# Patient Record
Sex: Female | Born: 1945 | Race: White | Hispanic: No | Marital: Married | Smoking: Never smoker
Health system: Southern US, Community
[De-identification: ages and names within clinical notes are randomized; demographics above are authoritative.]

## PROBLEM LIST (undated history)

## (undated) DIAGNOSIS — G8929 Other chronic pain: Secondary | ICD-10-CM

## (undated) DIAGNOSIS — Z794 Long term (current) use of insulin: Secondary | ICD-10-CM

## (undated) DIAGNOSIS — L409 Psoriasis, unspecified: Secondary | ICD-10-CM

## (undated) DIAGNOSIS — F329 Major depressive disorder, single episode, unspecified: Secondary | ICD-10-CM

## (undated) DIAGNOSIS — F419 Anxiety disorder, unspecified: Secondary | ICD-10-CM

## (undated) DIAGNOSIS — M549 Dorsalgia, unspecified: Secondary | ICD-10-CM

## (undated) DIAGNOSIS — E785 Hyperlipidemia, unspecified: Secondary | ICD-10-CM

## (undated) DIAGNOSIS — M1990 Unspecified osteoarthritis, unspecified site: Secondary | ICD-10-CM

## (undated) DIAGNOSIS — N3000 Acute cystitis without hematuria: Secondary | ICD-10-CM

## (undated) DIAGNOSIS — E119 Type 2 diabetes mellitus without complications: Secondary | ICD-10-CM

## (undated) DIAGNOSIS — E669 Obesity, unspecified: Secondary | ICD-10-CM

## (undated) DIAGNOSIS — L509 Urticaria, unspecified: Secondary | ICD-10-CM

## (undated) DIAGNOSIS — K219 Gastro-esophageal reflux disease without esophagitis: Secondary | ICD-10-CM

## (undated) DIAGNOSIS — G4710 Hypersomnia, unspecified: Secondary | ICD-10-CM

## (undated) DIAGNOSIS — G4730 Sleep apnea, unspecified: Secondary | ICD-10-CM

## (undated) DIAGNOSIS — Z6841 Body Mass Index (BMI) 40.0 and over, adult: Secondary | ICD-10-CM

## (undated) DIAGNOSIS — G4733 Obstructive sleep apnea (adult) (pediatric): Secondary | ICD-10-CM

## (undated) DIAGNOSIS — I10 Essential (primary) hypertension: Secondary | ICD-10-CM

## (undated) DIAGNOSIS — F32A Depression, unspecified: Secondary | ICD-10-CM

## (undated) HISTORY — DX: Hyperlipidemia, unspecified: E78.5

## (undated) HISTORY — PX: CHOLECYSTECTOMY: SHX55

## (undated) HISTORY — DX: Psoriasis, unspecified: L40.9

## (undated) HISTORY — DX: Major depressive disorder, single episode, unspecified: F32.9

## (undated) HISTORY — DX: Dorsalgia, unspecified: M54.9

## (undated) HISTORY — DX: Acute cystitis without hematuria: N30.00

## (undated) HISTORY — PX: BACK SURGERY: SHX140

## (undated) HISTORY — DX: Obesity, unspecified: E66.9

## (undated) HISTORY — DX: Hypersomnia, unspecified: G47.10

## (undated) HISTORY — DX: Type 2 diabetes mellitus without complications: Z79.4

## (undated) HISTORY — DX: Urticaria, unspecified: L50.9

## (undated) HISTORY — DX: Essential (primary) hypertension: I10

## (undated) HISTORY — DX: Obstructive sleep apnea (adult) (pediatric): G47.33

## (undated) HISTORY — PX: SPINE SURGERY: SHX786

## (undated) HISTORY — PX: ABDOMINAL HYSTERECTOMY: SHX81

## (undated) HISTORY — DX: Type 2 diabetes mellitus without complications: E11.9

## (undated) HISTORY — PX: INCISIONAL BREAST BIOPSY: SHX1812

---

## 1999-07-29 ENCOUNTER — Other Ambulatory Visit: Admission: RE | Admit: 1999-07-29 | Discharge: 1999-07-29 | Payer: Self-pay | Admitting: *Deleted

## 1999-07-30 ENCOUNTER — Encounter (INDEPENDENT_AMBULATORY_CARE_PROVIDER_SITE_OTHER): Payer: Self-pay | Admitting: Specialist

## 1999-07-30 ENCOUNTER — Other Ambulatory Visit: Admission: RE | Admit: 1999-07-30 | Discharge: 1999-07-30 | Payer: Self-pay | Admitting: *Deleted

## 1999-09-15 ENCOUNTER — Inpatient Hospital Stay (HOSPITAL_COMMUNITY): Admission: RE | Admit: 1999-09-15 | Discharge: 1999-09-19 | Payer: Self-pay | Admitting: *Deleted

## 1999-09-15 ENCOUNTER — Encounter (INDEPENDENT_AMBULATORY_CARE_PROVIDER_SITE_OTHER): Payer: Self-pay

## 1999-09-15 ENCOUNTER — Encounter (INDEPENDENT_AMBULATORY_CARE_PROVIDER_SITE_OTHER): Payer: Self-pay | Admitting: Specialist

## 2000-01-15 ENCOUNTER — Other Ambulatory Visit: Admission: RE | Admit: 2000-01-15 | Discharge: 2000-01-15 | Payer: Self-pay | Admitting: *Deleted

## 2000-04-26 ENCOUNTER — Other Ambulatory Visit: Admission: RE | Admit: 2000-04-26 | Discharge: 2000-04-26 | Payer: Self-pay | Admitting: *Deleted

## 2001-01-04 ENCOUNTER — Other Ambulatory Visit: Admission: RE | Admit: 2001-01-04 | Discharge: 2001-01-04 | Payer: Self-pay | Admitting: *Deleted

## 2001-05-11 ENCOUNTER — Other Ambulatory Visit: Admission: RE | Admit: 2001-05-11 | Discharge: 2001-05-11 | Payer: Self-pay | Admitting: *Deleted

## 2001-09-26 ENCOUNTER — Other Ambulatory Visit: Admission: RE | Admit: 2001-09-26 | Discharge: 2001-09-26 | Payer: Self-pay | Admitting: *Deleted

## 2001-10-18 ENCOUNTER — Encounter: Payer: Self-pay | Admitting: Family Medicine

## 2001-10-18 ENCOUNTER — Encounter: Admission: RE | Admit: 2001-10-18 | Discharge: 2001-10-18 | Payer: Self-pay | Admitting: Family Medicine

## 2003-01-29 ENCOUNTER — Emergency Department (HOSPITAL_COMMUNITY): Admission: EM | Admit: 2003-01-29 | Discharge: 2003-01-30 | Payer: Self-pay | Admitting: Emergency Medicine

## 2004-09-12 ENCOUNTER — Emergency Department (HOSPITAL_COMMUNITY): Admission: EM | Admit: 2004-09-12 | Discharge: 2004-09-12 | Payer: Self-pay | Admitting: Emergency Medicine

## 2004-12-04 ENCOUNTER — Ambulatory Visit: Payer: Self-pay | Admitting: Gastroenterology

## 2004-12-04 ENCOUNTER — Ambulatory Visit (HOSPITAL_COMMUNITY): Admission: RE | Admit: 2004-12-04 | Discharge: 2004-12-05 | Payer: Self-pay | Admitting: General Surgery

## 2004-12-04 ENCOUNTER — Encounter (INDEPENDENT_AMBULATORY_CARE_PROVIDER_SITE_OTHER): Payer: Self-pay | Admitting: Specialist

## 2007-03-24 ENCOUNTER — Ambulatory Visit: Payer: Self-pay | Admitting: Pulmonary Disease

## 2007-03-24 DIAGNOSIS — M549 Dorsalgia, unspecified: Secondary | ICD-10-CM

## 2007-03-24 DIAGNOSIS — G4710 Hypersomnia, unspecified: Secondary | ICD-10-CM | POA: Insufficient documentation

## 2007-03-24 DIAGNOSIS — F329 Major depressive disorder, single episode, unspecified: Secondary | ICD-10-CM

## 2007-03-24 DIAGNOSIS — F3289 Other specified depressive episodes: Secondary | ICD-10-CM

## 2007-03-24 HISTORY — DX: Other specified depressive episodes: F32.89

## 2007-03-24 HISTORY — DX: Dorsalgia, unspecified: M54.9

## 2007-03-24 HISTORY — DX: Hypersomnia, unspecified: G47.10

## 2007-03-24 HISTORY — DX: Major depressive disorder, single episode, unspecified: F32.9

## 2007-03-25 ENCOUNTER — Ambulatory Visit (HOSPITAL_BASED_OUTPATIENT_CLINIC_OR_DEPARTMENT_OTHER): Admission: RE | Admit: 2007-03-25 | Discharge: 2007-03-25 | Payer: Self-pay | Admitting: Pulmonary Disease

## 2007-04-19 ENCOUNTER — Telehealth (INDEPENDENT_AMBULATORY_CARE_PROVIDER_SITE_OTHER): Payer: Self-pay | Admitting: *Deleted

## 2007-05-02 ENCOUNTER — Ambulatory Visit: Payer: Self-pay | Admitting: Pulmonary Disease

## 2007-05-10 ENCOUNTER — Telehealth (INDEPENDENT_AMBULATORY_CARE_PROVIDER_SITE_OTHER): Payer: Self-pay | Admitting: *Deleted

## 2007-05-17 ENCOUNTER — Ambulatory Visit: Payer: Self-pay | Admitting: Pulmonary Disease

## 2007-05-17 DIAGNOSIS — G4733 Obstructive sleep apnea (adult) (pediatric): Secondary | ICD-10-CM

## 2007-05-17 HISTORY — DX: Obstructive sleep apnea (adult) (pediatric): G47.33

## 2007-06-08 ENCOUNTER — Encounter: Payer: Self-pay | Admitting: Pulmonary Disease

## 2007-06-14 ENCOUNTER — Ambulatory Visit: Payer: Self-pay | Admitting: Pulmonary Disease

## 2007-07-25 ENCOUNTER — Telehealth (INDEPENDENT_AMBULATORY_CARE_PROVIDER_SITE_OTHER): Payer: Self-pay | Admitting: *Deleted

## 2007-07-26 ENCOUNTER — Encounter: Payer: Self-pay | Admitting: Pulmonary Disease

## 2009-06-29 ENCOUNTER — Emergency Department (HOSPITAL_COMMUNITY): Admission: EM | Admit: 2009-06-29 | Discharge: 2009-06-29 | Payer: Self-pay | Admitting: Emergency Medicine

## 2010-05-19 LAB — URINALYSIS, ROUTINE W REFLEX MICROSCOPIC
Glucose, UA: NEGATIVE mg/dL
Protein, ur: >300 mg/dL — AB

## 2010-05-19 LAB — DIFFERENTIAL
Basophils Absolute: 0 10*3/uL (ref 0.0–0.1)
Lymphocytes Relative: 16 % (ref 12–46)
Lymphs Abs: 2 10*3/uL (ref 0.7–4.0)
Monocytes Absolute: 1 10*3/uL (ref 0.1–1.0)
Monocytes Relative: 8 % (ref 3–12)
Neutro Abs: 9.5 10*3/uL — ABNORMAL HIGH (ref 1.7–7.7)

## 2010-05-19 LAB — URINE CULTURE: Colony Count: 35000

## 2010-05-19 LAB — URINE MICROSCOPIC-ADD ON

## 2010-05-19 LAB — CBC
Hemoglobin: 14.4 g/dL (ref 12.0–15.0)
RBC: 4.71 MIL/uL (ref 3.87–5.11)
WBC: 13 10*3/uL — ABNORMAL HIGH (ref 4.0–10.5)

## 2010-05-19 LAB — POCT I-STAT, CHEM 8
BUN: 22 mg/dL (ref 6–23)
Hemoglobin: 15.6 g/dL — ABNORMAL HIGH (ref 12.0–15.0)
Sodium: 140 mEq/L (ref 135–145)
TCO2: 21 mmol/L (ref 0–100)

## 2010-05-19 LAB — APTT: aPTT: 42 seconds — ABNORMAL HIGH (ref 24–37)

## 2010-07-14 NOTE — Procedures (Signed)
Kaitlin Berry, Kaitlin Berry              ACCOUNT NO.:  [id number]   MEDICAL RECORD NO.:  [id number]          PATIENT TYPE:  OUT   LOCATION:  SLEEP CENTER                 FACILITY:  [facility]   PHYSICIAN:  Kathee Delton, MD,FCCPDATE OF BIRTH:  06-03-45   DATE OF STUDY:  05/02/2007                            NOCTURNAL POLYSOMNOGRAM   REFERRING PHYSICIAN:   LOCATION:  Sleep lab.   REFERRING PHYSICIAN:  Danton Sewer, M.D.   INDICATIONS FOR THE STUDY:  Hypersomnia with sleep apnea.   EPWORTH SLEEPINESS SCORE:  13.   SLEEP ARCHITECTURE:  Patient had a total sleep time of 287 minutes with  very little slow wave sleep or REM noted.  Sleep onset latency was very  prolonged at 118 minutes, and REM onset was very high at 288 minutes.  Sleep efficiency was very poor at 55%.   RESPIRATORY DATA:  Patient was found to have 6 obstructive apneas and  162 hypopneas for an apnea-hypopnea index of 35 events per hour.  The  events were not positional, and there was moderate-to-loud snoring  throughout.   OXYGEN DATA:  The patient was found to have O2 desaturation as low as  81% with her obstructive events.   CARDIAC DATA:  Rare PACs were noted, but no clinically significant  arrhythmia.   MOVEMENT-PARASOMNIA:  None.   IMPRESSIONS-RECOMMENDATIONS:  1. Moderate-to-severe obstructive sleep apnea/hypopnea syndrome with      an apnea-hypopnea index of 35 events per hour and oxygen      desaturation as low as 81%.  Treatment for this degree of sleep      apnea should focus      primarily on weight loss as well as CPAP.  2. Rare premature atrial contractions but no clinically significant      arrhythmias were noted.      Kathee Delton, MD,FCCP  [place], [place] Board of Sleep  Medicine  Electronically Signed     [facility]/MEDQ  D:  05/05/2007 14:12:30  T:  05/05/2007 20:31:59  Job:  [id number]

## 2010-07-17 NOTE — Discharge Summary (Signed)
[facility]  Patient:    Kaitlin Berry, Kaitlin Berry                     MRN: [id number] Adm. Date:  [id number] Disc. Date: [id number] Attending:  Adela Lank CC:         Freddie Apley, M.D.             Domingo Pulse, M.D.                           Discharge Summary  ADMISSION DIAGNOSES: 1. Adenocarcinoma of the endometrium. 2. Stress urinary incontinence.  HISTORY:  For details of the patients history and physical, please see the note dated September 14, 1999, which was the date of admission.  Briefly, the patient is 65 years old, she has a history of simple and complex hyperplasia and an office biopsy returned with adenocarcinoma of the endometrium.  She had been complaining of stress urinary incontinence and was seen by Dr. Domingo Pulse prior to surgery.  The decision was made to remove her uterus for treatment of her endometrial cancer and if stable in the operating room, to proceed with a urethral suspension procedure.  On the day of the patients admission, she was taken to the operating room and under general anesthesia, total abdominal hysterectomy, bilateral salpingo-oophorectomy and Burch suspension were performed; Dr. Lawrence Santiago performed the latter procedure with my assistance.  Frozen section diagnosis of the uterus showed a superficially invasive tumor.  For that reason, pelvic and para-aortic node sampling was not performed.  She did have peritoneal washings and exploration performed.  There were no intraoperative complications.  HOSPITAL COURSE:  On the evening of her surgery, the patient was alert and conversant with good pain control.  She was using PCA morphine for pain control and had no evidence of nausea.  She was using her PCA adequately.  On the morning of postoperative day #1, she had no complaints.  She has had an episode of anxiety the night before which responded well to Ativan.  Her hemoglobin was stable at 10.5 on this  day.  She had an O2 saturation of 98% on 2 L.  Her oxygen was tapered to room air and she was asked to get out of bed, which she did with good results.  On postoperative day #2, she had a temperature to 100.8 and on rounds, it was 99.9.  Her lungs were clear.  There was no CVA tenderness.  Room O2 saturation was 94%.  Her wound looked good. Hemoglobin was 9.7.  That afternoon, she passed flatus and was able to begin on full liquids and p.o. pain medication.  That evening, she spiked a temperature to 101.1 but was afebrile on postoperative day #3 on morning rounds.  Her incision looked good.  Her lungs were clear.  Hemoglobin had decreased at this point to 8.6.  Pathology had returned showing a superficial invasion of her endometrial cancer.  The patient was clinically stable but with her hemoglobin drop and her temperature spike, she needed to be observed for another day.  On that day, she was seen by Dr. Amalia Hailey and decision was made to remove her Foley catheter.  She was able to void with minimal difficulty, although she did complain significant of bladder spasms.  At the time of her first in-and-out catheterization, a urine was obtained and sent for urinalysis.  This urinalysis was consistent with  a urinary tract infection. Because of her continued complaints of spasm with voiding, she was started on Macrobid one p.o. b.i.d. and Pyridium 100 mg b.i.d.  Hemoglobin on postoperative day #4 returned as stable with a hematocrit of 26%.  She was seen on the morning of postoperative day #4 by Dr. Lillette Boxer. D[place]t.  He felt that it was okay for her to be discharged and she was discharged on this morning.  She was discharged without a Foley catheter in place and with instructions regarding voiding.  An appointment has been made for her to come to my office for staple removal.  She will be seen by Dr. Amalia Hailey for evaluation of her bladder on his schedule.  DISCHARGE MEDICATIONS: 1. Tylox one  every four hours for pain. 2. Peri-Colace one daily while on Tylox to prevent constipation. 3. Vivelle 0.05 mg for estrogen replacement. 4. Chromagen one daily for anemia. 5. Macrobid one p.o. b.i.d. x 7 for urinary tract infection. 6. Pyridium 100 mg q.8h. for bladder spasm.  DISCHARGE INSTRUCTIONS:  Urine C&S is pending at the time of this discharge. Once we obtain this value, we will let her know the results and we may need to switch her antibiotics.  FINAL DIAGNOSES: 1. Stage IB adenocarcinoma of the endometrium. 2. Stress urinary incontinence.  PROCEDURES:  Total abdominal hysterectomy, bilateral salpingo-oophorectomy with surgical staging by exploration and Burch urethral suspension. DD:  09/19/98 TD:  09/22/99 Job: [id number]

## 2010-07-17 NOTE — Op Note (Signed)
Kaitlin Berry, Kaitlin Berry              ACCOUNT NO.:  [id number]   MEDICAL RECORD NO.:  [id number]          PATIENT TYPE:  OIB   LOCATION:  [id number]                         FACILITY:  [facility]   PHYSICIAN:  Gwenyth Ober, M.D.    DATE OF BIRTH:  [birth year]   DATE OF PROCEDURE:  12/04/2004  DATE OF DISCHARGE:                                 OPERATIVE REPORT   PREOPERATIVE DIAGNOSIS:  Symptomatic cholelithiasis with a dilated common  bile duct.   POSTOPERATIVE DIAGNOSIS:  Symptomatic cholelithiasis with a dilated common  bile duct.   PROCEDURE:  Laparoscopic cholecystectomy with cholangiogram.   SURGEON:  Gwenyth Ober, M.D.   ANESTHESIA:  General endotracheal.   ESTIMATED BLOOD LOSS:  Less than 20 cc.   COMPLICATIONS:  None.   CONDITION:  Stable.   FINDINGS:  The patient's cholangiogram showed a dilated common duct with  retrograde filling of the pancreatic duct, delayed flow into the duodenum,  however, it did trickle into it.  No definite intraductal filling defects.   The patient also had extensive omental adhesions to the anterior abdominal  wall and around the umbilicus making a Hassan entry technique a very  worthwhile.   INDICATIONS:  The patient is a   OPERATION:   INDICATIONS FOR OPERATION:  The patient is a 65 year old with symptomatic  cholelithiasis and comes in now for laparoscopic cholecystectomy.   DESCRIPTION OF PROCEDURE:  The patient was taken to the operating room,  placed on the table in supine position.  After an adequate endotracheal  anesthetic was administered, she was prepped and draped in usual sterile  manner exposing the midline and the abdomen.   A supraumbilical curvilinear incision was made using #11 blade and taken  down to the midline fascia.  This midline fascia was grabbed with a Kocher  clamps and an incision made in the fascia between the two clamps.  We then  bluntly dissected out into the peritoneal cavity through the fascial  incision  and cleared it for placement of a pursestring suture of O Vicryl.  Once the 0 Vicryl was in place, we used the Hasson cannula to pass into the  peritoneal cavity, confirming its position with the camera, laparoscope and  attached camera light source.  This did confirm placement.  However, there  were obvious omental attachments near the placement of the cannula.  Further  delineation required placement of the subxiphoid cannula and the 5 mm  cannulas.   We next placed a subxiphoid 12 mm cannula the direct vision.  This allowed  [place] to place the laparoscope and attached camera light source through the  subxiphoid cannula and visualize the periumbilical adhesions.  There were  multiple adhesions of omentum to the anterior abdominal wall around the  cannulae which we able to clear with electrocautery endoscopic scissors and  blunt and sharp dissection.  We were able to adequately cleared these  adhesions from the cannula site.  We did not go down into the pelvis as this  was not required for the laparoscopic gallbladder procedure.   Once we had these adhesions cleared, we  placed a cannula and the camera back  through the supraumbilical cannula and then placed two 5 mm ports in the  left upper quadrant. Once these were done, we placed the patient in reverse  Trendelenburg. The left side was tilted down and the dissection begun.   The dome of the gallbladder was wrapped using a ratcheted grasper through  the lateral-most 5-mm cannula. We retracted towards the anterior abdominal  wall and right upper quadrant.  This exposed the infundibulum which was  grasped with a second grasper and ratcheted with grasper and then exposing  the peritoneum overlying the triangle of Fallot and hepatic duodenal  triangle.  We were able to easily dissect out the cystic duct and cystic  artery.  We ligated the cystic artery proximally and distally and transected  the cystic duct.  Once it was adequately exposed  with a circumferential  window, we had an  Endoclip placed on the gallbladder side and then a  cholecysto-ducotomy made using  laparoscopic scissors.  It was through this  cholecysto-ducotomy that the [facility] catheter which had been passed through the  anterior abdominal wall was passed in order to perform the cholangiogram.  This was done with minimal difficulty and it did show an dilated common duct  and slow flow into the duodenum and retrograde filling of the pancreatic  duct and but no distal ductal filling defects.  We subsequently removed the  cannula and placed the patient back in reverse Trendelenburg left-side down,  removed the cholangiocatheter, clipped the distal cystic duct x4 and  transected it.  There was several posterior branches of the cystic artery  coming from the liver which we did Endoclip as dissected out the gallbladder  from its bed.  This was done with minimal difficulty and without entering  into the gallbladder itself.  We then used an EndoCatch bag in order to pass  the gallbladder and bring it out from the supraumbilical site.  It came out  easily and we were able to close off the site with a pursestring suture  which was then placed to hold the [facility] cannula.  We irrigated with about a  liter of saline, providing adequate hemostasis with cautery and then we  aspirated all gas and fluid.   The patient was flattened out and placed in a neutral position.  We injected  all cannula sites with 0.25% Marcaine with epinephrine.  We then closed all  the sites using running subcuticular stitch of 4-0 Vicryl.  Sterile  dressings were applied.  All needle counts, sponge counts and instrument  counts were correct.      Gwenyth Ober, M.D.  Electronically Signed     JOW/MEDQ  D:  12/04/2004  T:  12/04/2004  Job:  [id number]

## 2010-07-17 NOTE — Op Note (Signed)
[facility]  Patient:    Kaitlin Berry, Kaitlin Berry                     MRN: [id number] Proc. Date: 09/15/99 Adm. Date:  [id number] Attending:  Adela Lank CC:         Domingo Pulse, M.D.             Freddie Apley, M.D.             Lovenia Kim, M.D.                           Operative Report  PREOPERATIVE DIAGNOSIS: 1. Adenocarcinoma of the endometrium. 2. Stress urinary incontinence.  POSTOPERATIVE DIAGNOSIS: 1. Stage 1B adenocarcinoma of the endometrium. 2. Stress urinary incontinence.  OPERATION PERFORMED:  Exploratory laparotomy, total abdominal hysterectomy, bilateral salpingo-oophorectomy.  CONSULTING COSURGEON:  Domingo Pulse, M.D. for Burke Keels procedure.  ASSISTANT SURGEON:  Lovenia Kim, M.D. for the TAH/BSO.  SURGEON:  Freddie Apley, M.D.  ANESTHESIA:  INDICATIONS FOR PROCEDURE:  The patient is a 65 year old female who has a history of hyperplasia.  A recent episode of bleeding prompted an endometrial biopsy which showed a well-differentiated endometrial carcinoma.  The patient was brought to the operating room today for staging of this procedure as well as hysterectomy in attempt to cure.  OPERATIVE FINDINGS:  No ascites, no peritoneal lymph nodes, no enlarged lymph nodes.  The uterus was 10 weeks in size.  Both ovaries were normal in appearance.  There were no significant adhesions.  DESCRIPTION OF PROCEDURE:  Kaitlin Berry was brought to the operating room with an IV in place.  She received a gram of Ancef in the holding area.  Thigh high PAS stockings were placed on her lower extremities.  The patient was placed supine on the operating table and general endotracheal anesthesia was administered without difficulty.  She was then placed in a frogleg position and the anterior abdominal wall, perineum and vagina were prepped with Hibiclens solution.  The patient was draped for a midline incision.   An infraumbilical incision was made through the skin and carried down to the symphysis.  Subcutaneous tissues were separated in the midline, fascia was scored and opened.  The rectus muscles were divided in the midline.  The peritoneum was tented.  The peritoneum was opened and the anterior wall was raised.  500 cc of warm saline was instilled and collected as peritoneal washings.  Exploratory laparotomy was then performed.  Self-retaining retractor was used to retract the peritoneal walls and muscles.  At this point the fascial incision was extended inferiorly until the symphysis was reached. Warm packs were used to place the bowel contents into the upper abdomen.  It should be noted that the patient had a normal-appearing retrocecal appendix.  Long Kelly clamps were placed along the adnexal structures.  Round ligaments were identified, suture ligated and transected with cautery.  IP ligaments were skeletonized and then clamped, cut, suture and free tie ligated with 0 Vicryl suture.  The posterior broad ligament was incised bringing the ovaries up onto the uterine surface.  The uterine arteries were skeletonized.  The peritoneum overlying the lower uterine segment was incised and then separated from the lower uterine segment and cervix by sharp dissection.  The uterine arteries were doubly clamped and doubly suture ligated such that the second tie passed around the first, doubly ligating as well.  Straight Masterson clamps were used to separate the lower uterine segment and cervix from the cardinal ligament.  These sutures were Heaney ligated.  Uterosacral ligaments were divided as individual structures and held for later in the case.  We entered the vagina on the patients right.  An angle suture was placed here securing the vaginal mucosa.  The left angle was also secured and the specimen was then excised with Satinsky scissors.  Anterior and posterior vagina were held.  Later in the  case the vaginal cuff was whipped with a 0 Vicryl suture. Angled sutures were placed prior to this whipping procedure.  The anterior and posterior vaginas were brought together with a single suture.  The hysterectomy specimen was taken to the pathology lab where frozen section diagnosis revealed an invasive carcinoma but only superficially invasive.  The The peritoneal cavity was inspected and hemostasis was obtained by cautery. At this point, the side walls of the pelvis were opened and the vessels were visualized.  I looked carefully for any enlarged lymph nodes along the external iliac vessels or in the obturator fossa.  There was no evidence of nodal enlargement.  No lymph nodes were biopsied.  Upper abdominal exploration earlier in the case had shown a smooth diaphragm, smooth liver surfaces and no evidence of periaortic nodes although this was difficult to tell in a patient with this significance of obesity.  At this point in the case, Dr. Alona Bene came into the operating room and performed Burch paravaginal repair.  Please see his dictated note for the description of this procedure.  At the completion of Dr. Rayann Heman procedure, the peritoneal cavity was inspected carefully.  All the laps were removed.  Sponge and instrument counts were correct.  The fascia was closed with a running double stranded Prolene suture suturing superiorly and inferiorly and tying in the middle. Subcutaneous tissues were irrigated.  Skin staples were applied.  Estimated blood loss for this procedure was 500 cc.  Urine output was 400 cc.  Fluids were 5L of crystalloid in 250 cc of Hespan.  COMPLICATIONS:  None. DD:  09/15/99 TD:  09/16/99 Job: [id number]

## 2010-07-17 NOTE — Op Note (Signed)
[facility]  Patient:    Kaitlin Berry, Kaitlin Berry                     MRN: [id number] Proc. Date: 09/15/99 Adm. Date:  [id number] Attending:  Adela Lank CC:         Freddie Apley, M.D.                           Operative Report  PREOPERATIVE DIAGNOSES:  Stress urinary incontinence and cystocele.  POSTOPERATIVE DIAGNOSES:  Stress urinary incontinence and cystocele.  PROCEDURE:  Burch bladder suspension, paravaginal repair of cystocele, and cystoscopy.  SURGEON:  Domingo Pulse, M.D.  ASSISTANT:  Freddie Apley, M.D.  ANESTHESIA:  General.  COMPLICATIONS:  None.  DRAIN:  A 16 French Foley catheter.  BRIEF HISTORY:  This 65 year old female is scheduled to undergo a radical hysterectomy with possible node dissection.  The patient has had uterine decensus and stress urinary incontinence.  She has requested that these be repaired at the same time.  The patient is not felt to have a need for a vaginal vault suspension and sacropexy is not indicated. It is felt this can be repaired transabdominally so that a vaginal approach will not be required. The patient understands the risks and benefits of the procedure including the possibility of postoperative retention requiring prolonged catheterization, as well as the possibility that she may need to have additional surgery if she is either under or over corrected.  She gave full and informed consent.  DESCRIPTION OF PROCEDURE:  The patient underwent a radical hysterectomy and was felt to have minimally invasive tumor.  For that reason, Dr. Kathyrn Drown did not perform a standard node dissection.  The patient was found with the bdomen opened to the midline incision and the operative field dry.  The space of Retzius was developed bilaterally with blunt dissection exposing the bladder.  The patient was found to have prolapse and poor support at the level of the urethra.  The vagina had been prepped at  the beginning of the case.  A finger in the vagina was used to delineate the periurethral tissue.  Blunt dissection with a sponge stick was used to expose the glistening vaginal tissue.  On each side three bound sutures of 0 Prolene were doubly placed into this tissue.  On each side they were then sutured up at the Coopers ligament and then the outer wall of the shelving edge.  The sutures were tied up with what was felt to be an appropriate degree of tension with elevation of bladder as they were tied.  Cystoscopy was performed.  The bladder was carefully inspected.  It was free of any tumor or stones.  There did appear to be good support for the urethra and bladder neck.  The ureteral orifices did not appear to be injured in any way.  Urine was seen to efflux from each.  There was no suture material anywhere within the bladder.  The bladder was filled and with ______ there was prompt flow of urine.  The Foley catheter was reinserted and drained.  The patient was then closed by Dr. Kathyrn Drown.  She will dictate that portion of the procedure. DD:  09/15/99 TD:  09/16/99 Job: [id number]

## 2011-04-22 ENCOUNTER — Ambulatory Visit (INDEPENDENT_AMBULATORY_CARE_PROVIDER_SITE_OTHER): Payer: BC Managed Care – PPO | Admitting: Family Medicine

## 2011-04-22 VITALS — BP 168/77 | HR 75 | Temp 97.6°F | Resp 18 | Ht 60.5 in | Wt 244.8 lb

## 2011-04-22 DIAGNOSIS — R300 Dysuria: Secondary | ICD-10-CM

## 2011-04-22 DIAGNOSIS — E669 Obesity, unspecified: Secondary | ICD-10-CM

## 2011-04-22 DIAGNOSIS — N390 Urinary tract infection, site not specified: Secondary | ICD-10-CM

## 2011-04-22 HISTORY — DX: Obesity, unspecified: E66.9

## 2011-04-22 LAB — POCT UA - MICROSCOPIC ONLY: Yeast, UA: NEGATIVE

## 2011-04-22 LAB — POCT URINALYSIS DIPSTICK
Nitrite, UA: POSITIVE
Urobilinogen, UA: 1

## 2011-04-22 MED ORDER — CIPROFLOXACIN HCL 500 MG PO TABS: 500.0000 mg | ORAL_TABLET | Freq: Two times a day (BID) | ORAL | Status: AC

## 2011-04-22 NOTE — Progress Notes (Signed)
  Patient Name: Kaitlin Berry Date of Birth: [birth year] Medical Record Number: [id number] Gender: female Date of Encounter: 04/22/2011  History of Present Illness:  Kaitlin Berry is a 66 y.o. very pleasant female patient who presents with the following:  Concern re: UTI. She has noticed symptoms for the last 4 days.  Dysuria, frequency, no hematuria this time.  No fever, N/V or back pain.  Otherwise is doing well  Patient Active Problem List  Diagnoses  . DEPRESSION  . OBSTRUCTIVE SLEEP APNEA  . BACK PAIN  . HYPERSOMNIA  . Obesity   No past medical history on file. No past surgical history on file. History  Substance Use Topics  . Smoking status: Never Smoker   . Smokeless tobacco: Not on file  . Alcohol Use: Not on file   No family history on file. No Known Allergies  Medication list has been reviewed and updated.  Review of Systems: As per HPI- otherwise negative.  Physical Examination: Filed Vitals:   04/22/11 1850  BP: 168/77  Pulse: 75  Temp: 97.6 F (36.4 C)  TempSrc: Oral  Resp: 18  Height: 5' 0.5" (1.537 m)  Weight: 244 lb 12.8 oz (111.041 kg)    Body mass index is 47.02 kg/(m^2).  GEN: WDWN, NAD, Non-toxic, A & O x 3, obese HEENT: Atraumatic, Normocephalic. Neck supple. No masses, No LAD. Ears and Nose: No external deformity. CV: RRR, No M/G/R. No JVD. No thrill. No extra heart sounds. PULM: CTA B, no wheezes, crackles, rhonchi. No retractions. No resp. distress. No accessory muscle use. ABD: S, NT, ND, +BS. No rebound. No HSM.  No CVA tenderness EXTR: No c/c/e NEURO Normal gait.  PSYCH: Normally interactive. Conversant. Not depressed or anxious appearing.  Calm demeanor.    Results for orders placed in visit on 04/22/11  POCT URINALYSIS DIPSTICK      Component Value Range   Color, UA yellow     Clarity, UA cloudy     Glucose, UA negative     Bilirubin, UA negative     Ketones, UA trace     Spec Grav, UA 1.020     Blood, UA negative       pH, UA 5.0     Protein, UA trace     Urobilinogen, UA 1.0     Nitrite, UA positive     Leukocytes, UA small (1+)    POCT UA - MICROSCOPIC ONLY      Component Value Range   WBC, Ur, HPF, POC 20-30     RBC, urine, microscopic 2-4     Bacteria, U Microscopic 1+     Mucus, UA negative     Epithelial cells, urine per micros 3-6     Crystals, Ur, HPF, POC negative     Casts, Ur, LPF, POC negative     Yeast, UA negative      Assessment and Plan: 1. UTI (lower urinary tract infection)    2. Dysuria  Urinalysis Dipstick, POCT UA - Microscopic Only  3. Obesity     Cipro 500 BID for 5 days prescripbed- per patient she tolerates Cipro best.  Urine culture pending

## 2011-04-24 LAB — URINE CULTURE: Colony Count: NO GROWTH

## 2011-11-27 ENCOUNTER — Ambulatory Visit (INDEPENDENT_AMBULATORY_CARE_PROVIDER_SITE_OTHER): Payer: BC Managed Care – PPO | Admitting: Emergency Medicine

## 2011-11-27 VITALS — BP 172/108 | HR 98 | Temp 97.9°F | Resp 20 | Ht 62.0 in | Wt 264.0 lb

## 2011-11-27 DIAGNOSIS — R300 Dysuria: Secondary | ICD-10-CM

## 2011-11-27 DIAGNOSIS — N3000 Acute cystitis without hematuria: Secondary | ICD-10-CM

## 2011-11-27 DIAGNOSIS — L509 Urticaria, unspecified: Secondary | ICD-10-CM

## 2011-11-27 DIAGNOSIS — Z23 Encounter for immunization: Secondary | ICD-10-CM

## 2011-11-27 HISTORY — DX: Acute cystitis without hematuria: N30.00

## 2011-11-27 HISTORY — DX: Urticaria, unspecified: L50.9

## 2011-11-27 LAB — POCT URINALYSIS DIPSTICK
Bilirubin, UA: NEGATIVE
Clarity, UA: NEGATIVE
Nitrite, UA: POSITIVE
Protein, UA: 30

## 2011-11-27 LAB — POCT UA - MICROSCOPIC ONLY

## 2011-11-27 MED ORDER — PHENAZOPYRIDINE HCL 200 MG PO TABS: 200.0000 mg | ORAL_TABLET | Freq: Three times a day (TID) | ORAL | Status: DC | PRN

## 2011-11-27 MED ORDER — NITROFURANTOIN MONOHYD MACRO 100 MG PO CAPS: 100.0000 mg | ORAL_CAPSULE | Freq: Two times a day (BID) | ORAL | Status: DC

## 2011-11-27 NOTE — Progress Notes (Signed)
[facility] [address], [place] [address] [phone]  Date:  11/27/2011   Name:  Kaitlin Berry   DOB:  05-18-1945   MRN:  [id number]  PCP:  Woody Seller, MD    Chief Complaint: Dysuria, Urinary Frequency and Urticaria   History of Present Illness:  Kaitlin Berry is a 66 y.o. very pleasant female patient who presents with the following:  Dysuria, urgency, and frequency.  No fever or chills, nausea or vomiting, stool change, vaginal discharge or dyspareunia.  Symptoms started two days ago.  Took a left over cipro last night and today has awakened with a diffuse erythematous rash.  No shortness of breath or wheezing. No prior history of allergy.  Tolerates cipro well normally.  Patient Active Problem List  Diagnosis  . DEPRESSION  . OBSTRUCTIVE SLEEP APNEA  . BACK PAIN  . HYPERSOMNIA  . Obesity    No past medical history on file.  No past surgical history on file.  History  Substance Use Topics  . Smoking status: Never Smoker   . Smokeless tobacco: Not on file  . Alcohol Use: Not on file    No family history on file.  No Known Allergies  Medication list has been reviewed and updated.  Current Outpatient Prescriptions on File Prior to Visit  Medication Sig Dispense Refill  . carbamazepine (TEGRETOL) 200 MG tablet Take 200 mg by mouth 2 (two) times daily.      . citalopram (CELEXA) 40 MG tablet Take 40 mg by mouth daily.      . metFORMIN (GLUMETZA) 1000 MG (MOD) 24 hr tablet Take 1,000 mg by mouth daily with breakfast.      . gabapentin (NEURONTIN) 300 MG capsule Take 300 mg by mouth 1 day or 1 dose.        Review of Systems:  As per HPI, otherwise negative.    Physical Examination: Filed Vitals:   11/27/11 0847  BP: 172/108  Pulse: 98  Temp: 97.9 F (36.6 C)  Resp: 20   Filed Vitals:   11/27/11 0847  Height: 5\' 2"  (1.575 m)  Weight: 264 lb (119.75 kg)   Body mass index is 48.29 kg/(m^2). Ideal Body Weight:  Weight in (lb) to have BMI = 25: 136.4   GEN: WDWN, NAD, Non-toxic, A & O x 3 HEENT: Atraumatic, Normocephalic. Neck supple. No masses, No LAD. Ears and Nose: No external deformity. CV: RRR, No M/G/R. No JVD. No thrill. No extra heart sounds. PULM: CTA B, no wheezes, crackles, rhonchi. No retractions. No resp. distress. No accessory muscle use. ABD: S, NT, ND, +BS. No rebound. No HSM. EXTR: No c/c/e NEURO Normal gait.  PSYCH: Normally interactive. Conversant. Not depressed or anxious appearing.  Calm demeanor.  SKIN:  Urticaria  Assessment and Plan: Urticaria, likely allergy to cipro.  Benadryl  Follow up immediately for respiratory distress or failure to respond. Acute cystitis macrobid pyridium  Roselee Culver, MD  I have reviewed and agree with documentation. Robert P. Laney Pastor, M.D.

## 2011-11-30 ENCOUNTER — Telehealth: Payer: Self-pay

## 2011-11-30 MED ORDER — CIPROFLOXACIN HCL 500 MG PO TABS: 500.0000 mg | ORAL_TABLET | Freq: Two times a day (BID) | ORAL | Status: DC

## 2011-11-30 NOTE — Telephone Encounter (Signed)
The patient saw Dr. Ouida Sills on 11/27/11 for UTI and was rx'd Macrobid.  The patient states that the Macrobid is making her very sick and she would like another medication prescribed for her UTI.  Please call [phone].

## 2011-11-30 NOTE — Telephone Encounter (Signed)
Rx for Cipro sent to the pharmacy. Follow up if symptoms persist

## 2011-12-01 NOTE — Telephone Encounter (Signed)
Spoke with pt advised Rx sent to pharmacy. 

## 2012-12-18 ENCOUNTER — Ambulatory Visit (INDEPENDENT_AMBULATORY_CARE_PROVIDER_SITE_OTHER): Payer: BC Managed Care – PPO | Admitting: Internal Medicine

## 2012-12-18 ENCOUNTER — Ambulatory Visit: Payer: BC Managed Care – PPO

## 2012-12-18 VITALS — BP 162/70 | HR 64 | Temp 98.0°F | Resp 16 | Ht 64.0 in | Wt 281.0 lb

## 2012-12-18 DIAGNOSIS — M1710 Unilateral primary osteoarthritis, unspecified knee: Secondary | ICD-10-CM

## 2012-12-18 DIAGNOSIS — M25569 Pain in unspecified knee: Secondary | ICD-10-CM

## 2012-12-18 DIAGNOSIS — M25561 Pain in right knee: Secondary | ICD-10-CM

## 2012-12-18 DIAGNOSIS — M1711 Unilateral primary osteoarthritis, right knee: Secondary | ICD-10-CM

## 2012-12-18 NOTE — Progress Notes (Signed)
  Subjective:    Patient ID: Kaitlin Berry, female    DOB: Jan 12, 1946, 67 y.o.   MRN: [id number]  HPI 67 year old female, complains of pain in both knees R>L getting steadily worse over the past 2-3 yrs. Walking, stairs, and standing from the sitting position is very difficult. Pt notes little or no swelling in R knee and no redness. . Pt states she is in agony. Was seen here 2 yr ago for R knee with mild degen changes and responded to injection-did not F/U  Pt notes swelling in both ankles/edema for which she takes lasix.  Patient Active Problem List   Diagnosis Date Noted  . Acute cystitis 11/27/2011  . Urticaria 11/27/2011  . Morbid obesity 11/27/2011  . Obesity 04/22/2011  . OBSTRUCTIVE SLEEP APNEA 05/17/2007  . DEPRESSION 03/24/2007  . BACK PAIN 03/24/2007  . HYPERSOMNIA 03/24/2007    -  AODM  Current outpatient prescriptions:atorvastatin (LIPITOR) 20 MG tablet, Take 20 mg by mouth daily., Disp: , Rfl: ;  citalopram (CELEXA) 40 MG tablet, Take 40 mg by mouth daily., Disp: , Rfl: ;  lisinopril (PRINIVIL,ZESTRIL) 10 MG tablet, Take 10 mg by mouth daily., Disp: , Rfl: ;  metFORMIN (GLUMETZA) 1000 MG (MOD) 24 hr tablet, Take 1,000 mg by mouth daily with breakfast., Disp: , Rfl:  carbamazepine (TEGRETOL) 200 MG tablet, Take 200 mg by mouth 2 (two) times daily., Disp: , Rfl: ;  ciprofloxacin (CIPRO) 500 MG tablet, Take 1 tablet (500 mg total) by mouth 2 (two) times daily., Disp: 6 tablet, Rfl: 0;  gabapentin (NEURONTIN) 300 MG capsule, Take 300 mg by mouth 1 day or 1 dose., Disp: , Rfl:    Review of Systems     Objective:   Physical Exam BP 162/70  Pulse 64  Temp(Src) 98 F (36.7 C) (Oral)  Resp 16  Ht 5\' 4"  (1.626 m)  Wt 281 lb (127.461 kg)  BMI 48.21 kg/m2  SpO2 98%  Tenderness along medial joint line in both knees to light touch with pain on manip and wt bear No laxity Stress MCL nontender R Pain with general ROM R>L No effusion 2+pitting at ankle w/ good pulses     [facility] reading (PRIMARY) by  Dr. Merdith Boyd=R knee with marked degenerative changes especially medially//L with early changes  procedure=under sterile conditions Lidovcaine 3cc plus kenalog injected by lateral approach without problems and with prompt improvement in ability to walk ACE to compress 6 hours Ice and tylenol post procedure Assessment & Plan:  Pain in both knees - Plan: DG Knee 1-2 Views Left, DG Knee 1-2 Views Right  Osteoarthritis of right knee  Morbid Obesity  Needs ortho eval to plan for eventual surgery I recommend seeking approval for LAP BAND to treat her diabetes and to make it possible for successful knee surgery and she will discuss this with her PCP-Dr. Kathryne Berry

## 2014-01-29 ENCOUNTER — Other Ambulatory Visit: Payer: Self-pay | Admitting: Radiology

## 2014-08-29 ENCOUNTER — Other Ambulatory Visit: Payer: Self-pay | Admitting: Radiology

## 2014-09-16 ENCOUNTER — Ambulatory Visit: Payer: Self-pay | Admitting: Surgery

## 2014-09-16 DIAGNOSIS — D241 Benign neoplasm of right breast: Secondary | ICD-10-CM

## 2014-09-16 NOTE — H&P (Signed)
History of Present Illness Kaitlin Berry. Kaitlin Stamey MD; 09/16/2014 9:58 AM) Patient words: breast papilloma right.  The patient is a 69 year old female who presents with a complaint of nipple discharge. PCP Dr. Kathryne Eriksson Referred by Dr. Christene Slates for a right intraductal papilloma This is a 69 year old female who initially presented with several months of clear nipple discharge from the right breast in November 2015. She underwent diagnostic mammogram which was unremarkable. Ultrasound showed a finding at 12:00 anterior depth near the nipple. She underwent ultrasound-guided biopsy of this area in December 2015 which revealed benign breast tissue. She had a six-month follow-up mammogram that showed a dilated duct in the right breast consistent with a papilloma. She underwent a another core biopsy with clip placement on 08/29/14 which revealed an intraductal papilloma. She presents now to discuss lumpectomy. Other Problems (Ammie Eversole, LPN; 624THL QA348G AM) Arthritis Back Pain Bladder Problems Cancer Cholelithiasis Depression Gastroesophageal Reflux Disease Oophorectomy Bilateral. Sleep Apnea  Past Surgical History (Ammie Eversole, LPN; 624THL QA348G AM) Breast Biopsy multiple Gallbladder Surgery - Laparoscopic Hysterectomy (due to cancer) - Complete Oral Surgery Spinal Surgery - Lower Back  Diagnostic Studies History (Ammie Eversole, LPN; 624THL QA348G AM) Colonoscopy never Mammogram within last year Pap Smear >5 years ago  Allergies (Ammie Eversole, LPN; 624THL X33443 AM) Cipro *FLUOROQUINOLONES*  Medication History (Ammie Eversole, LPN; 624THL 075-GRM AM) Atorvastatin Calcium (10MG  Tablet, Oral) Active. Citalopram Hydrobromide (40MG  Tablet, Oral) Active. Januvia (100MG  Tablet, Oral) Active. Meloxicam (15MG  Tablet, Oral) Active. MetFORMIN HCl ER (500MG  Tablet ER 24HR, Oral) Active. Vitamin B 12 (100MCG Lozenge, Oral) Active. Zantac (150MG   Capsule, Oral) Active.  Social History (Ammie Eversole, LPN; 624THL QA348G AM) Alcohol use Occasional alcohol use. Caffeine use Coffee. No drug use Tobacco use Never smoker.  Family History (Ammie Eversole, LPN; 624THL QA348G AM) Alcohol Abuse Father. Arthritis Father, Mother. Cancer Brother. Cerebrovascular Accident Brother, Mother. Depression Father. Diabetes Mellitus Father. Heart Disease Brother, Father. Heart disease in female family member before age 75 Hypertension Daughter, Father. Prostate Cancer Father.  Pregnancy / Birth History [name], LPN; 624THL QA348G AM) Age at menarche 6 years. Age of menopause 29-55 Gravida 3 Maternal age 5-25 Para 3     Review of Systems (Ammie Eversole LPN; 624THL QA348G AM) General Not Present- Appetite Loss, Chills, Fatigue, Fever, Night Sweats, Weight Gain and Weight Loss. Skin Not Present- Change in Wart/Mole, Dryness, Hives, Jaundice, New Lesions, Non-Healing Wounds, Rash and Ulcer. HEENT Present- Seasonal Allergies and Wears glasses/contact lenses. Not Present- Earache, Hearing Loss, Hoarseness, Nose Bleed, Oral Ulcers, Ringing in the Ears, Sinus Pain, Sore Throat, Visual Disturbances and Yellow Eyes. Respiratory Not Present- Bloody sputum, Chronic Cough, Difficulty Breathing, Snoring and Wheezing. Breast Present- Nipple Discharge. Not Present- Breast Mass, Breast Pain and Skin Changes. Cardiovascular Not Present- Chest Pain, Difficulty Breathing Lying Down, Leg Cramps, Palpitations, Rapid Heart Rate, Shortness of Breath and Swelling of Extremities. Gastrointestinal Not Present- Abdominal Pain, Bloating, Bloody Stool, Change in Bowel Habits, Chronic diarrhea, Constipation, Difficulty Swallowing, Excessive gas, Gets full quickly at meals, Hemorrhoids, Indigestion, Nausea, Rectal Pain and Vomiting. Female Genitourinary Not Present- Frequency, Nocturia, Painful Urination, Pelvic Pain and  Urgency. Musculoskeletal Present- Joint Pain. Not Present- Back Pain, Joint Stiffness, Muscle Pain, Muscle Weakness and Swelling of Extremities. Neurological Present- Tremor. Not Present- Decreased Memory, Fainting, Headaches, Numbness, Seizures, Tingling, Trouble walking and Weakness. Psychiatric Present- Depression. Not Present- Anxiety, Bipolar, Change in Sleep Pattern, Fearful and Frequent crying. Endocrine Not Present- Cold Intolerance, Excessive Hunger, Hair Changes,  Heat Intolerance, Hot flashes and New Diabetes.  Vitals (Ammie Eversole LPN; 624THL X33443 AM) 09/16/2014 9:00 AM Weight: 276.6 lb Height: 63in Body Surface Area: 2.36 m Body Mass Index: 49 kg/m Temp.: 97.62F(Oral)  Pulse: 80 (Regular)  BP: 148/72 (Sitting, Left Arm, Standard)     Physical Exam Rodman Key K. Westly Hinnant MD; 09/16/2014 9:59 AM)  The physical exam findings are as follows: Note:WDWN in NAD HEENT: EOMI, sclera anicteric Neck: No masses, no thyromegaly Lungs: CTA bilaterally; normal respiratory effort Breasts: symmetric; no lymphadenopathy; no palpable masses in left breast; resolving hematoma and thickening around the biopsy site on the right inferior and lateral to the nipple CV: Regular rate and rhythm; no murmurs Abd: +bowel sounds, soft, non-tender, no masses Ext: Well-perfused; no edema Skin: Warm, dry; no sign of jaundice    Assessment & Plan Rodman Key K. 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 The surgical procedure has been discussed with the patient. Potential risks, benefits, alternative treatments, and expected outcomes have been explained. All of the patient's questions at this time have been answered. The likelihood of reaching the patient's treatment goal is good. The patient understand the proposed surgical procedure and wishes to proceed.  Kaitlin Berry. [name], MD,  [facility]  General/ Trauma Surgery  09/16/2014 9:59 AM

## 2014-09-30 ENCOUNTER — Encounter (HOSPITAL_BASED_OUTPATIENT_CLINIC_OR_DEPARTMENT_OTHER)
Admission: RE | Admit: 2014-09-30 | Discharge: 2014-09-30 | Disposition: A | Discharge status: Home / Self Care | Payer: Medicare HMO | Source: Ambulatory Visit | Attending: Surgery | Admitting: Surgery

## 2014-09-30 ENCOUNTER — Other Ambulatory Visit: Payer: Self-pay

## 2014-09-30 ENCOUNTER — Encounter (HOSPITAL_BASED_OUTPATIENT_CLINIC_OR_DEPARTMENT_OTHER): Payer: Self-pay | Admitting: *Deleted

## 2014-09-30 DIAGNOSIS — E119 Type 2 diabetes mellitus without complications: Secondary | ICD-10-CM | POA: Diagnosis not present

## 2014-09-30 DIAGNOSIS — Z79899 Other long term (current) drug therapy: Secondary | ICD-10-CM | POA: Diagnosis not present

## 2014-09-30 DIAGNOSIS — Z6841 Body Mass Index (BMI) 40.0 and over, adult: Secondary | ICD-10-CM | POA: Diagnosis not present

## 2014-09-30 DIAGNOSIS — I10 Essential (primary) hypertension: Secondary | ICD-10-CM | POA: Diagnosis not present

## 2014-09-30 DIAGNOSIS — D241 Benign neoplasm of right breast: Secondary | ICD-10-CM | POA: Diagnosis present

## 2014-09-30 DIAGNOSIS — K219 Gastro-esophageal reflux disease without esophagitis: Secondary | ICD-10-CM | POA: Diagnosis not present

## 2014-09-30 LAB — BASIC METABOLIC PANEL
Anion gap: 8 (ref 5–15)
BUN: 26 mg/dL — ABNORMAL HIGH (ref 6–20)
CO2: 23 mmol/L (ref 22–32)
Calcium: 9 mg/dL (ref 8.9–10.3)
Chloride: 106 mmol/L (ref 101–111)
Creatinine, Ser: 1.24 mg/dL — ABNORMAL HIGH (ref 0.44–1.00)
GFR calc non Af Amer: 44 mL/min — ABNORMAL LOW (ref >60)
GFR, EST AFRICAN AMERICAN: 51 mL/min — AB (ref >60)
Glucose, Bld: 130 mg/dL — ABNORMAL HIGH (ref 65–99)
Potassium: 5.2 mmol/L — ABNORMAL HIGH (ref 3.5–5.1)
Sodium: 137 mmol/L (ref 135–145)

## 2014-09-30 NOTE — Progress Notes (Signed)
Pt in for Pre Admission labs and EKG.  EKG obtained from Dr Redmond Pulling at [facility] from 2013 and no changes noted.  Anesthesia consult done - Dr. Deatra Canter.  OK for surgery 10/01/14.  Pt for seed placement today at 1300. Today's EKG faxed to Dr. Redmond Pulling.

## 2014-09-30 NOTE — Progress Notes (Signed)
Patient is going for seed placement today 09-30-14 and will come by here afterwards for labs, EKG and anesthesia consult due to BMI 48.2.

## 2014-09-30 NOTE — Progress Notes (Signed)
DR [name] referred to Anesthesia lab results.  Dr Rodman Comp given results no change in orders.

## 2014-10-01 ENCOUNTER — Ambulatory Visit (HOSPITAL_BASED_OUTPATIENT_CLINIC_OR_DEPARTMENT_OTHER): Payer: Medicare HMO | Admitting: Anesthesiology

## 2014-10-01 ENCOUNTER — Ambulatory Visit (HOSPITAL_BASED_OUTPATIENT_CLINIC_OR_DEPARTMENT_OTHER)
Admission: RE | Admit: 2014-10-01 | Discharge: 2014-10-01 | Disposition: A | Discharge status: Home / Self Care | Payer: Medicare HMO | Source: Ambulatory Visit | Attending: Surgery | Admitting: Surgery

## 2014-10-01 ENCOUNTER — Encounter (HOSPITAL_BASED_OUTPATIENT_CLINIC_OR_DEPARTMENT_OTHER): Payer: Self-pay | Admitting: *Deleted

## 2014-10-01 ENCOUNTER — Encounter (HOSPITAL_BASED_OUTPATIENT_CLINIC_OR_DEPARTMENT_OTHER)
Admission: RE | Disposition: A | Discharge status: Home / Self Care | Payer: Self-pay | Source: Ambulatory Visit | Attending: Surgery

## 2014-10-01 DIAGNOSIS — I10 Essential (primary) hypertension: Secondary | ICD-10-CM | POA: Insufficient documentation

## 2014-10-01 DIAGNOSIS — D241 Benign neoplasm of right breast: Secondary | ICD-10-CM | POA: Diagnosis not present

## 2014-10-01 DIAGNOSIS — Z79899 Other long term (current) drug therapy: Secondary | ICD-10-CM | POA: Insufficient documentation

## 2014-10-01 DIAGNOSIS — Z6841 Body Mass Index (BMI) 40.0 and over, adult: Secondary | ICD-10-CM | POA: Insufficient documentation

## 2014-10-01 DIAGNOSIS — E119 Type 2 diabetes mellitus without complications: Secondary | ICD-10-CM | POA: Insufficient documentation

## 2014-10-01 DIAGNOSIS — K219 Gastro-esophageal reflux disease without esophagitis: Secondary | ICD-10-CM | POA: Insufficient documentation

## 2014-10-01 HISTORY — DX: Dorsalgia, unspecified: M54.9

## 2014-10-01 HISTORY — DX: Body Mass Index (BMI) 40.0 and over, adult: Z684

## 2014-10-01 HISTORY — DX: Sleep apnea, unspecified: G47.30

## 2014-10-01 HISTORY — PX: BREAST LUMPECTOMY WITH RADIOACTIVE SEED LOCALIZATION: SHX6424

## 2014-10-01 HISTORY — DX: Morbid (severe) obesity due to excess calories: E66.01

## 2014-10-01 HISTORY — DX: Unspecified osteoarthritis, unspecified site: M19.90

## 2014-10-01 HISTORY — DX: Other chronic pain: G89.29

## 2014-10-01 HISTORY — DX: Major depressive disorder, single episode, unspecified: F32.9

## 2014-10-01 HISTORY — DX: Gastro-esophageal reflux disease without esophagitis: K21.9

## 2014-10-01 HISTORY — DX: Anxiety disorder, unspecified: F41.9

## 2014-10-01 HISTORY — DX: Depression, unspecified: F32.A

## 2014-10-01 HISTORY — DX: Type 2 diabetes mellitus without complications: E11.9

## 2014-10-01 LAB — POCT HEMOGLOBIN-HEMACUE: Hemoglobin: 12.3 g/dL (ref 12.0–15.0)

## 2014-10-01 LAB — GLUCOSE, CAPILLARY
Glucose-Capillary: 123 mg/dL — ABNORMAL HIGH (ref 65–99)
Glucose-Capillary: 113 mg/dL — ABNORMAL HIGH (ref 65–99)

## 2014-10-01 SURGERY — BREAST LUMPECTOMY WITH RADIOACTIVE SEED LOCALIZATION
Anesthesia: General | Laterality: Right

## 2014-10-01 MED ORDER — MEPERIDINE HCL 25 MG/ML IJ SOLN: 6.2500 mg | INTRAMUSCULAR | Status: DC | PRN

## 2014-10-01 MED ORDER — FENTANYL CITRATE (PF) 100 MCG/2ML IJ SOLN
50.0000 ug | INTRAMUSCULAR | Status: AC | PRN
  Administered 2014-10-01 (×4): 50 ug via INTRAVENOUS

## 2014-10-01 MED ORDER — HYDROMORPHONE HCL 1 MG/ML IJ SOLN
INTRAMUSCULAR | Status: AC
  Filled 2014-10-01: qty 1

## 2014-10-01 MED ORDER — MIDAZOLAM HCL 2 MG/2ML IJ SOLN
INTRAMUSCULAR | Status: AC
  Filled 2014-10-01: qty 2

## 2014-10-01 MED ORDER — GLYCOPYRROLATE 0.2 MG/ML IJ SOLN: 0.2000 mg | Freq: Once | INTRAMUSCULAR | Status: DC | PRN

## 2014-10-01 MED ORDER — HYDROCODONE-ACETAMINOPHEN 5-325 MG PO TABS: 1.0000 | ORAL_TABLET | ORAL | Status: DC | PRN

## 2014-10-01 MED ORDER — ONDANSETRON HCL 4 MG/2ML IJ SOLN
INTRAMUSCULAR | Status: DC | PRN
  Administered 2014-10-01: 4 mg via INTRAVENOUS

## 2014-10-01 MED ORDER — ONDANSETRON HCL 4 MG/2ML IJ SOLN: 4.0000 mg | INTRAMUSCULAR | Status: DC | PRN

## 2014-10-01 MED ORDER — MIDAZOLAM HCL 2 MG/2ML IJ SOLN
1.0000 mg | INTRAMUSCULAR | Status: DC | PRN
Start: 2014-10-01 — End: 2014-10-01
  Administered 2014-10-01 (×2): 1 mg via INTRAVENOUS

## 2014-10-01 MED ORDER — HYDROMORPHONE HCL 1 MG/ML IJ SOLN
0.2500 mg | INTRAMUSCULAR | Status: DC | PRN
  Administered 2014-10-01: 0.5 mg via INTRAVENOUS
  Administered 2014-10-01 (×3): 0.25 mg via INTRAVENOUS
  Administered 2014-10-01: 0.5 mg via INTRAVENOUS

## 2014-10-01 MED ORDER — CEFAZOLIN SODIUM 1-5 GM-% IV SOLN
INTRAVENOUS | Status: AC
  Filled 2014-10-01: qty 50

## 2014-10-01 MED ORDER — PROPOFOL 10 MG/ML IV BOLUS
INTRAVENOUS | Status: DC | PRN
  Administered 2014-10-01: 200 mg via INTRAVENOUS

## 2014-10-01 MED ORDER — FENTANYL CITRATE (PF) 100 MCG/2ML IJ SOLN
INTRAMUSCULAR | Status: AC
  Filled 2014-10-01: qty 4

## 2014-10-01 MED ORDER — BUPIVACAINE-EPINEPHRINE 0.25% -1:200000 IJ SOLN
INTRAMUSCULAR | Status: DC | PRN
  Administered 2014-10-01: 10 mL

## 2014-10-01 MED ORDER — DEXAMETHASONE SODIUM PHOSPHATE 4 MG/ML IJ SOLN
INTRAMUSCULAR | Status: DC | PRN
  Administered 2014-10-01: 4 mg via INTRAVENOUS

## 2014-10-01 MED ORDER — LIDOCAINE HCL (CARDIAC) 20 MG/ML IV SOLN
INTRAVENOUS | Status: DC | PRN
  Administered 2014-10-01: 50 mg via INTRAVENOUS

## 2014-10-01 MED ORDER — SCOPOLAMINE 1 MG/3DAYS TD PT72: 1.0000 | MEDICATED_PATCH | Freq: Once | TRANSDERMAL | Status: DC | PRN

## 2014-10-01 MED ORDER — DEXTROSE 5 % IV SOLN
3.0000 g | INTRAVENOUS | Status: AC
  Administered 2014-10-01: 3 g via INTRAVENOUS

## 2014-10-01 MED ORDER — MORPHINE SULFATE 2 MG/ML IJ SOLN: 2.0000 mg | INTRAMUSCULAR | Status: DC | PRN

## 2014-10-01 MED ORDER — LACTATED RINGERS IV SOLN
INTRAVENOUS | Status: DC
  Administered 2014-10-01 (×2): via INTRAVENOUS

## 2014-10-01 MED ORDER — BUPIVACAINE-EPINEPHRINE (PF) 0.25% -1:200000 IJ SOLN
INTRAMUSCULAR | Status: AC
  Filled 2014-10-01: qty 30

## 2014-10-01 MED ORDER — CHLORHEXIDINE GLUCONATE 4 % EX LIQD: 1.0000 "application " | Freq: Once | CUTANEOUS | Status: DC

## 2014-10-01 MED ORDER — ONDANSETRON HCL 4 MG/2ML IJ SOLN
4.0000 mg | Freq: Once | INTRAMUSCULAR | Status: DC | PRN
Start: 2014-10-01 — End: 2014-10-01

## 2014-10-01 MED ORDER — CEFAZOLIN SODIUM-DEXTROSE 2-3 GM-% IV SOLR
INTRAVENOUS | Status: AC
Start: 2014-10-01 — End: 2014-10-01
  Filled 2014-10-01: qty 50

## 2014-10-01 SURGICAL SUPPLY — 48 items
APPLIER CLIP 9.375 MED OPEN (MISCELLANEOUS) ×3
BENZOIN TINCTURE PRP APPL 2/3 (GAUZE/BANDAGES/DRESSINGS) ×3 IMPLANT
BLADE HEX COATED 2.75 (ELECTRODE) ×3 IMPLANT
BLADE SURG 15 STRL LF DISP TIS (BLADE) ×1 IMPLANT
BLADE SURG 15 STRL SS (BLADE) ×2
CANISTER SUCT 1200ML W/VALVE (MISCELLANEOUS) ×3 IMPLANT
CHLORAPREP W/TINT 26ML (MISCELLANEOUS) ×3 IMPLANT
CLIP APPLIE 9.375 MED OPEN (MISCELLANEOUS) ×1 IMPLANT
CLOSURE WOUND 1/2 X4 (GAUZE/BANDAGES/DRESSINGS) ×1
COVER BACK TABLE 60X90IN (DRAPES) ×3 IMPLANT
COVER MAYO STAND STRL (DRAPES) ×3 IMPLANT
COVER PROBE W GEL 5X96 (DRAPES) ×3 IMPLANT
DECANTER SPIKE VIAL GLASS SM (MISCELLANEOUS) IMPLANT
DEVICE DUBIN W/COMP PLATE 8390 (MISCELLANEOUS) ×3 IMPLANT
DRAPE LAPAROTOMY 100X72 PEDS (DRAPES) ×3 IMPLANT
DRAPE UTILITY XL STRL (DRAPES) ×3 IMPLANT
DRSG TEGADERM 4X4.75 (GAUZE/BANDAGES/DRESSINGS) ×3 IMPLANT
ELECT REM PT RETURN 9FT ADLT (ELECTROSURGICAL) ×3
ELECTRODE REM PT RTRN 9FT ADLT (ELECTROSURGICAL) ×1 IMPLANT
GLOVE BIO SURGEON STRL SZ7 (GLOVE) ×3 IMPLANT
GLOVE BIOGEL PI IND STRL 7.5 (GLOVE) ×3 IMPLANT
GLOVE BIOGEL PI INDICATOR 7.5 (GLOVE) ×6
GLOVE SURG SS PI 7.5 STRL IVOR (GLOVE) ×3 IMPLANT
GOWN STRL REUS W/ TWL LRG LVL3 (GOWN DISPOSABLE) ×2 IMPLANT
GOWN STRL REUS W/TWL LRG LVL3 (GOWN DISPOSABLE) ×4
KIT MARKER MARGIN INK (KITS) ×3 IMPLANT
NEEDLE HYPO 25X1 1.5 SAFETY (NEEDLE) ×3 IMPLANT
NS IRRIG 1000ML POUR BTL (IV SOLUTION) ×3 IMPLANT
PACK BASIN DAY SURGERY FS (CUSTOM PROCEDURE TRAY) ×3 IMPLANT
PENCIL BUTTON HOLSTER BLD 10FT (ELECTRODE) ×3 IMPLANT
SLEEVE SCD COMPRESS KNEE MED (MISCELLANEOUS) ×3 IMPLANT
SPONGE GAUZE 2X2 8PLY STER LF (GAUZE/BANDAGES/DRESSINGS)
SPONGE GAUZE 2X2 8PLY STRL LF (GAUZE/BANDAGES/DRESSINGS) IMPLANT
SPONGE GAUZE 4X4 12PLY STER LF (GAUZE/BANDAGES/DRESSINGS) IMPLANT
SPONGE LAP 18X18 X RAY DECT (DISPOSABLE) IMPLANT
SPONGE LAP 4X18 X RAY DECT (DISPOSABLE) ×3 IMPLANT
STRIP CLOSURE SKIN 1/2X4 (GAUZE/BANDAGES/DRESSINGS) ×2 IMPLANT
SUT MON AB 4-0 PC3 18 (SUTURE) ×3 IMPLANT
SUT SILK 2 0 SH (SUTURE) IMPLANT
SUT VIC AB 3-0 SH 27 (SUTURE) ×2
SUT VIC AB 3-0 SH 27X BRD (SUTURE) ×1 IMPLANT
SYR BULB 3OZ (MISCELLANEOUS) IMPLANT
SYR CONTROL 10ML LL (SYRINGE) ×3 IMPLANT
TOWEL OR 17X24 6PK STRL BLUE (TOWEL DISPOSABLE) ×3 IMPLANT
TOWEL OR NON WOVEN STRL DISP B (DISPOSABLE) ×3 IMPLANT
TUBE CONNECTING 20'X1/4 (TUBING) ×1
TUBE CONNECTING 20X1/4 (TUBING) ×2 IMPLANT
YANKAUER SUCT BULB TIP NO VENT (SUCTIONS) ×3 IMPLANT

## 2014-10-01 NOTE — Anesthesia Procedure Notes (Signed)
Procedure Name: LMA Insertion Date/Time: 10/01/2014 1:46 PM Performed by: Lieutenant Diego Pre-anesthesia Checklist: Patient identified, Emergency Drugs available, Suction available and Patient being monitored Patient Re-evaluated:Patient Re-evaluated prior to inductionOxygen Delivery Method: Circle System Utilized Preoxygenation: Pre-oxygenation with 100% oxygen Intubation Type: IV induction Ventilation: Mask ventilation without difficulty LMA: LMA inserted LMA Size: 4.0 Number of attempts: 1 Airway Equipment and Method: Bite block Placement Confirmation: positive ETCO2 and breath sounds checked- equal and bilateral Tube secured with: Tape Dental Injury: Teeth and Oropharynx as per pre-operative assessment

## 2014-10-01 NOTE — Anesthesia Postprocedure Evaluation (Signed)
Anesthesia Post Note  Patient: Kaitlin Berry  Procedure(s) Performed: Procedure(s) (LRB): RIGHT BREAST LUMPECTOMY WITH RADIOACTIVE SEED LOCALIZATION (Right)  Anesthesia type: general  Patient location: PACU  Post pain: Pain level controlled  Post assessment: Patient's Cardiovascular Status Stable  Last Vitals:  Filed Vitals:   10/01/14 1515  BP: 151/67  Pulse: 93  Temp:   Resp: 16    Post vital signs: Reviewed and stable  Level of consciousness: sedated  Complications: No apparent anesthesia complications

## 2014-10-01 NOTE — Transfer of Care (Signed)
Immediate Anesthesia Transfer of Care Note  Patient: Kaitlin Berry  Procedure(s) Performed: Procedure(s): RIGHT BREAST LUMPECTOMY WITH RADIOACTIVE SEED LOCALIZATION (Right)  Patient Location: PACU  Anesthesia Type:General  Level of Consciousness: awake, alert  and oriented  Airway & Oxygen Therapy: Patient Spontanous Breathing and Patient connected to face mask oxygen  Post-op Assessment: Report given to RN and Post -op Vital signs reviewed and stable  Post vital signs: Reviewed and stable  Last Vitals:  Filed Vitals:   10/01/14 1226  BP: 150/65  Pulse:   Temp:   Resp:     Complications: No apparent anesthesia complications

## 2014-10-01 NOTE — Op Note (Signed)
Preop diagnosis: Probable intraductal papilloma right breast Postop diagnosis: Same Procedure performed: Right radioactive seed localized lumpectomy Surgeon:Jniya Madara K. Anesthesia: Gen. via LMA Indications: This is a 69 year old female who presented with clear nipple discharge. She underwent a workup which included a ductogram and ultrasound which showed a mass behind her right nipple. Biopsy showed that this likely represented an intraductal papilloma. A marker was placed at the time of biopsy. She presents now for lumpectomy. A radioactive seed was placed yesterday by radiology. The presence of the seed was confirmed in the holding area using the neoprobe device.  Description of procedure: The patient is brought to the operating room and placed in a supine position on the operating room table. After an adequate level of general anesthesia was obtained, her right breast was prepped with ChloraPrep and draped in sterile fashion. A timeout was taken to ensure the proper patient and proper procedure. The neoprobe was used to identify the area of greatest activity which is just behind the lateral half of the right nipple. I used a skin marker to outline a circumareolar incision in the lower lateral part of the nipple. This area was infiltrated with 0.25% Marcaine with epinephrine. We made our incision and dissected behind the nipple. I identified the area of greatest activity. We raised 4 margins around this area. The specimen was grasped with an Allis clamp and was lifted. The specimen was removed. There was no background activity after removal of the lump.  The specimen was oriented with a paint kit. Specimen mammogram revealed the biopsy clip as well as the radioactive seed within the specimen. There is a second biopsy clip that was placed at a previous needle biopsy that was also included within the specimen. This was confirmed by radiology. We then irrigated the biopsy cavity thoroughly and inspected  for hemostasis. The wound was closed with a deep layer of 3-0 Vicryl and a subcuticular layer of 4-0 Monocryl. Steri-Strips and clean dressings were applied. The patient was then extubated and brought to the recovery room in stable condition. All sponge, initially, and needle counts are correct.  Imogene Burn. [name], MD, [facility]  General/ Trauma Surgery  10/01/2014 2:46 PM

## 2014-10-01 NOTE — Discharge Instructions (Signed)
[facility] °Office Phone Number [phone] ° °BREAST BIOPSY/ PARTIAL MASTECTOMY: POST OP INSTRUCTIONS ° °Always review your discharge instruction sheet given to you by the facility where your surgery was performed. ° °IF YOU HAVE DISABILITY OR FAMILY LEAVE FORMS, YOU MUST BRING THEM TO THE OFFICE FOR PROCESSING.  DO NOT GIVE THEM TO YOUR DOCTOR. ° °1. A prescription for pain medication may be given to you upon discharge.  Take your pain medication as prescribed, if needed.  If narcotic pain medicine is not needed, then you may take acetaminophen (Tylenol) or ibuprofen (Advil) as needed. °2. Take your usually prescribed medications unless otherwise directed °3. If you need a refill on your pain medication, please contact your pharmacy.  They will contact our office to request authorization.  Prescriptions will not be filled after 5pm or on week-ends. °4. You should eat very light the first 24 hours after surgery, such as soup, crackers, pudding, etc.  Resume your normal diet the day after surgery. °5. Most patients will experience some swelling and bruising in the breast.  Ice packs and a good support bra will help.  Swelling and bruising can take several days to resolve.  °6. It is common to experience some constipation if taking pain medication after surgery.  Increasing fluid intake and taking a stool softener will usually help or prevent this problem from occurring.  A mild laxative (Milk of Magnesia or Miralax) should be taken according to package directions if there are no bowel movements after 48 hours. °7. Unless discharge instructions indicate otherwise, you may remove your bandages 24-48 hours after surgery, and you may shower at that time.  You may have steri-strips (small skin tapes) in place directly over the incision.  These strips should be left on the skin for 7-10 days.  If your surgeon used skin glue on the incision, you may shower in 24 hours.  The glue will flake off over the  next 2-3 weeks.  Any sutures or staples will be removed at the office during your follow-up visit. °8. ACTIVITIES:  You may resume regular daily activities (gradually increasing) beginning the next day.  Wearing a good support bra or sports bra minimizes pain and swelling.  You may have sexual intercourse when it is comfortable. °a. You may drive when you no longer are taking prescription pain medication, you can comfortably wear a seatbelt, and you can safely maneuver your car and apply brakes. °b. RETURN TO WORK:  ______________________________________________________________________________________ °9. You should see your doctor in the office for a follow-up appointment approximately two weeks after your surgery.  Your doctor’s nurse will typically make your follow-up appointment when she calls you with your pathology report.  Expect your pathology report 2-3 business days after your surgery.  You may call to check if you do not hear from us after three days. °10. OTHER INSTRUCTIONS: _______________________________________________________________________________________________ _____________________________________________________________________________________________________________________________________ °_____________________________________________________________________________________________________________________________________ °_____________________________________________________________________________________________________________________________________ ° °WHEN TO CALL YOUR DOCTOR: °1. Fever over 101.0 °2. Nausea and/or vomiting. °3. Extreme swelling or bruising. °4. Continued bleeding from incision. °5. Increased pain, redness, or drainage from the incision. ° °The clinic staff is available to answer your questions during regular business hours.  Please don’t hesitate to call and ask to speak to one of the nurses for clinical concerns.  If you have a medical emergency, go to the nearest  emergency room or call 911.  A surgeon from [facility] is always on call at the hospital. ° °For further questions, please visit [email or web address]  ° ° ° °  Post Anesthesia Home Care Instructions ° °Activity: °Get plenty of rest for the remainder of the day. A responsible adult should stay with you for 24 hours following the procedure.  °For the next 24 hours, DO NOT: °-Drive a car °-Operate machinery °-Drink alcoholic beverages °-Take any medication unless instructed by your physician °-Make any legal decisions or sign important papers. ° °Meals: °Start with liquid foods such as gelatin or soup. Progress to regular foods as tolerated. Avoid greasy, spicy, heavy foods. If nausea and/or vomiting occur, drink only clear liquids until the nausea and/or vomiting subsides. Call your physician if vomiting continues. ° °Special Instructions/Symptoms: °Your throat may feel dry or sore from the anesthesia or the breathing tube placed in your throat during surgery. If this causes discomfort, gargle with warm salt water. The discomfort should disappear within 24 hours. ° °If you had a scopolamine patch placed behind your ear for the management of post- operative nausea and/or vomiting: ° °1. The medication in the patch is effective for 72 hours, after which it should be removed.  Wrap patch in a tissue and discard in the trash. Wash hands thoroughly with soap and water. °2. You may remove the patch earlier than 72 hours if you experience unpleasant side effects which may include dry mouth, dizziness or visual disturbances. °3. Avoid touching the patch. Wash your hands with soap and water after contact with the patch. °  ° °

## 2014-10-01 NOTE — Anesthesia Preprocedure Evaluation (Addendum)
Anesthesia Evaluation  Patient identified by MRN, date of birth, ID band Patient awake    Reviewed: Allergy & Precautions, NPO status , Patient's Chart, lab work & pertinent test results  History of Anesthesia Complications Negative for: history of anesthetic complications  Airway Mallampati: I  TM Distance: >3 FB Neck ROM: Full    Dental   Pulmonary  breath sounds clear to auscultation  Pulmonary exam normal       Cardiovascular hypertension, Pt. on medications Normal cardiovascular examRhythm:Regular Rate:Normal     Neuro/Psych    GI/Hepatic GERD-  Medicated and Controlled,  Endo/Other  diabetes, Type 2, Oral Hypoglycemic AgentsMorbid obesity  Renal/GU      Musculoskeletal   Abdominal (+) + obese,   Peds  Hematology   Anesthesia Other Findings   Reproductive/Obstetrics                           Anesthesia Physical Anesthesia Plan  ASA: III  Anesthesia Plan: General   Post-op Pain Management:    Induction: Intravenous  Airway Management Planned: LMA  Additional Equipment:   Intra-op Plan:   Post-operative Plan: Extubation in OR  Informed Consent: I have reviewed the patients History and Physical, chart, labs and discussed the procedure including the risks, benefits and alternatives for the proposed anesthesia with the patient or authorized representative who has indicated his/her understanding and acceptance.     Plan Discussed with: CRNA and Surgeon  Anesthesia Plan Comments:        Anesthesia Quick Evaluation

## 2014-10-01 NOTE — Interval H&P Note (Signed)
History and Physical Interval Note:  10/01/2014 12:15 PM  Kaitlin Berry  has presented today for surgery, with the diagnosis of Right Breast Papilloma  The various methods of treatment have been discussed with the patient and family. After consideration of risks, benefits and other options for treatment, the patient has consented to  Procedure(s): RIGHT BREAST LUMPECTOMY WITH RADIOACTIVE SEED LOCALIZATION (Right) as a surgical intervention .  The patient's history has been reviewed, patient examined, no change in status, stable for surgery.  I have reviewed the patient's chart and labs.  Questions were answered to the patient's satisfaction.     [name]

## 2014-10-01 NOTE — H&P (View-Only) (Signed)
History of Present Illness Kaitlin Berry. Boruch Manuele MD; 09/16/2014 9:58 AM) Patient words: breast papilloma right.  The patient is a 69 year old female who presents with a complaint of nipple discharge. PCP Dr. Kathryne Eriksson Referred by Dr. Christene Slates for a right intraductal papilloma This is a 69 year old female who initially presented with several months of clear nipple discharge from the right breast in November 2015. She underwent diagnostic mammogram which was unremarkable. Ultrasound showed a finding at 12:00 anterior depth near the nipple. She underwent ultrasound-guided biopsy of this area in December 2015 which revealed benign breast tissue. She had a six-month follow-up mammogram that showed a dilated duct in the right breast consistent with a papilloma. She underwent a another core biopsy with clip placement on 08/29/14 which revealed an intraductal papilloma. She presents now to discuss lumpectomy. Other Problems (Ammie Eversole, LPN; 624THL QA348G AM) Arthritis Back Pain Bladder Problems Cancer Cholelithiasis Depression Gastroesophageal Reflux Disease Oophorectomy Bilateral. Sleep Apnea  Past Surgical History (Ammie Eversole, LPN; 624THL QA348G AM) Breast Biopsy multiple Gallbladder Surgery - Laparoscopic Hysterectomy (due to cancer) - Complete Oral Surgery Spinal Surgery - Lower Back  Diagnostic Studies History (Ammie Eversole, LPN; 624THL QA348G AM) Colonoscopy never Mammogram within last year Pap Smear >5 years ago  Allergies (Ammie Eversole, LPN; 624THL X33443 AM) Cipro *FLUOROQUINOLONES*  Medication History (Ammie Eversole, LPN; 624THL 075-GRM AM) Atorvastatin Calcium (10MG  Tablet, Oral) Active. Citalopram Hydrobromide (40MG  Tablet, Oral) Active. Januvia (100MG  Tablet, Oral) Active. Meloxicam (15MG  Tablet, Oral) Active. MetFORMIN HCl ER (500MG  Tablet ER 24HR, Oral) Active. Vitamin B 12 (100MCG Lozenge, Oral) Active. Zantac (150MG   Capsule, Oral) Active.  Social History (Ammie Eversole, LPN; 624THL QA348G AM) Alcohol use Occasional alcohol use. Caffeine use Coffee. No drug use Tobacco use Never smoker.  Family History (Ammie Eversole, LPN; 624THL QA348G AM) Alcohol Abuse Father. Arthritis Father, Mother. Cancer Brother. Cerebrovascular Accident Brother, Mother. Depression Father. Diabetes Mellitus Father. Heart Disease Brother, Father. Heart disease in female family member before age 15 Hypertension Daughter, Father. Prostate Cancer Father.  Pregnancy / Birth History [name], LPN; 624THL QA348G AM) Age at menarche 58 years. Age of menopause 9-55 Gravida 3 Maternal age 34-25 Para 3     Review of Systems (Ammie Eversole LPN; 624THL QA348G AM) General Not Present- Appetite Loss, Chills, Fatigue, Fever, Night Sweats, Weight Gain and Weight Loss. Skin Not Present- Change in Wart/Mole, Dryness, Hives, Jaundice, New Lesions, Non-Healing Wounds, Rash and Ulcer. HEENT Present- Seasonal Allergies and Wears glasses/contact lenses. Not Present- Earache, Hearing Loss, Hoarseness, Nose Bleed, Oral Ulcers, Ringing in the Ears, Sinus Pain, Sore Throat, Visual Disturbances and Yellow Eyes. Respiratory Not Present- Bloody sputum, Chronic Cough, Difficulty Breathing, Snoring and Wheezing. Breast Present- Nipple Discharge. Not Present- Breast Mass, Breast Pain and Skin Changes. Cardiovascular Not Present- Chest Pain, Difficulty Breathing Lying Down, Leg Cramps, Palpitations, Rapid Heart Rate, Shortness of Breath and Swelling of Extremities. Gastrointestinal Not Present- Abdominal Pain, Bloating, Bloody Stool, Change in Bowel Habits, Chronic diarrhea, Constipation, Difficulty Swallowing, Excessive gas, Gets full quickly at meals, Hemorrhoids, Indigestion, Nausea, Rectal Pain and Vomiting. Female Genitourinary Not Present- Frequency, Nocturia, Painful Urination, Pelvic Pain and  Urgency. Musculoskeletal Present- Joint Pain. Not Present- Back Pain, Joint Stiffness, Muscle Pain, Muscle Weakness and Swelling of Extremities. Neurological Present- Tremor. Not Present- Decreased Memory, Fainting, Headaches, Numbness, Seizures, Tingling, Trouble walking and Weakness. Psychiatric Present- Depression. Not Present- Anxiety, Bipolar, Change in Sleep Pattern, Fearful and Frequent crying. Endocrine Not Present- Cold Intolerance, Excessive Hunger, Hair Changes,  Heat Intolerance, Hot flashes and New Diabetes.  Vitals (Ammie Eversole LPN; 624THL X33443 AM) 09/16/2014 9:00 AM Weight: 276.6 lb Height: 63in Body Surface Area: 2.36 m Body Mass Index: 49 kg/m Temp.: 97.22F(Oral)  Pulse: 80 (Regular)  BP: 148/72 (Sitting, Left Arm, Standard)     Physical Exam Rodman Key K. Ravin Denardo MD; 09/16/2014 9:59 AM)  The physical exam findings are as follows: Note:WDWN in NAD HEENT: EOMI, sclera anicteric Neck: No masses, no thyromegaly Lungs: CTA bilaterally; normal respiratory effort Breasts: symmetric; no lymphadenopathy; no palpable masses in left breast; resolving hematoma and thickening around the biopsy site on the right inferior and lateral to the nipple CV: Regular rate and rhythm; no murmurs Abd: +bowel sounds, soft, non-tender, no masses Ext: Well-perfused; no edema Skin: Warm, dry; no sign of jaundice    Assessment & Plan Rodman Key K. 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 The surgical procedure has been discussed with the patient. Potential risks, benefits, alternative treatments, and expected outcomes have been explained. All of the patient's questions at this time have been answered. The likelihood of reaching the patient's treatment goal is good. The patient understand the proposed surgical procedure and wishes to proceed.  Kaitlin Berry. [name], MD,  [facility]  General/ Trauma Surgery  09/16/2014 9:59 AM

## 2014-10-02 ENCOUNTER — Encounter (HOSPITAL_BASED_OUTPATIENT_CLINIC_OR_DEPARTMENT_OTHER): Payer: Self-pay | Admitting: Surgery

## 2016-11-15 ENCOUNTER — Other Ambulatory Visit: Payer: Self-pay | Admitting: Physician Assistant

## 2016-11-15 ENCOUNTER — Ambulatory Visit
Admission: RE | Admit: 2016-11-15 | Discharge: 2016-11-15 | Disposition: A | Discharge status: Home / Self Care | Payer: Medicare HMO | Source: Ambulatory Visit | Attending: Physician Assistant | Admitting: Physician Assistant

## 2016-11-15 DIAGNOSIS — R0602 Shortness of breath: Secondary | ICD-10-CM

## 2016-11-15 DIAGNOSIS — R0600 Dyspnea, unspecified: Secondary | ICD-10-CM

## 2016-11-15 NOTE — Progress Notes (Deleted)
Cardiology Office Note:    Date:  11/15/2016   ID:  Kaitlin Berry, DOB 1945-12-25, MRN [id number]  PCP:  Christain Sacramento, MD  Cardiologist:  Fransico Him, MD   Referring MD: Christain Sacramento, MD   No chief complaint on file.   History of Present Illness:    Kaitlin Berry is a 71 y.o. female with a hx of ***  Past Medical History:  Diagnosis Date  . Anxiety   . Arthritis    rt knee  . Chronic back pain   . Depression   . Diabetes mellitus without complication   . GERD (gastroesophageal reflux disease)   . Hyperlipidemia   . Hypertension   . Morbid obesity with BMI of 45.0-49.9, adult   . Sleep apnea    CPAP    Past Surgical History:  Procedure Laterality Date  . ABDOMINAL HYSTERECTOMY    . BACK SURGERY     lumbar  . BREAST LUMPECTOMY WITH RADIOACTIVE SEED LOCALIZATION Right 10/01/2014   Procedure: RIGHT BREAST LUMPECTOMY WITH RADIOACTIVE SEED LOCALIZATION;  Surgeon: Donnie Mesa, MD;  Location: [facility];  Service: General;  Laterality: Right;  . CHOLECYSTECTOMY    . INCISIONAL BREAST BIOPSY     multiple  . SPINE SURGERY      Current Medications: No outpatient prescriptions have been marked as taking for the 11/16/16 encounter (Appointment) with Sueanne Margarita, MD.     Allergies:   Ciprofloxacin hcl   Social History   Social History  . Marital status: Married    Spouse name: N/A  . Number of children: N/A  . Years of education: N/A   Social History Main Topics  . Smoking status: Never Smoker  . Smokeless tobacco: Not on file  . Alcohol use Yes     Comment: social  . Drug use: No  . Sexual activity: Not on file   Other Topics Concern  . Not on file   Social History Narrative  . No narrative on file     Family History: The patient's ***family history is not on file.  ROS:   Please see the history of present illness.    *** All other systems reviewed and are negative.  EKGs/Labs/Other Studies Reviewed:    The following  studies were reviewed today: ***  EKG:  EKG is *** ordered today.  The ekg ordered today demonstrates ***  Recent Labs: No results found for requested labs within last 8760 hours.   Recent Lipid Panel No results found for: CHOL, TRIG, HDL, CHOLHDL, VLDL, LDLCALC, LDLDIRECT  Physical Exam:    VS:  There were no vitals taken for this visit.    Wt Readings from Last 3 Encounters:  10/01/14 274 lb (124.3 kg)  12/18/12 281 lb (127.5 kg)  11/27/11 264 lb (119.7 kg)     GEN: *** Well nourished, well developed in no acute distress HEENT: Normal NECK: No JVD; No carotid bruits LYMPHATICS: No lymphadenopathy CARDIAC: ***RRR, no murmurs, rubs, gallops RESPIRATORY:  Clear to auscultation without rales, wheezing or rhonchi  ABDOMEN: Soft, non-tender, non-distended MUSCULOSKELETAL:  No edema; No deformity  SKIN: Warm and dry NEUROLOGIC:  Alert and oriented x 3 PSYCHIATRIC:  Normal affect   ASSESSMENT:    No diagnosis found. PLAN:    In order of problems listed above:  1. ***   Medication Adjustments/Labs and Tests Ordered: Current medicines are reviewed at length with the patient today.  Concerns regarding medicines are outlined above.  No orders of the defined types were placed in this encounter.  No orders of the defined types were placed in this encounter.   Signed, Fransico Him, MD  11/15/2016 5:19 PM    [place]

## 2016-11-16 ENCOUNTER — Ambulatory Visit (INDEPENDENT_AMBULATORY_CARE_PROVIDER_SITE_OTHER): Payer: Medicare HMO | Admitting: Cardiology

## 2016-11-16 ENCOUNTER — Other Ambulatory Visit: Payer: Self-pay | Admitting: Physician Assistant

## 2016-11-16 ENCOUNTER — Ambulatory Visit
Admission: RE | Admit: 2016-11-16 | Discharge: 2016-11-16 | Disposition: A | Discharge status: Home / Self Care | Payer: Medicare HMO | Source: Ambulatory Visit | Attending: Physician Assistant | Admitting: Physician Assistant

## 2016-11-16 VITALS — BP 132/74 | HR 95 | Ht 64.0 in | Wt 273.1 lb

## 2016-11-16 DIAGNOSIS — R079 Chest pain, unspecified: Secondary | ICD-10-CM | POA: Diagnosis not present

## 2016-11-16 DIAGNOSIS — R0602 Shortness of breath: Secondary | ICD-10-CM

## 2016-11-16 DIAGNOSIS — I509 Heart failure, unspecified: Secondary | ICD-10-CM

## 2016-11-16 DIAGNOSIS — R7989 Other specified abnormal findings of blood chemistry: Secondary | ICD-10-CM

## 2016-11-16 MED ORDER — IOPAMIDOL (ISOVUE-370) INJECTION 76%
75.0000 mL | Freq: Once | INTRAVENOUS | Status: AC | PRN
  Administered 2016-11-16: 75 mL via INTRAVENOUS

## 2016-11-16 NOTE — Patient Instructions (Signed)
Schedule Lexiscan Myoview   Schedule Echo    Your physician recommends that you schedule a follow-up appointment in: 3 weeks with Dr.Turner or PA.

## 2016-11-16 NOTE — Progress Notes (Signed)
Cardiology Office Note    Date:  11/17/2016   ID:  Kaitlin Berry, DOB 08-01-1945, MRN [id number]  PCP:  Christain Sacramento, MD  Cardiologist:  Fransico Him, MD   No chief complaint on file.   History of Present Illness:  Kaitlin Berry is a 71 y.o. female who is being seen today for the evaluation of SOB and LE edema at the request of Christain Sacramento, MD.  This is a 71yo female with a hx of morbid obesity, chronic back pain, DM, HTN and hyperlipidemia who is referred here today for evaluation of chest pain, SOB and LE edema.  She says that SOB has been getting worse over the past year.  She says that a few days ago she felt very bad with diarrhea from her IBS.  She took Imodium immediately and went to be but since then has had very bad problems with SOB including PND and orthopnea even using her CPAP machine.  Her symptoms lasted through the weekend but yesterday felt better when she got up but then through the day she got more SOB and she noticed that her chest was tight.  She was given Lasix which significantly improved her breathing.  She says that the tightness in her chest started mid week last week and was constant until she got the lasix.  She continues to have a fullness in her chest and abdomen and has noticed significant swelling in her legs. Chest CT angio showed no evidence of PE and no edema.  She says that her BNP was elevated at 750.  Prior to this episode she denies any prior CP. She has never smoked.  Her father had CHF and DM and died from prostate CA.  Her brother had 2 MIs and died of lung CA and he was a smoker.   Past Medical History:  Diagnosis Date  . Acute cystitis 11/27/2011  . Anxiety   . Arthritis    rt knee  . BACK PAIN 03/24/2007   Qualifier: Diagnosis of  By: Gwenette Greet MD, Armando Reichert   . Chronic back pain   . Depression   . DEPRESSION 03/24/2007   Qualifier: Diagnosis of  By: Gwenette Greet MD, Armando Reichert   . Diabetes mellitus without complication ([place])   . GERD  (gastroesophageal reflux disease)   . Hyperlipidemia   . HYPERSOMNIA 03/24/2007   Qualifier: Diagnosis of  By: Gwenette Greet MD, Armando Reichert   . Hypertension   . Morbid obesity with BMI of 45.0-49.9, adult ([place])   . Obesity 04/22/2011  . OBSTRUCTIVE SLEEP APNEA 05/17/2007   Qualifier: Diagnosis of  By: Gwenette Greet MD, Armando Reichert   . Sleep apnea    CPAP  . Urticaria 11/27/2011    Past Surgical History:  Procedure Laterality Date  . ABDOMINAL HYSTERECTOMY    . BACK SURGERY     lumbar  . BREAST LUMPECTOMY WITH RADIOACTIVE SEED LOCALIZATION Right 10/01/2014   Procedure: RIGHT BREAST LUMPECTOMY WITH RADIOACTIVE SEED LOCALIZATION;  Surgeon: Donnie Mesa, MD;  Location: [facility];  Service: General;  Laterality: Right;  . CHOLECYSTECTOMY    . INCISIONAL BREAST BIOPSY     multiple  . SPINE SURGERY      Current Medications: Current Meds  Medication Sig  . atorvastatin (LIPITOR) 20 MG tablet Take 20 mg by mouth daily.  . citalopram (CELEXA) 40 MG tablet Take 40 mg by mouth daily.  . furosemide (LASIX) 40 MG tablet Take 40 mg by mouth daily.  Marland Kitchen  HYDROcodone-acetaminophen (NORCO/VICODIN) 5-325 MG per tablet Take 1 tablet by mouth every 4 (four) hours as needed.  Marland Kitchen lisinopril (PRINIVIL,ZESTRIL) 10 MG tablet Take 10 mg by mouth daily.  . meloxicam (MOBIC) 15 MG tablet Take 15 mg by mouth daily.  . metFORMIN (GLUCOPHAGE) 500 MG tablet Take 500 mg by mouth 4 (four) times daily.  . ranitidine (ZANTAC) 150 MG tablet Take 150 mg by mouth 2 (two) times daily.  . sitaGLIPtin (JANUVIA) 100 MG tablet Take 100 mg by mouth daily.    Allergies:   Atorvastatin; Ciprofloxacin; and Ciprofloxacin hcl   Social History   Social History  . Marital status: Married    Spouse name: N/A  . Number of children: N/A  . Years of education: N/A   Social History Main Topics  . Smoking status: Never Smoker  . Smokeless tobacco: Never Used  . Alcohol use Yes     Comment: social  . Drug use: No  . Sexual activity:  Not Asked   Other Topics Concern  . None   Social History Narrative  . None     Family History:  The patient's family history includes Alzheimer's disease in her mother; Cancer in her father; Diabetes in her brother and father; Heart attack in her brother; Heart disease in her father; Lung cancer in her brother; Stroke in her brother.   ROS:   Please see the history of present illness.    ROS All other systems reviewed and are negative.  No flowsheet data found.     PHYSICAL EXAM:   VS:  BP 132/74   Pulse 95   Ht 5\' 4"  (1.626 m)   Wt 273 lb 1.9 oz (123.9 kg)   SpO2 96%   BMI 46.88 kg/m    GEN: Well nourished, well developed, in no acute distress  HEENT: normal  Neck: no JVD, carotid bruits, or masses Cardiac: RRR; no murmurs, rubs, or gallops,no edema.  Intact distal pulses bilaterally.  Respiratory:  clear to auscultation bilaterally, normal work of breathing GI: soft, nontender, nondistended, + BS MS: no deformity or atrophy  Skin: warm and dry, no rash Neuro:  Alert and Oriented x 3, Strength and sensation are intact Psych: euthymic mood, full affect  Wt Readings from Last 3 Encounters:  11/16/16 273 lb 1.9 oz (123.9 kg)  10/01/14 274 lb (124.3 kg)  12/18/12 281 lb (127.5 kg)      Studies/Labs Reviewed:   EKG:  EKG is not ordered today.  The ekg at PCP office showed NSR with LAFb  Recent Labs: No results found for requested labs within last 8760 hours.   Lipid Panel No results found for: CHOL, TRIG, HDL, CHOLHDL, VLDL, LDLCALC, LDLDIRECT  Additional studies/ records that were reviewed today include:  Office notes from PCP    ASSESSMENT:    1. Shortness of breath   2. Chest pain, unspecified type   3. Acute congestive heart failure, unspecified heart failure type ([place])      PLAN:  In order of problems listed above:  1.  SOB - I suspect this is multifactorial due to morbid obesity, sedentary lifestyle and probable diastolic CHF.  She recently  was told her BNP was in the 700's and started on Lasix with improvement in her SOB.  She still is markedly SOB when she ambulates but difficult to determine how much is related to her morbid obesity.  I will repeat her BNP and get a 2D echo to assess LVF and diastolic  function.  2.  Chest tightness that is likely related to CHF as the pain improved with diuresis.  She does have CRF which includes morbid obesity, HTN and strong family history of CAD.  I will get a 2 day stress myoview to rule out ischemia.    3.  Acute CHF - likely diastolic.  She will continue on Lasix and I will get a echo to evaluate further.   4.  HTN - her BP is adequately controlled on current meds. She will continue on ACE I.    Medication Adjustments/Labs and Tests Ordered: Current medicines are reviewed at length with the patient today.  Concerns regarding medicines are outlined above.  Medication changes, Labs and Tests ordered today are listed in the Patient Instructions below.  Patient Instructions  Schedule Lexiscan Myoview   Schedule Echo    Your physician recommends that you schedule a follow-up appointment in: 3 weeks with Dr.Turner or PA.     Signed, Fransico Him, MD  11/17/2016 10:23 AM    [address] [place], [place], [place]  [address] Phone: [phone]; Fax: [phone]

## 2016-11-17 DIAGNOSIS — R0609 Other forms of dyspnea: Secondary | ICD-10-CM | POA: Insufficient documentation

## 2016-11-17 DIAGNOSIS — R072 Precordial pain: Secondary | ICD-10-CM | POA: Insufficient documentation

## 2016-11-17 DIAGNOSIS — R0602 Shortness of breath: Secondary | ICD-10-CM | POA: Insufficient documentation

## 2016-11-17 DIAGNOSIS — R079 Chest pain, unspecified: Secondary | ICD-10-CM | POA: Insufficient documentation

## 2016-11-17 DIAGNOSIS — I509 Heart failure, unspecified: Secondary | ICD-10-CM | POA: Insufficient documentation

## 2016-11-23 ENCOUNTER — Institutional Professional Consult (permissible substitution): Payer: Medicare HMO | Admitting: Pulmonary Disease

## 2016-11-24 ENCOUNTER — Telehealth (HOSPITAL_COMMUNITY): Payer: Self-pay | Admitting: *Deleted

## 2016-11-24 NOTE — Telephone Encounter (Signed)
Patient given detailed instructions per Myocardial Perfusion Study Information Sheet for the test on 11/29/16. Patient notified to arrive 15 minutes early and that it is imperative to arrive on time for appointment to keep from having the test rescheduled.  If you need to cancel or reschedule your appointment, please call the office within 24 hours of your appointment. . Patient verbalized understanding. Kirstie Peri

## 2016-11-26 ENCOUNTER — Encounter: Payer: Self-pay | Admitting: Physician Assistant

## 2016-11-29 ENCOUNTER — Ambulatory Visit (HOSPITAL_BASED_OUTPATIENT_CLINIC_OR_DEPARTMENT_OTHER): Payer: Medicare HMO

## 2016-11-29 ENCOUNTER — Ambulatory Visit (HOSPITAL_COMMUNITY): Payer: Medicare HMO | Attending: Internal Medicine

## 2016-11-29 ENCOUNTER — Other Ambulatory Visit: Payer: Self-pay

## 2016-11-29 DIAGNOSIS — I10 Essential (primary) hypertension: Secondary | ICD-10-CM | POA: Diagnosis not present

## 2016-11-29 DIAGNOSIS — G8929 Other chronic pain: Secondary | ICD-10-CM | POA: Diagnosis not present

## 2016-11-29 DIAGNOSIS — R0602 Shortness of breath: Secondary | ICD-10-CM

## 2016-11-29 DIAGNOSIS — M549 Dorsalgia, unspecified: Secondary | ICD-10-CM | POA: Diagnosis not present

## 2016-11-29 DIAGNOSIS — E119 Type 2 diabetes mellitus without complications: Secondary | ICD-10-CM | POA: Insufficient documentation

## 2016-11-29 DIAGNOSIS — E785 Hyperlipidemia, unspecified: Secondary | ICD-10-CM | POA: Insufficient documentation

## 2016-11-29 DIAGNOSIS — R079 Chest pain, unspecified: Secondary | ICD-10-CM | POA: Diagnosis not present

## 2016-11-29 HISTORY — PX: NM MYOVIEW LTD: HXRAD82

## 2016-11-29 HISTORY — PX: TRANSTHORACIC ECHOCARDIOGRAM: SHX275

## 2016-11-29 MED ORDER — REGADENOSON 0.4 MG/5ML IV SOLN
0.4000 mg | Freq: Once | INTRAVENOUS | Status: AC
  Administered 2016-11-29: 0.4 mg via INTRAVENOUS

## 2016-11-29 MED ORDER — TECHNETIUM TC 99M TETROFOSMIN IV KIT
33.0000 | PACK | Freq: Once | INTRAVENOUS | Status: AC | PRN
  Administered 2016-11-29: 33 via INTRAVENOUS
  Filled 2016-11-29: qty 33

## 2016-11-30 ENCOUNTER — Ambulatory Visit (HOSPITAL_COMMUNITY): Payer: Medicare HMO | Attending: Cardiology

## 2016-11-30 LAB — MYOCARDIAL PERFUSION IMAGING
CHL CUP NUCLEAR SDS: 2
LHR: 0.42
LV sys vol: 19 mL
LVDIAVOL: 69 mL (ref 46–106)
NUC STRESS TID: 0.85
Peak HR: 111 {beats}/min
Rest HR: 88 {beats}/min
SRS: 2
SSS: 3

## 2016-11-30 MED ORDER — TECHNETIUM TC 99M TETROFOSMIN IV KIT
30.0000 | PACK | Freq: Once | INTRAVENOUS | Status: AC | PRN
  Administered 2016-11-30: 30 via INTRAVENOUS
  Filled 2016-11-30: qty 30

## 2016-12-02 ENCOUNTER — Telehealth: Payer: Self-pay | Admitting: Internal Medicine

## 2016-12-02 ENCOUNTER — Telehealth: Payer: Self-pay | Admitting: *Deleted

## 2016-12-02 NOTE — Telephone Encounter (Signed)
-----   Message from Sueanne Margarita, MD sent at 12/02/2016  9:48 AM EDT ----- Please let patient know that stress test was fine

## 2016-12-02 NOTE — Telephone Encounter (Signed)
Patient informed. 

## 2016-12-02 NOTE — Telephone Encounter (Signed)
New message     Patient returning call. Please call

## 2016-12-07 ENCOUNTER — Ambulatory Visit (INDEPENDENT_AMBULATORY_CARE_PROVIDER_SITE_OTHER): Payer: Medicare HMO | Admitting: Physician Assistant

## 2016-12-07 ENCOUNTER — Encounter: Payer: Self-pay | Admitting: Physician Assistant

## 2016-12-07 VITALS — BP 136/68 | HR 95 | Ht 64.0 in | Wt 277.0 lb

## 2016-12-07 DIAGNOSIS — E039 Hypothyroidism, unspecified: Secondary | ICD-10-CM | POA: Diagnosis not present

## 2016-12-07 DIAGNOSIS — I10 Essential (primary) hypertension: Secondary | ICD-10-CM | POA: Insufficient documentation

## 2016-12-07 DIAGNOSIS — R079 Chest pain, unspecified: Secondary | ICD-10-CM | POA: Diagnosis not present

## 2016-12-07 DIAGNOSIS — I5032 Chronic diastolic (congestive) heart failure: Secondary | ICD-10-CM

## 2016-12-07 NOTE — Progress Notes (Signed)
Cardiology Office Note    Date:  12/07/2016   ID:  Kaitlin Berry, DOB 07/09/1945, MRN [id number]  PCP:  Christain Sacramento, MD  Cardiologist: Dr. Radford Pax  Chief Complaint  Patient presents with  . Follow-up    History of Present Illness:  Kaitlin Berry is a 71 y.o. female  with a hx of morbid obesity, chronic back pain, DM, HTN and hyperlipidemia who saw Dr. Radford Pax for the first time 11/16/16 with shortness of breath felt to be multifactorial due to morbid obesity, sedentary lifestyle and probable diastolic CHF. She was recently told her BNP was in the 700s and started on Lasix with improvement. Chest tightness likely related to CHF improved with diuresis. She does have CR after including morbid obesity, hypertension and strong family history of CAD. 2-D echo 11/29/16 showed normal LV function EF 60-65% with grade 1 DD. Nuclear stress test and/2/18 was normal. Labs on 11/15/16 normal BNP and creatinine TSH and chest x-ray.  Patient comes in today for follow-up. She hasn't had any further fluid buildup. She feels poorly since she was started on thyroid medications for a TSH over 7. She said she took it for a couple weeks and then stopped it because she was feeling bad and she felt some much better. She is back on it and feeling poorly again.    Past Medical History:  Diagnosis Date  . Acute cystitis 11/27/2011  . Anxiety   . Arthritis    rt knee  . BACK PAIN 03/24/2007   Qualifier: Diagnosis of  By: Gwenette Greet MD, Armando Reichert   . Chronic back pain   . Depression   . DEPRESSION 03/24/2007   Qualifier: Diagnosis of  By: Gwenette Greet MD, Armando Reichert   . Diabetes mellitus without complication ([place])   . GERD (gastroesophageal reflux disease)   . Hyperlipidemia   . HYPERSOMNIA 03/24/2007   Qualifier: Diagnosis of  By: Gwenette Greet MD, Armando Reichert   . Hypertension   . Morbid obesity with BMI of 45.0-49.9, adult ([place])   . Obesity 04/22/2011  . OBSTRUCTIVE SLEEP APNEA 05/17/2007   Qualifier: Diagnosis of  By: Gwenette Greet  MD, Armando Reichert   . Sleep apnea    CPAP  . Urticaria 11/27/2011    Past Surgical History:  Procedure Laterality Date  . ABDOMINAL HYSTERECTOMY    . BACK SURGERY     lumbar  . BREAST LUMPECTOMY WITH RADIOACTIVE SEED LOCALIZATION Right 10/01/2014   Procedure: RIGHT BREAST LUMPECTOMY WITH RADIOACTIVE SEED LOCALIZATION;  Surgeon: Donnie Mesa, MD;  Location: [facility];  Service: General;  Laterality: Right;  . CHOLECYSTECTOMY    . INCISIONAL BREAST BIOPSY     multiple  . SPINE SURGERY      Current Medications: Current Meds  Medication Sig  . atorvastatin (LIPITOR) 20 MG tablet Take 20 mg by mouth daily.  . citalopram (CELEXA) 40 MG tablet Take 40 mg by mouth daily.  . furosemide (LASIX) 40 MG tablet Take 40 mg by mouth daily.  Marland Kitchen HYDROcodone-acetaminophen (NORCO/VICODIN) 5-325 MG per tablet Take 1 tablet by mouth every 4 (four) hours as needed.  Marland Kitchen lisinopril (PRINIVIL,ZESTRIL) 10 MG tablet Take 10 mg by mouth daily.  . meloxicam (MOBIC) 15 MG tablet Take 15 mg by mouth daily.  . metFORMIN (GLUCOPHAGE) 500 MG tablet Take 500 mg by mouth 4 (four) times daily.  . ranitidine (ZANTAC) 150 MG tablet Take 150 mg by mouth 2 (two) times daily.  . sitaGLIPtin (JANUVIA) 100 MG  tablet Take 100 mg by mouth daily.     Allergies:   Atorvastatin; Ciprofloxacin; and Ciprofloxacin hcl   Social History   Social History  . Marital status: Married    Spouse name: N/A  . Number of children: N/A  . Years of education: N/A   Social History Main Topics  . Smoking status: Never Smoker  . Smokeless tobacco: Never Used  . Alcohol use Yes     Comment: social  . Drug use: No  . Sexual activity: Not Asked   Other Topics Concern  . None   Social History Narrative  . None     Family History:  The patient's family history includes Alzheimer's disease in her mother; Cancer in her father; Diabetes in her brother and father; Heart attack in her brother; Heart disease in her father; Lung  cancer in her brother; Stroke in her brother.   ROS:   Please see the history of present illness.    Review of Systems  Constitution: Positive for weakness and malaise/fatigue.  HENT: Negative.   Eyes: Negative.   Cardiovascular: Negative.   Respiratory: Negative.   Hematologic/Lymphatic: Negative.   Musculoskeletal: Positive for back pain and muscle weakness. Negative for joint pain.  Gastrointestinal: Negative.   Genitourinary: Negative.    All other systems reviewed and are negative.   PHYSICAL EXAM:   VS:  BP 136/68   Pulse 95   Ht 5\' 4"  (1.626 m)   Wt 277 lb (125.6 kg)   SpO2 97%   BMI 47.55 kg/m   Physical Exam  GEN: Obese, in no acute distress  Neck: no JVD, carotid bruits, or masses Cardiac:RRR; no murmurs, rubs, or gallops  Respiratory:  clear to auscultation bilaterally, normal work of breathing GI: soft, nontender, nondistended, + BS Ext: without cyanosis, clubbing, or edema, Good distal pulses bilaterally Neuro:  Alert and Oriented x 3 Psych: euthymic mood, full affect  Wt Readings from Last 3 Encounters:  12/07/16 277 lb (125.6 kg)  11/29/16 273 lb (123.8 kg)  11/16/16 273 lb 1.9 oz (123.9 kg)      Studies/Labs Reviewed:   EKG:  EKG is notordered today.    Recent Labs: No results found for requested labs within last 8760 hours.   Lipid Panel No results found for: CHOL, TRIG, HDL, CHOLHDL, VLDL, LDLCALC, LDLDIRECT  Additional studies/ records that were reviewed today include:  Nuclear stress test 10/2/18Study Highlights   Nuclear stress EF: 73%. The left ventricular ejection fraction is hyperdynamic (>65%).  The study is normal.  This is a low risk study.     2-D echo 10/1/18Study Conclusions   - Left ventricle: The cavity size was normal. Wall thickness was   increased in a pattern of mild LVH. Systolic function was normal.   The estimated ejection fraction was in the range of 60% to 65%.   Doppler parameters are consistent with abnormal  left ventricular   relaxation (grade 1 diastolic dysfunction). - Mitral valve: Calcified annulus.       ASSESSMENT:    1. Chronic diastolic CHF (congestive heart failure) (HCC)   2. Chest pain, unspecified type   3. Morbid obesity ([place])      PLAN:  In order of problems listed above:   Chronic diastolic CHF compensated. Normal LV function on 2-D echo with grade 1 DD reviewed with patient. Recommend 2 g sodium diet and good blood pressure control.  Chest pain atypical felt secondary to CHF. Lexi scan Myoview low risk negative  for ischemia.  Morbid obesity weight loss recommended  Hypothyroidism and not tolerating levothyroxine very well. Recommend she follow-up with primary care.  Essential HTN-controlled on lisinopril, 2 gm Na diet   Medication Adjustments/Labs and Tests Ordered: Current medicines are reviewed at length with the patient today.  Concerns regarding medicines are outlined above.  Medication changes, Labs and Tests ordered today are listed in the Patient Instructions below. Patient Instructions  Medication Instructions:  Your physician recommends that you continue on your current medications as directed. Please refer to the Current Medication list given to you today.   Labwork: None ordered  Testing/Procedures: None ordered  Follow-Up: Your physician wants you to follow-up in: [place] DR. Mallie Snooks will receive a reminder letter in the mail two months in advance. If you don't receive a letter, please call our office to schedule the follow-up appointment.   Any Other Special Instructions Will Be Listed Below (If Applicable).     If you need a refill on your cardiac medications before your next appointment, please call your pharmacy.      Signed, Ermalinda Barrios, PA-C  12/07/2016 1:07 PM    [facility] [address], [place], [place]  [address] Phone: [phone]; Fax: [phone]

## 2016-12-07 NOTE — Patient Instructions (Signed)
Medication Instructions:  Your physician recommends that you continue on your current medications as directed. Please refer to the Current Medication list given to you today.   Labwork: None ordered  Testing/Procedures: None ordered  Follow-Up: Your physician wants you to follow-up in: 6 MONTHS WITH DR. TURNER.  You will receive a reminder letter in the mail two months in advance. If you don't receive a letter, please call our office to schedule the follow-up appointment.    Any Other Special Instructions Will Be Listed Below (If Applicable).    If you need a refill on your cardiac medications before your next appointment, please call your pharmacy.   

## 2017-01-18 ENCOUNTER — Ambulatory Visit: Payer: Medicare HMO | Admitting: Neurology

## 2017-03-14 ENCOUNTER — Other Ambulatory Visit: Payer: Self-pay

## 2017-08-19 ENCOUNTER — Telehealth: Payer: Self-pay | Admitting: *Deleted

## 2017-08-19 ENCOUNTER — Encounter (INDEPENDENT_AMBULATORY_CARE_PROVIDER_SITE_OTHER): Payer: Self-pay

## 2017-08-19 ENCOUNTER — Encounter: Payer: Self-pay | Admitting: Cardiology

## 2017-08-19 ENCOUNTER — Ambulatory Visit: Payer: Medicare HMO | Admitting: Cardiology

## 2017-08-19 VITALS — BP 124/64 | HR 91 | Ht 64.0 in | Wt 249.0 lb

## 2017-08-19 DIAGNOSIS — I5032 Chronic diastolic (congestive) heart failure: Secondary | ICD-10-CM | POA: Insufficient documentation

## 2017-08-19 DIAGNOSIS — I10 Essential (primary) hypertension: Secondary | ICD-10-CM

## 2017-08-19 DIAGNOSIS — G4733 Obstructive sleep apnea (adult) (pediatric): Secondary | ICD-10-CM

## 2017-08-19 NOTE — Patient Instructions (Signed)
Medication Instructions:  Your physician recommends that you continue on your current medications as directed. Please refer to the Current Medication list given to you today.  If you need a refill on your cardiac medications, please contact your pharmacy first.  Labwork: Today for kidney function test (BMET) and BNP  Testing/Procedures: None ordered   Follow-Up: Your physician wants you to follow-up in: 1 year with Dr. Radford Pax. You will receive a reminder letter in the mail two months in advance. If you don't receive a letter, please call our office to schedule the follow-up appointment.  Any Other Special Instructions Will Be Listed Below (If Applicable). Your physician has ordered you a new CPAP and chin strap through [facility].   Your physician recommends you wear compression socks during the day to help with swelling   Thank you for choosing [facility], RN  [phone]  If you need a refill on your cardiac medications before your next appointment, please call your pharmacy.

## 2017-08-19 NOTE — Progress Notes (Signed)
Cardiology Office Note:    Date:  08/19/2017   ID:  Kaitlin Berry, DOB 11/26/1945, MRN [id number]  PCP:  Christain Sacramento, MD  Cardiologist:  No primary care provider on file.    Referring MD: Christain Sacramento, MD   Chief Complaint  Patient presents with  . Sleep Apnea  . Hypertension  . Congestive Heart Failure    History of Present Illness:    Kaitlin Berry is a 72 y.o. female with a hx of morbid obesity, chronic back pain, DM, HTN and hyperlipidemia originally saw me last fall for shortness of breath.  At that time it was felt that her shortness of breath was multifactorial due to morbid obesity, sedentary lifestyle and diastolic CHF.  She has a strong family history of coronary disease.  Work-up included 2D echocardiogram showing normal LV function with EF 60 to 65% and grade 1 diastolic dysfunction.  Nuclear stress test showed no inducible ischemia.  She has had some  chest pain in the past that was felt to be related to CHF and resolved with diuresis.She is here today for followup and is doing well.  She denies any chest pain or pressure.  She has chronic DOE likely multifactorial from sedentary state and morbid obesity as well as diastolic heart failure.  She denies any PND, orthopnea,  dizziness, palpitations or syncope.  His chronic lower extremity edema and has stopped using Lasix because she said she did not really notice a difference.  Unfortunately she does not elevate her legs when she is sitting and she spends a lot of time sitting at her computer with her legs hanging down.  She is compliant with her meds and is tolerating meds with no SE.    Past Medical History:  Diagnosis Date  . Acute cystitis 11/27/2011  . Anxiety   . Arthritis    rt knee  . BACK PAIN 03/24/2007   Qualifier: Diagnosis of  By: Gwenette Greet MD, Armando Reichert   . Chronic back pain   . Depression   . DEPRESSION 03/24/2007   Qualifier: Diagnosis of  By: Gwenette Greet MD, Armando Reichert   . Diabetes mellitus without complication  ([place])   . GERD (gastroesophageal reflux disease)   . Hyperlipidemia   . HYPERSOMNIA 03/24/2007   Qualifier: Diagnosis of  By: Gwenette Greet MD, Armando Reichert   . Hypertension   . Morbid obesity with BMI of 45.0-49.9, adult ([place])   . Obesity 04/22/2011  . OBSTRUCTIVE SLEEP APNEA 05/17/2007   severe with AHI 35.1 on PSG 2009 - on CPAP  . Sleep apnea    CPAP  . Urticaria 11/27/2011    Past Surgical History:  Procedure Laterality Date  . ABDOMINAL HYSTERECTOMY    . BACK SURGERY     lumbar  . BREAST LUMPECTOMY WITH RADIOACTIVE SEED LOCALIZATION Right 10/01/2014   Procedure: RIGHT BREAST LUMPECTOMY WITH RADIOACTIVE SEED LOCALIZATION;  Surgeon: Donnie Mesa, MD;  Location: [facility];  Service: General;  Laterality: Right;  . CHOLECYSTECTOMY    . INCISIONAL BREAST BIOPSY     multiple  . SPINE SURGERY      Current Medications: Current Meds  Medication Sig  . atorvastatin (LIPITOR) 20 MG tablet Take 20 mg by mouth daily.  . citalopram (CELEXA) 40 MG tablet Take 40 mg by mouth daily.  . furosemide (LASIX) 40 MG tablet Take 40 mg by mouth daily.  Marland Kitchen HYDROcodone-acetaminophen (NORCO/VICODIN) 5-325 MG per tablet Take 1 tablet by mouth every 4 (four)  hours as needed.  Marland Kitchen levothyroxine (SYNTHROID, LEVOTHROID) 25 MCG tablet Take 25 mcg by mouth daily.  . meloxicam (MOBIC) 15 MG tablet Take 15 mg by mouth daily.  . metFORMIN (GLUCOPHAGE) 500 MG tablet Take 500 mg by mouth 4 (four) times daily.  . ranitidine (ZANTAC) 150 MG tablet Take 150 mg by mouth 2 (two) times daily.  . TRULICITY 2.42 AS/3.4HD SOPN      Allergies:   Atorvastatin; Ciprofloxacin; Ciprofloxacin hcl; and Lisinopril   Social History   Socioeconomic History  . Marital status: Married    Spouse name: Not on file  . Number of children: Not on file  . Years of education: Not on file  . Highest education level: Not on file  Occupational History  . Not on file  Social Needs  . Financial resource strain: Not on file  .  Food insecurity:    Worry: Not on file    Inability: Not on file  . Transportation needs:    Medical: Not on file    Non-medical: Not on file  Tobacco Use  . Smoking status: Never Smoker  . Smokeless tobacco: Never Used  Substance and Sexual Activity  . Alcohol use: Yes    Comment: social  . Drug use: No  . Sexual activity: Not on file  Lifestyle  . Physical activity:    Days per week: Not on file    Minutes per session: Not on file  . Stress: Not on file  Relationships  . Social connections:    Talks on phone: Not on file    Gets together: Not on file    Attends religious service: Not on file    Active member of club or organization: Not on file    Attends meetings of clubs or organizations: Not on file    Relationship status: Not on file  Other Topics Concern  . Not on file  Social History Narrative  . Not on file     Family History: The patient's family history includes Alzheimer's disease in her mother; Cancer in her father; Diabetes in her brother and father; Heart attack in her brother; Heart disease in her father; Lung cancer in her brother; Stroke in her brother.  ROS:   Please see the history of present illness.    ROS  All other systems reviewed and negative.   EKGs/Labs/Other Studies Reviewed:    The following studies were reviewed today: none  EKG:  EKG is not ordered today.    Recent Labs: No results found for requested labs within last 8760 hours.   Recent Lipid Panel No results found for: CHOL, TRIG, HDL, CHOLHDL, VLDL, LDLCALC, LDLDIRECT  Physical Exam:    VS:  BP 124/64   Pulse 91   Ht 5\' 4"  (1.626 m)   Wt 249 lb (112.9 kg)   SpO2 98%   BMI 42.74 kg/m     Wt Readings from Last 3 Encounters:  08/19/17 249 lb (112.9 kg)  12/07/16 277 lb (125.6 kg)  11/29/16 273 lb (123.8 kg)     GEN:  Well nourished, well developed in no acute distress HEENT: Normal NECK: No JVD; No carotid bruits LYMPHATICS: No lymphadenopathy CARDIAC: RRR, no  murmurs, rubs, gallops RESPIRATORY:  Clear to auscultation without rales, wheezing or rhonchi  ABDOMEN: Soft, non-tender, non-distended MUSCULOSKELETAL: 1+ bilateral edema; No deformity  SKIN: Warm and dry NEUROLOGIC:  Alert and oriented x 3 PSYCHIATRIC:  Normal affect   ASSESSMENT:    1. Chronic  diastolic heart failure ([place])   2. Essential hypertension   3. OBSTRUCTIVE SLEEP APNEA   4. Morbid obesity ([place])    PLAN:    In order of problems listed above:  1.  Chronic diastolic CHF  -she appears euvolemic on exam today.  Her weight is stable and she is actually lost 28 pounds with dieting.  She is not really been taking the Lasix recently because she does not feel like it helps with her lower extremity edema.  She still has some lower extremity edema on exam.  I recommended getting her prescription for compression hose.  She does have some shortness of breath but I really think is probably due to significant sedentary state.  I think her lower extremity edema is also secondary to sitting at the computer all day with her legs hanging down.  I am to check a BNP and bmet today and if BNP is elevated would recommend she go back on the Lasix and she does have chronic diastolic heart failure.  Creatinine was normal at 0.8 on 07/28/2017 and potassium was 4.2.  She is following a strict low-sodium diet.  2.  HTN - BP is well controlled on exam today.  She fell up and allergy to lisinopril and is now on another blood pressure medicine that she cannot remember what it is but she will call our office and let us know.  3.  OSA - the patient is tolerating PAP therapy well without any problems.   The patient has been using and benefiting from PAP use and will continue to benefit from therapy. I will refer her to [facility] for supplies and she will bring her chip in next week for a download.  She is having some problems with dry mouth so I will order a chinstrap as well.  4.  Obesity - I have  encouraged her to continue to cut back on carbs and portions.  She is chronically debilitated and therefore cannot get into a routine exercise program.   Medication Adjustments/Labs and Tests Ordered: Current medicines are reviewed at length with the patient today.  Concerns regarding medicines are outlined above.  No orders of the defined types were placed in this encounter.  No orders of the defined types were placed in this encounter.   Signed, Fransico Him, MD  08/19/2017 2:22 PM    [facility]

## 2017-08-19 NOTE — Telephone Encounter (Signed)
Chin strap and referral to [facility] for PAP supplies

## 2017-08-19 NOTE — Telephone Encounter (Signed)
-----   Message from Teressa Senter, RN sent at 08/19/2017  3:57 PM EDT ----- Regarding: dme order  dme order placed   Thanks  Rena

## 2017-08-20 LAB — BASIC METABOLIC PANEL
BUN / CREAT RATIO: 20 (ref 12–28)
BUN: 27 mg/dL (ref 8–27)
CHLORIDE: 102 mmol/L (ref 96–106)
CO2: 20 mmol/L (ref 20–29)
CREATININE: 1.36 mg/dL — AB (ref 0.57–1.00)
Calcium: 10 mg/dL (ref 8.7–10.3)
GFR calc Af Amer: 45 mL/min/{1.73_m2} — ABNORMAL LOW (ref >59)
GFR calc non Af Amer: 39 mL/min/{1.73_m2} — ABNORMAL LOW (ref >59)
GLUCOSE: 111 mg/dL — AB (ref 65–99)
POTASSIUM: 4.6 mmol/L (ref 3.5–5.2)
SODIUM: 141 mmol/L (ref 134–144)

## 2017-08-20 LAB — PRO B NATRIURETIC PEPTIDE: NT-Pro BNP: 126 pg/mL (ref 0–301)

## 2017-10-03 NOTE — Telephone Encounter (Signed)
Patients insurance is out of network with [facility] and has been placed with [facility]. Kaitlin Berry [facility]) states they have not been able to contact the patient to verify the kind of machine or mask she has so they can get her supplies to her. I reached out to the patient to obtain this information and lmtcb.

## 2018-12-11 NOTE — Telephone Encounter (Signed)
Patient states she gets her supplies off line so she never contacted [facility] with the kind of machine or mask she has. I spoke with the patient today to obtain a d/l and asked if she could check her machine for a chip so I could get the d/l before her appointment and she states she will check and get back to me. I told the patient I would need the d/l before her appointment on Thursday. She says ok I will call you back and she took my number.

## 2018-12-13 ENCOUNTER — Telehealth: Payer: Self-pay | Admitting: Cardiology

## 2018-12-13 NOTE — Progress Notes (Deleted)
Cardiology Office Note:    Date:  12/13/2018   ID:  Kaitlin Berry, DOB 1945/07/09, MRN [id number]  PCP:  Christain Sacramento, MD  Cardiologist:  No primary care provider on file.    Referring MD: Christain Sacramento, MD   No chief complaint on file.   History of Present Illness:    Kaitlin Berry is a 73 y.o. female with a hx of morbid obesity, chronic back pain, DM, HTN and hyperlipidemia originally saw me for shortness of breath.  At that time it was felt that her shortness of breath was multifactorial due to morbid obesity, sedentary lifestyle and diastolic CHF.  She has a strong family history of coronary disease.  Work-up included 2D echocardiogram showing normal LV function with EF 60 to 65% and grade 1 diastolic dysfunction.  Nuclear stress test showed no inducible ischemia.  She has had some  chest pain in the past that was felt to be related to CHF and resolved with diuresis.  She also has hx of severe OSA with an AHI of 35/hr and has been on CPAP since 2009.  She is here today for followup and is doing well.  She has chronic DOE likely multifactorial from sedentary state and morbid obesity as well as diastolic heart.  She also has chronic LE edema. She denies any chest pain or pressure, PND, orthopnea, dizziness, palpitations or syncope. She is compliant with her meds and is tolerating meds with no SE.    Past Medical History:  Diagnosis Date  . Acute cystitis 11/27/2011  . Anxiety   . Arthritis    rt knee  . BACK PAIN 03/24/2007   Qualifier: Diagnosis of  By: Gwenette Greet MD, Armando Reichert   . Chronic back pain   . Depression   . DEPRESSION 03/24/2007   Qualifier: Diagnosis of  By: Gwenette Greet MD, Armando Reichert   . Diabetes mellitus without complication ([place])   . GERD (gastroesophageal reflux disease)   . Hyperlipidemia   . HYPERSOMNIA 03/24/2007   Qualifier: Diagnosis of  By: Gwenette Greet MD, Armando Reichert   . Hypertension   . Morbid obesity with BMI of 45.0-49.9, adult ([place])   . Obesity 04/22/2011  .  OBSTRUCTIVE SLEEP APNEA 05/17/2007   severe with AHI 35.1 on PSG 2009 - on CPAP  . Sleep apnea    CPAP  . Urticaria 11/27/2011    Past Surgical History:  Procedure Laterality Date  . ABDOMINAL HYSTERECTOMY    . BACK SURGERY     lumbar  . BREAST LUMPECTOMY WITH RADIOACTIVE SEED LOCALIZATION Right 10/01/2014   Procedure: RIGHT BREAST LUMPECTOMY WITH RADIOACTIVE SEED LOCALIZATION;  Surgeon: Donnie Mesa, MD;  Location: [facility];  Service: General;  Laterality: Right;  . CHOLECYSTECTOMY    . INCISIONAL BREAST BIOPSY     multiple  . SPINE SURGERY      Current Medications: No outpatient medications have been marked as taking for the 12/14/18 encounter (Appointment) with Sueanne Margarita, MD.     Allergies:   Atorvastatin, Ciprofloxacin, Ciprofloxacin hcl, and Lisinopril   Social History   Socioeconomic History  . Marital status: Married    Spouse name: Not on file  . Number of children: Not on file  . Years of education: Not on file  . Highest education level: Not on file  Occupational History  . Not on file  Social Needs  . Financial resource strain: Not on file  . Food insecurity    Worry: Not  on file    Inability: Not on file  . Transportation needs    Medical: Not on file    Non-medical: Not on file  Tobacco Use  . Smoking status: Never Smoker  . Smokeless tobacco: Never Used  Substance and Sexual Activity  . Alcohol use: Yes    Comment: social  . Drug use: No  . Sexual activity: Not on file  Lifestyle  . Physical activity    Days per week: Not on file    Minutes per session: Not on file  . Stress: Not on file  Relationships  . Social Herbalist on phone: Not on file    Gets together: Not on file    Attends religious service: Not on file    Active member of club or organization: Not on file    Attends meetings of clubs or organizations: Not on file    Relationship status: Not on file  Other Topics Concern  . Not on file  Social  History Narrative  . Not on file     Family History: The patient's family history includes Alzheimer's disease in her mother; Cancer in her father; Diabetes in her brother and father; Heart attack in her brother; Heart disease in her father; Lung cancer in her brother; Stroke in her brother.  ROS:   Please see the history of present illness.    ROS  All other systems reviewed and negative.   EKGs/Labs/Other Studies Reviewed:    The following studies were reviewed today: none  EKG:  EKG is  ordered today.  The ekg ordered today demonstrates ***  Recent Labs: No results found for requested labs within last 8760 hours.   Recent Lipid Panel No results found for: CHOL, TRIG, HDL, CHOLHDL, VLDL, LDLCALC, LDLDIRECT  Physical Exam:    VS:  There were no vitals taken for this visit.    Wt Readings from Last 3 Encounters:  08/19/17 249 lb (112.9 kg)  12/07/16 277 lb (125.6 kg)  11/29/16 273 lb (123.8 kg)     GEN:  Well nourished, well developed in no acute distress HEENT: Normal NECK: No JVD; No carotid bruits LYMPHATICS: No lymphadenopathy CARDIAC: RRR, no murmurs, rubs, gallops RESPIRATORY:  Clear to auscultation without rales, wheezing or rhonchi  ABDOMEN: Soft, non-tender, non-distended MUSCULOSKELETAL:  No edema; No deformity  SKIN: Warm and dry NEUROLOGIC:  Alert and oriented x 3 PSYCHIATRIC:  Normal affect   ASSESSMENT:    1. Chronic diastolic heart failure ([place])   2. Essential hypertension   3. Morbid obesity ([place])   4. OBSTRUCTIVE SLEEP APNEA    PLAN:    In order of problems listed above:  1.  Chronic diastolic CHF -She appears euvolemic on exam -continue Lasix 40mg  daily -check BMET  2.  HTN -BP controlled -she has not required any antihypertensive meds  3.  Morbid Obesity -I have encouraged her to get into a routine exercise program and cut back on carbs and portions.   4.  OSA -  The patient is tolerating PAP therapy well without any problems.   The patient has been using and benefiting from PAP use and will continue to benefit from therapy. I will get a download from the DME   Medication Adjustments/Labs and Tests Ordered: Current medicines are reviewed at length with the patient today.  Concerns regarding medicines are outlined above.  No orders of the defined types were placed in this encounter.  No orders of the defined  types were placed in this encounter.   Signed, Fransico Him, MD  12/13/2018 8:58 PM    [place]

## 2018-12-13 NOTE — Telephone Encounter (Signed)
I spoke with pt. She uses walker but does not require any other assistance.  Husband does not always assist her with ambulation. I explained visitor policy to pt and she is agreeable to coming to office visit alone.  I told her husband could drive up to front door to let her out.  I also explained she could contact her husband during the visit and place him on speaker phone.

## 2018-12-13 NOTE — Telephone Encounter (Signed)
Patient would like to bring her husband to appt tomorrow, as she doesn't drive and she sometimes loses her balance.

## 2018-12-14 ENCOUNTER — Ambulatory Visit: Payer: Medicare HMO | Admitting: Cardiology

## 2019-01-17 ENCOUNTER — Telehealth: Payer: Self-pay | Admitting: *Deleted

## 2019-01-17 NOTE — Telephone Encounter (Signed)

## 2019-01-18 ENCOUNTER — Telehealth: Payer: Self-pay | Admitting: *Deleted

## 2019-01-18 NOTE — Telephone Encounter (Signed)
-----   Message from Sueanne Margarita, MD sent at 01/17/2019  7:13 PM EST ----- Good AHI and compliance.  Continue current PAP settings.

## 2019-01-18 NOTE — Telephone Encounter (Signed)
Informed patient of compliance results and verbalized understanding was indicated. Patient is aware and agreeable to AHI being out of range at 0.47. Patient is aware and agreeable to being in compliance with machine usage Patient is aware and agreeable to no change in current pressures Pt is aware and agreeable to treatment.

## 2019-01-19 ENCOUNTER — Telehealth (INDEPENDENT_AMBULATORY_CARE_PROVIDER_SITE_OTHER): Payer: Medicare HMO | Admitting: Cardiology

## 2019-01-19 ENCOUNTER — Encounter: Payer: Self-pay | Admitting: Cardiology

## 2019-01-19 ENCOUNTER — Other Ambulatory Visit: Payer: Self-pay

## 2019-01-19 VITALS — BP 120/79 | HR 86 | Ht 64.0 in | Wt 250.0 lb

## 2019-01-19 DIAGNOSIS — G4733 Obstructive sleep apnea (adult) (pediatric): Secondary | ICD-10-CM | POA: Diagnosis not present

## 2019-01-19 DIAGNOSIS — E119 Type 2 diabetes mellitus without complications: Secondary | ICD-10-CM

## 2019-01-19 DIAGNOSIS — I5032 Chronic diastolic (congestive) heart failure: Secondary | ICD-10-CM | POA: Diagnosis not present

## 2019-01-19 DIAGNOSIS — I10 Essential (primary) hypertension: Secondary | ICD-10-CM | POA: Diagnosis not present

## 2019-01-19 NOTE — Progress Notes (Signed)
Virtual Visit via Telephone Note   This visit type was conducted due to national recommendations for restrictions regarding the COVID-19 Pandemic (e.g. social distancing) in an effort to limit this patient's exposure and mitigate transmission in our community.  Due to her co-morbid illnesses, this patient is at least at moderate risk for complications without adequate follow up.  This format is felt to be most appropriate for this patient at this time.  All issues noted in this document were discussed and addressed.  A limited physical exam was performed with this format.  Please refer to the patient's chart for her consent to telehealth for [facility].   Evaluation Performed:  Follow-up visit  This visit type was conducted due to national recommendations for restrictions regarding the COVID-19 Pandemic (e.g. social distancing).  This format is felt to be most appropriate for this patient at this time.  All issues noted in this document were discussed and addressed.  No physical exam was performed (except for noted visual exam findings with Video Visits).  Please refer to the patient's chart (MyChart message for video visits and phone note for telephone visits) for the patient's consent to telehealth for [facility].  Date:  01/19/2019   ID:  Kaitlin Berry, DOB December 31, 1945, MRN [id number]  Patient Location:  Home  Provider location:   [place]  PCP:  Sueanne Margarita, MD  Sleep Medicine:  Fransico Him, MD Electrophysiologist:  None   Chief Complaint:  OSA, HTN, CHF  History of Present Illness:    Kaitlin Berry is a 73 y.o. female who presents via audio/video conferencing for a telehealth visit today.    Kaitlin Berry is a 73 y.o. female with a hx of morbid obesity, chronic back pain, DM, HTN and hyperlipidemia originally saw me for shortness of breath.  At that time it was felt that her shortness of breath was multifactorial due to morbid obesity, sedentary lifestyle and  diastolic CHF.  She has a strong family history of coronary disease.  Work-up included 2D echocardiogram showing normal LV function with EF 60 to 65% and grade 1 diastolic dysfunction.  Nuclear stress test showed no inducible ischemia. She also has OSA and is on CPAP.   She is here today for followup and is doing well.  She tells me that a few months ago she was having problems breathing both at rest and with exertion.  Apparently her MD had been trying different Insulins which were causing her different side effects.  Since changing her Insulin she has not had any more problems.  She denies any chest pain or pressure, PND, orthopnea, LE edema, dizziness, palpitations or syncope. She is compliant with her meds and is tolerating meds with no SE.    She is doing well with her CPAP device and thinks that she has gotten used to it.  She tolerates the mask and feels the pressure is adequate.  Since going on CPAP she feels rested in the am but then gets sleepy during the day but thinks that it is related to her Insulin and her DM. She denies any significant mouth or nasal dryness or nasal congestion.  She does not think that he snores.    The patient does not have symptoms concerning for COVID-19 infection (fever, chills, cough, or new shortness of breath).   Prior CV studies:   The following studies were reviewed today:  PAP compliance dowlnoad  Past Medical History:  Diagnosis Date  . Acute cystitis  11/27/2011  . Anxiety   . Arthritis    rt knee  . BACK PAIN 03/24/2007   Qualifier: Diagnosis of  By: Gwenette Greet MD, Armando Reichert   . Chronic back pain   . Depression   . DEPRESSION 03/24/2007   Qualifier: Diagnosis of  By: Gwenette Greet MD, Armando Reichert   . Diabetes mellitus without complication ([place])   . GERD (gastroesophageal reflux disease)   . Hyperlipidemia   . HYPERSOMNIA 03/24/2007   Qualifier: Diagnosis of  By: Gwenette Greet MD, Armando Reichert   . Hypertension   . Morbid obesity with BMI of 45.0-49.9, adult ([place])   .  Obesity 04/22/2011  . OBSTRUCTIVE SLEEP APNEA 05/17/2007   severe with AHI 35.1 on PSG 2009 - on CPAP  . Sleep apnea    CPAP  . Urticaria 11/27/2011   Past Surgical History:  Procedure Laterality Date  . ABDOMINAL HYSTERECTOMY    . BACK SURGERY     lumbar  . BREAST LUMPECTOMY WITH RADIOACTIVE SEED LOCALIZATION Right 10/01/2014   Procedure: RIGHT BREAST LUMPECTOMY WITH RADIOACTIVE SEED LOCALIZATION;  Surgeon: Donnie Mesa, MD;  Location: [facility];  Service: General;  Laterality: Right;  . CHOLECYSTECTOMY    . INCISIONAL BREAST BIOPSY     multiple  . SPINE SURGERY       Current Meds  Medication Sig  . citalopram (CELEXA) 40 MG tablet Take 40 mg by mouth daily.  . furosemide (LASIX) 40 MG tablet Take 40 mg by mouth daily.  Marland Kitchen HYDROcodone-acetaminophen (NORCO/VICODIN) 5-325 MG per tablet Take 1 tablet by mouth every 4 (four) hours as needed.  . ranitidine (ZANTAC) 150 MG tablet Take 150 mg by mouth 2 (two) times daily.  . TRULICITY 9.73 ZH/2.9JM SOPN   . vitamin B-12 (CYANOCOBALAMIN) 1000 MCG tablet Take 1,000 mcg by mouth daily.     Allergies:   Atorvastatin, Ciprofloxacin, Ciprofloxacin hcl, and Lisinopril   Social History   Tobacco Use  . Smoking status: Never Smoker  . Smokeless tobacco: Never Used  Substance Use Topics  . Alcohol use: Yes    Comment: social  . Drug use: No     Family Hx: The patient's family history includes Alzheimer's disease in her mother; Cancer in her father; Diabetes in her brother and father; Heart attack in her brother; Heart disease in her father; Lung cancer in her brother; Stroke in her brother.  ROS:   Please see the history of present illness.     All other systems reviewed and are negative.   Labs/Other Tests and Data Reviewed:    Recent Labs: No results found for requested labs within last 8760 hours.   Recent Lipid Panel No results found for: CHOL, TRIG, HDL, CHOLHDL, LDLCALC, LDLDIRECT  Wt Readings from Last 3  Encounters:  01/19/19 250 lb (113.4 kg)  08/19/17 249 lb (112.9 kg)  12/07/16 277 lb (125.6 kg)     Objective:    Vital Signs:  BP 120/79   Pulse 86   Ht 5\' 4"  (1.626 m)   Wt 250 lb (113.4 kg)   BMI 42.91 kg/m     ASSESSMENT & PLAN:    1.  Chronic diastolic CHF -she appears euvolemic on exam today -weight is stable and she denies any SOB after she got her DM controlled and Insulin changed -she has chronic LE edema which is stable -continue Lasix 40mg  daily PRN  2.  HTN -BP controlled on exam -she has not required antihypertensive meds recently  3.  OSA - The patient is tolerating PAP therapy well without any problems. The PAP download was reviewed today and showed an AHI of 0.47/hr  with 96% compliance in using more than 4 hours nightly.  The patient has been using and benefiting from PAP use and will continue to benefit from therapy.   4.  Obesity -her exercise is limited due to DJD of her knee  5.  Type 2 DM -followed by PCP -continue Trulicity and [place]  [id number] Education: The signs and symptoms of COVID-19 were discussed with the patient and how to seek care for testing (follow up with PCP or arrange E-visit).  The importance of social distancing was discussed today.  Patient Risk:   After full review of this patient's clinical status, I feel that they are at least moderate risk at this time.  Time:   Today, I have spent 20 minutes directly with the patient on telemedicine discussing medical problems including OSA, CHF, HTN, obesity.  We also reviewed the symptoms of COVID 19 and the ways to protect against contracting the virus with telehealth technology.  I spent an additional 5 minutes reviewing patient's chart including PAP compliance download and labs.  Medication Adjustments/Labs and Tests Ordered: Current medicines are reviewed at length with the patient today.  Concerns regarding medicines are outlined above.  Tests Ordered: No orders of the defined  types were placed in this encounter.  Medication Changes: No orders of the defined types were placed in this encounter.   Disposition:  Follow up in 1 year(s)  Signed, Fransico Him, MD  01/19/2019 3:33 PM    [facility]

## 2019-07-31 ENCOUNTER — Emergency Department (HOSPITAL_COMMUNITY): Payer: Medicare HMO

## 2019-07-31 ENCOUNTER — Encounter: Payer: Self-pay | Admitting: Cardiology

## 2019-07-31 ENCOUNTER — Inpatient Hospital Stay (HOSPITAL_COMMUNITY)
Admission: EM | Admit: 2019-07-31 | Discharge: 2019-08-05 | DRG: 291 | Disposition: A | Discharge status: Home Health | Payer: Medicare HMO | Attending: Internal Medicine | Admitting: Internal Medicine

## 2019-07-31 ENCOUNTER — Other Ambulatory Visit: Payer: Self-pay

## 2019-07-31 ENCOUNTER — Telehealth (INDEPENDENT_AMBULATORY_CARE_PROVIDER_SITE_OTHER): Payer: Medicare HMO | Admitting: Cardiology

## 2019-07-31 ENCOUNTER — Encounter (HOSPITAL_COMMUNITY): Payer: Self-pay | Admitting: Emergency Medicine

## 2019-07-31 ENCOUNTER — Telehealth: Payer: Self-pay | Admitting: Cardiology

## 2019-07-31 VITALS — BP 132/82 | HR 89 | Ht 64.0 in | Wt 263.0 lb

## 2019-07-31 DIAGNOSIS — E785 Hyperlipidemia, unspecified: Secondary | ICD-10-CM | POA: Diagnosis present

## 2019-07-31 DIAGNOSIS — Z23 Encounter for immunization: Secondary | ICD-10-CM

## 2019-07-31 DIAGNOSIS — G4733 Obstructive sleep apnea (adult) (pediatric): Secondary | ICD-10-CM | POA: Diagnosis present

## 2019-07-31 DIAGNOSIS — I10 Essential (primary) hypertension: Secondary | ICD-10-CM

## 2019-07-31 DIAGNOSIS — Z888 Allergy status to other drugs, medicaments and biological substances status: Secondary | ICD-10-CM

## 2019-07-31 DIAGNOSIS — E872 Acidosis, unspecified: Secondary | ICD-10-CM | POA: Diagnosis present

## 2019-07-31 DIAGNOSIS — E119 Type 2 diabetes mellitus without complications: Secondary | ICD-10-CM

## 2019-07-31 DIAGNOSIS — N184 Chronic kidney disease, stage 4 (severe): Secondary | ICD-10-CM | POA: Diagnosis present

## 2019-07-31 DIAGNOSIS — N183 Chronic kidney disease, stage 3 unspecified: Secondary | ICD-10-CM | POA: Diagnosis present

## 2019-07-31 DIAGNOSIS — Z20822 Contact with and (suspected) exposure to covid-19: Secondary | ICD-10-CM | POA: Diagnosis present

## 2019-07-31 DIAGNOSIS — I5033 Acute on chronic diastolic (congestive) heart failure: Secondary | ICD-10-CM | POA: Diagnosis present

## 2019-07-31 DIAGNOSIS — I5032 Chronic diastolic (congestive) heart failure: Secondary | ICD-10-CM | POA: Diagnosis not present

## 2019-07-31 DIAGNOSIS — Z881 Allergy status to other antibiotic agents status: Secondary | ICD-10-CM

## 2019-07-31 DIAGNOSIS — K219 Gastro-esophageal reflux disease without esophagitis: Secondary | ICD-10-CM | POA: Diagnosis present

## 2019-07-31 DIAGNOSIS — Z8249 Family history of ischemic heart disease and other diseases of the circulatory system: Secondary | ICD-10-CM

## 2019-07-31 DIAGNOSIS — Z823 Family history of stroke: Secondary | ICD-10-CM | POA: Diagnosis not present

## 2019-07-31 DIAGNOSIS — Z820 Family history of epilepsy and other diseases of the nervous system: Secondary | ICD-10-CM | POA: Diagnosis not present

## 2019-07-31 DIAGNOSIS — G8929 Other chronic pain: Secondary | ICD-10-CM | POA: Diagnosis present

## 2019-07-31 DIAGNOSIS — Z794 Long term (current) use of insulin: Secondary | ICD-10-CM | POA: Diagnosis not present

## 2019-07-31 DIAGNOSIS — N179 Acute kidney failure, unspecified: Secondary | ICD-10-CM | POA: Diagnosis present

## 2019-07-31 DIAGNOSIS — I509 Heart failure, unspecified: Secondary | ICD-10-CM

## 2019-07-31 DIAGNOSIS — Z833 Family history of diabetes mellitus: Secondary | ICD-10-CM | POA: Diagnosis not present

## 2019-07-31 DIAGNOSIS — M549 Dorsalgia, unspecified: Secondary | ICD-10-CM | POA: Diagnosis present

## 2019-07-31 DIAGNOSIS — N390 Urinary tract infection, site not specified: Secondary | ICD-10-CM | POA: Diagnosis present

## 2019-07-31 DIAGNOSIS — E1122 Type 2 diabetes mellitus with diabetic chronic kidney disease: Secondary | ICD-10-CM | POA: Diagnosis present

## 2019-07-31 DIAGNOSIS — I130 Hypertensive heart and chronic kidney disease with heart failure and stage 1 through stage 4 chronic kidney disease, or unspecified chronic kidney disease: Principal | ICD-10-CM | POA: Diagnosis present

## 2019-07-31 DIAGNOSIS — I5023 Acute on chronic systolic (congestive) heart failure: Secondary | ICD-10-CM | POA: Diagnosis not present

## 2019-07-31 DIAGNOSIS — I5031 Acute diastolic (congestive) heart failure: Secondary | ICD-10-CM | POA: Diagnosis not present

## 2019-07-31 DIAGNOSIS — Z6841 Body Mass Index (BMI) 40.0 and over, adult: Secondary | ICD-10-CM

## 2019-07-31 DIAGNOSIS — Z801 Family history of malignant neoplasm of trachea, bronchus and lung: Secondary | ICD-10-CM

## 2019-07-31 LAB — URINALYSIS, ROUTINE W REFLEX MICROSCOPIC
Bilirubin Urine: NEGATIVE
Glucose, UA: NEGATIVE mg/dL
Ketones, ur: NEGATIVE mg/dL
Leukocytes,Ua: NEGATIVE
Nitrite: POSITIVE — AB
Protein, ur: 100 mg/dL — AB
Specific Gravity, Urine: 1.011 (ref 1.005–1.030)
pH: 5 (ref 5.0–8.0)

## 2019-07-31 LAB — SARS CORONAVIRUS 2 BY RT PCR (HOSPITAL ORDER, PERFORMED IN ~~LOC~~ HOSPITAL LAB): SARS Coronavirus 2: NEGATIVE

## 2019-07-31 LAB — MAGNESIUM: Magnesium: 2.2 mg/dL (ref 1.7–2.4)

## 2019-07-31 LAB — BASIC METABOLIC PANEL
Anion gap: 11 (ref 5–15)
BUN: 37 mg/dL — ABNORMAL HIGH (ref 8–23)
CO2: 14 mmol/L — ABNORMAL LOW (ref 22–32)
Calcium: 8.9 mg/dL (ref 8.9–10.3)
Chloride: 108 mmol/L (ref 98–111)
Creatinine, Ser: 1.88 mg/dL — ABNORMAL HIGH (ref 0.44–1.00)
GFR calc Af Amer: 30 mL/min — ABNORMAL LOW (ref >60)
GFR calc non Af Amer: 26 mL/min — ABNORMAL LOW (ref >60)
Glucose, Bld: 231 mg/dL — ABNORMAL HIGH (ref 70–99)
Potassium: 4.9 mmol/L (ref 3.5–5.1)
Sodium: 133 mmol/L — ABNORMAL LOW (ref 135–145)

## 2019-07-31 LAB — CBC
HCT: 35.8 % — ABNORMAL LOW (ref 36.0–46.0)
Hemoglobin: 11 g/dL — ABNORMAL LOW (ref 12.0–15.0)
MCH: 29.6 pg (ref 26.0–34.0)
MCHC: 30.7 g/dL (ref 30.0–36.0)
MCV: 96.5 fL (ref 80.0–100.0)
Platelets: 271 10*3/uL (ref 150–400)
RBC: 3.71 MIL/uL — ABNORMAL LOW (ref 3.87–5.11)
RDW: 14.6 % (ref 11.5–15.5)
WBC: 11.2 10*3/uL — ABNORMAL HIGH (ref 4.0–10.5)
nRBC: 0 % (ref 0.0–0.2)

## 2019-07-31 LAB — D-DIMER, QUANTITATIVE: D-Dimer, Quant: 1.01 ug/mL-FEU — ABNORMAL HIGH (ref 0.00–0.50)

## 2019-07-31 LAB — TROPONIN I (HIGH SENSITIVITY)
Troponin I (High Sensitivity): 7 ng/L (ref <18)
Troponin I (High Sensitivity): 8 ng/L (ref <18)

## 2019-07-31 LAB — BRAIN NATRIURETIC PEPTIDE: B Natriuretic Peptide: 109.7 pg/mL — ABNORMAL HIGH (ref 0.0–100.0)

## 2019-07-31 MED ORDER — FUROSEMIDE 10 MG/ML IJ SOLN
40.0000 mg | Freq: Two times a day (BID) | INTRAMUSCULAR | Status: DC
  Administered 2019-08-01: 40 mg via INTRAVENOUS
  Filled 2019-07-31 (×2): qty 4

## 2019-07-31 MED ORDER — ACETAMINOPHEN 325 MG PO TABS
650.0000 mg | ORAL_TABLET | ORAL | Status: DC | PRN
  Administered 2019-08-01 – 2019-08-04 (×13): 650 mg via ORAL
  Filled 2019-07-31 (×13): qty 2

## 2019-07-31 MED ORDER — FUROSEMIDE 10 MG/ML IJ SOLN
40.0000 mg | Freq: Once | INTRAMUSCULAR | Status: AC
  Administered 2019-07-31: 40 mg via INTRAVENOUS
  Filled 2019-07-31: qty 4

## 2019-07-31 MED ORDER — ONDANSETRON HCL 4 MG/2ML IJ SOLN: 4.0000 mg | Freq: Four times a day (QID) | INTRAMUSCULAR | Status: DC | PRN

## 2019-07-31 MED ORDER — CITALOPRAM HYDROBROMIDE 20 MG PO TABS
40.0000 mg | ORAL_TABLET | Freq: Every day | ORAL | Status: DC
  Administered 2019-08-01 – 2019-08-05 (×5): 40 mg via ORAL
  Filled 2019-07-31 (×5): qty 2

## 2019-07-31 MED ORDER — INSULIN ASPART 100 UNIT/ML ~~LOC~~ SOLN
25.0000 [IU] | Freq: Three times a day (TID) | SUBCUTANEOUS | Status: DC
  Administered 2019-08-01 – 2019-08-03 (×6): 25 [IU] via SUBCUTANEOUS

## 2019-07-31 MED ORDER — INSULIN GLARGINE 100 UNIT/ML ~~LOC~~ SOLN
50.0000 [IU] | Freq: Every day | SUBCUTANEOUS | Status: DC
  Administered 2019-07-31 – 2019-08-02 (×3): 50 [IU] via SUBCUTANEOUS
  Filled 2019-07-31 (×4): qty 0.5

## 2019-07-31 MED ORDER — SODIUM CHLORIDE 0.9% FLUSH: 3.0000 mL | INTRAVENOUS | Status: DC | PRN

## 2019-07-31 MED ORDER — INSULIN ASPART 100 UNIT/ML ~~LOC~~ SOLN
0.0000 [IU] | Freq: Every day | SUBCUTANEOUS | Status: DC
  Administered 2019-08-03: 2 [IU] via SUBCUTANEOUS

## 2019-07-31 MED ORDER — HEPARIN SODIUM (PORCINE) 5000 UNIT/ML IJ SOLN
5000.0000 [IU] | Freq: Three times a day (TID) | INTRAMUSCULAR | Status: DC
  Administered 2019-07-31 – 2019-08-05 (×14): 5000 [IU] via SUBCUTANEOUS
  Filled 2019-07-31 (×14): qty 1

## 2019-07-31 MED ORDER — FAMOTIDINE 20 MG PO TABS
20.0000 mg | ORAL_TABLET | Freq: Every day | ORAL | Status: DC
  Administered 2019-07-31 – 2019-08-05 (×6): 20 mg via ORAL
  Filled 2019-07-31 (×6): qty 1

## 2019-07-31 MED ORDER — INSULIN ASPART 100 UNIT/ML ~~LOC~~ SOLN
0.0000 [IU] | Freq: Three times a day (TID) | SUBCUTANEOUS | Status: DC
  Administered 2019-08-01 – 2019-08-03 (×5): 3 [IU] via SUBCUTANEOUS
  Administered 2019-08-03: 2 [IU] via SUBCUTANEOUS
  Administered 2019-08-03 – 2019-08-05 (×6): 3 [IU] via SUBCUTANEOUS

## 2019-07-31 MED ORDER — SODIUM CHLORIDE 0.9% FLUSH
3.0000 mL | Freq: Two times a day (BID) | INTRAVENOUS | Status: DC
  Administered 2019-08-01 – 2019-08-05 (×8): 3 mL via INTRAVENOUS

## 2019-07-31 MED ORDER — SODIUM CHLORIDE 0.9 % IV SOLN
2.0000 g | Freq: Every day | INTRAVENOUS | Status: DC
  Administered 2019-07-31 – 2019-08-01 (×2): 2 g via INTRAVENOUS
  Filled 2019-07-31 (×3): qty 20

## 2019-07-31 MED ORDER — SODIUM CHLORIDE 0.9 % IV SOLN: 250.0000 mL | INTRAVENOUS | Status: DC | PRN

## 2019-07-31 NOTE — ED Triage Notes (Signed)
Patient arrives to ED with complaints of worsening shortness of breath and lower extremity edema for the past six weeks. Patient states that she cannot lay flat to sleep and gets SOB on short walks.

## 2019-07-31 NOTE — Progress Notes (Signed)
Pt refused cpap

## 2019-07-31 NOTE — Progress Notes (Signed)
Adjusting famotidine to 20mg  qday due to CrCl<60. Increase ceftriaxone to 2gm IV q24 due to wt.  Onnie Boer, PharmD, BCIDP, AAHIVP, CPP Infectious Disease Pharmacist 07/31/2019 9:51 PM

## 2019-07-31 NOTE — H&P (Signed)
History and Physical    ALIJAH HYDE IPJ:[id number] DOB: 09-11-1945 DOA: 07/31/2019  PCP: Sueanne Margarita, MD   Patient coming from: Home   Chief Complaint: SOB, orthopnea, weight-gain   HPI: Kaitlin Berry is a 74 y.o. female with medical history significant for OSA on CPAP, chronic diastolic CHF, insulin-dependent diabetes mellitus, and BMI 45, now presenting to the emergency department with increased shortness of breath, bilateral leg swelling, orthopnea, and weight gain.  Patient reports that she recently developed urinary urgency and frequency, was diagnosed with UTI and started on antibiotic, and stopped taking her Lasix due to the urinary frequency.  She has gone on to develop progressive shortness of breath, orthopnea, bilateral leg swelling, and reports 13 pound weight gain.  She was evaluated by her cardiologist today for these complaints and directed to the ED for further evaluation. She denies chest pain, flank pain, or fevers. Her urinary symptoms had improved with antibiotic initially until worsening again over the past couple days.    ED Course: Upon arrival to the ED, patient is found to be afebrile, saturating low 90s on room air, mildly tachypneic, and with stable blood pressure.  EKG features sinus rhythm with LAFB.  Chest x-rays negative for acute cardiopulmonary disease.  Chemistry panel is notable for glucose 231, bicarbonate 14, and creatinine 1.88, up from 1.45 a year ago.  CBC features a mild leukocytosis.  D-dimer is elevated to 1.10 and BNP is elevated to 110.  Cardiology was consulted by the ED physician, 40 mg IV Lasix was administered, and Covid PCR screening test was ordered.  Review of Systems:  All other systems reviewed and apart from HPI, are negative.  Past Medical History:  Diagnosis Date  . Acute cystitis 11/27/2011  . Anxiety   . Arthritis    rt knee  . BACK PAIN 03/24/2007   Qualifier: Diagnosis of  By: Gwenette Greet MD, Armando Reichert   . Chronic back pain   .  Depression   . DEPRESSION 03/24/2007   Qualifier: Diagnosis of  By: Gwenette Greet MD, Armando Reichert   . Diabetes mellitus without complication ([place])   . GERD (gastroesophageal reflux disease)   . Hyperlipidemia   . HYPERSOMNIA 03/24/2007   Qualifier: Diagnosis of  By: Gwenette Greet MD, Armando Reichert   . Hypertension   . Morbid obesity with BMI of 45.0-49.9, adult ([place])   . Obesity 04/22/2011  . OBSTRUCTIVE SLEEP APNEA 05/17/2007   severe with AHI 35.1 on PSG 2009 - on CPAP  . Sleep apnea    CPAP  . Urticaria 11/27/2011    Past Surgical History:  Procedure Laterality Date  . ABDOMINAL HYSTERECTOMY    . BACK SURGERY     lumbar  . BREAST LUMPECTOMY WITH RADIOACTIVE SEED LOCALIZATION Right 10/01/2014   Procedure: RIGHT BREAST LUMPECTOMY WITH RADIOACTIVE SEED LOCALIZATION;  Surgeon: Donnie Mesa, MD;  Location: [facility];  Service: General;  Laterality: Right;  . CHOLECYSTECTOMY    . INCISIONAL BREAST BIOPSY     multiple  . SPINE SURGERY       reports that she has never smoked. She has never used smokeless tobacco. She reports current alcohol use. She reports that she does not use drugs.  Allergies  Allergen Reactions  . Atorvastatin Other (See Comments)  . Ciprofloxacin Other (See Comments) and Hives    Unknown  . Ciprofloxacin Hcl Hives  . Lisinopril Rash    Family History  Problem Relation Age of Onset  . Alzheimer's disease  Mother   . Diabetes Father   . Cancer Father   . Heart disease Father   . Diabetes Brother   . Heart attack Brother   . Lung cancer Brother   . Stroke Brother      Prior to Admission medications   Medication Sig Start Date End Date Taking? Authorizing Provider  citalopram (CELEXA) 40 MG tablet Take 40 mg by mouth daily.   Yes [provider]  colchicine 0.6 MG tablet Take 0.6 mg by mouth as needed. Gout 03/21/18  Yes [provider]  FIASP FLEXTOUCH 100 UNIT/ML FlexTouch Pen Inject 50 Units into the skin See admin instructions. 50 units  per meal and if reading is 300 or more,  take additional 50 units, as needed. 06/19/19  Yes [provider]  furosemide (LASIX) 40 MG tablet Take 40 mg by mouth daily.   Yes [provider]  insulin degludec (TRESIBA FLEXTOUCH) 100 UNIT/ML SOPN FlexTouch Pen Inject 70 Units into the skin daily.    Yes [provider]  ranitidine (ZANTAC) 150 MG tablet Take 150 mg by mouth 2 (two) times daily.   Yes [provider]  sulfamethoxazole-trimethoprim (BACTRIM DS) 800-160 MG tablet Take 1 tablet by mouth 2 (two) times daily. 07/27/19  Yes [provider]  vitamin B-12 (CYANOCOBALAMIN) 1000 MCG tablet Take 1,000 mcg by mouth daily.   Yes [provider]  HYDROcodone-acetaminophen (NORCO/VICODIN) 5-325 MG per tablet Take 1 tablet by mouth every 4 (four) hours as needed. Patient not taking: Reported on 07/31/2019 10/01/14   Donnie Mesa, MD    Physical Exam: Vitals:   07/31/19 1832 07/31/19 1900 07/31/19 1945 07/31/19 2000  BP:  (!) 152/55 (!) 131/98 (!) 161/70  Pulse:  87 86 82  Resp:  (!) 23 (!) 23 (!) 23  Temp:      TempSrc:      SpO2:  94% 98% 96%  Weight: 119.3 kg     Height: 5\' 4"  (1.626 m)       Constitutional: NAD, calm  Eyes: PERTLA, lids and conjunctivae normal ENMT: Mucous membranes are moist. Posterior pharynx clear of any exudate or lesions.   Neck: normal, supple, no masses, no thyromegaly Respiratory: Dyspneic with speech. Mild tachypnea. No pallor or cyanosis.  Cardiovascular: S1 & S2 heard, regular rate and rhythm. Trace bilateral lower leg swelling. Abdomen: No distension, no tenderness, soft. Bowel sounds active.  Musculoskeletal: no clubbing / cyanosis. No joint deformity upper and lower extremities.   Skin: no significant rashes, lesions, ulcers. Warm, dry, well-perfused. Neurologic: No facial asymmetry. Sensation intact. Moving all extremities.  Psychiatric: Alert and oriented to person, place, and situation. Calm and  cooperative.    Labs and Imaging on Admission: I have personally reviewed following labs and imaging studies  CBC: Recent Labs  Lab 07/31/19 1544  WBC 11.2*  HGB 11.0*  HCT 35.8*  MCV 96.5  PLT 850   Basic Metabolic Panel: Recent Labs  Lab 07/31/19 1544 07/31/19 1858  NA 133*  --   K 4.9  --   CL 108  --   CO2 14*  --   GLUCOSE 231*  --   BUN 37*  --   CREATININE 1.88*  --   CALCIUM 8.9  --   MG  --  2.2   GFR: Estimated Creatinine Clearance: 33.9 mL/min (A) (by C-G formula based on SCr of 1.88 mg/dL (H)). Liver Function Tests: No results for input(s): AST, ALT, ALKPHOS, BILITOT, PROT, ALBUMIN  in the last 168 hours. No results for input(s): LIPASE, AMYLASE in the last 168 hours. No results for input(s): AMMONIA in the last 168 hours. Coagulation Profile: No results for input(s): INR, PROTIME in the last 168 hours. Cardiac Enzymes: No results for input(s): CKTOTAL, CKMB, CKMBINDEX, TROPONINI in the last 168 hours. BNP (last 3 results) No results for input(s): PROBNP in the last 8760 hours. HbA1C: No results for input(s): HGBA1C in the last 72 hours. CBG: No results for input(s): GLUCAP in the last 168 hours. Lipid Profile: No results for input(s): CHOL, HDL, LDLCALC, TRIG, CHOLHDL, LDLDIRECT in the last 72 hours. Thyroid Function Tests: No results for input(s): TSH, T4TOTAL, FREET4, T3FREE, THYROIDAB in the last 72 hours. Anemia Panel: No results for input(s): VITAMINB12, FOLATE, FERRITIN, TIBC, IRON, RETICCTPCT in the last 72 hours. Urine analysis:    Component Value Date/Time   COLORURINE AMBER (A) 07/31/2019 1815   APPEARANCEUR CLEAR 07/31/2019 1815   LABSPEC 1.011 07/31/2019 1815   PHURINE 5.0 07/31/2019 1815   GLUCOSEU NEGATIVE 07/31/2019 1815   HGBUR MODERATE (A) 07/31/2019 1815   BILIRUBINUR NEGATIVE 07/31/2019 1815   BILIRUBINUR NEG 11/27/2011 0922   KETONESUR NEGATIVE 07/31/2019 1815   PROTEINUR 100 (A) 07/31/2019 1815   UROBILINOGEN 1.0  11/27/2011 0922   UROBILINOGEN 0.2 06/29/2009 1325   NITRITE POSITIVE (A) 07/31/2019 1815   LEUKOCYTESUR NEGATIVE 07/31/2019 1815   Sepsis Labs: @LABRCNTIP (procalcitonin:4,lacticidven:4) )No results found for this or any previous visit (from the past 240 hour(s)).   Radiological Exams on Admission: DG Chest 2 View  Result Date: 07/31/2019 CLINICAL DATA:  Shortness of breath EXAM: CHEST - 2 VIEW COMPARISON:  11/15/2016 FINDINGS: The heart size and mediastinal contours are within normal limits. No focal airspace consolidation, pleural effusion, or pneumothorax. Mildly exaggerated thoracic kyphosis with changes of DISH or. IMPRESSION: No active cardiopulmonary disease. Electronically Signed   By: Davina Poke D.O.   On: 07/31/2019 16:52    EKG: Independently reviewed. Sinus rhythm, LAFB.   Assessment/Plan   1. Acute on chronic diastolic CHF  - Presents with increased SOB, orthopnea, leg swelling, and weight-gain concerning for acute CHF  - Echo from 2018 with EF 60-65%, mild LVH, and grade I diastolic dysfunction  - She was given Lasix 40 mg IV in ED  - Cardiology is consulting and much appreciated  - Follow-up cardiology recommendations, for now plan to continue diuresis with Lasix, update echocardiogram, monitor I/Os and daily wt, monitor renal function and electrolytes   2. CKD stage IV; metabolic acidosis   - SCr is 1.88 on admission, up from 1.45 one year ago; serum bicarbonate is 14 with normal AG  - Unclear if worsening is acute or progression of CKD  - Check urine chemistries, renally-dose medications, hold Bactrim and use Rocephin for now, monitor daily chem panel during diuresis    3. UTI  - Patient reports submitting urine for culture and starting Bactrim on 5/27, felt better initially but now reports worsening in urinary symptoms again  - She has leukocytosis on admission, but not septic  - Culture urine, continue treatment with Rocephin for now   4. OSA  - Continue  CPAP qHS   5. Insulin-dependent DM  - A1c was 14.1% a year ago   - Update A1c, continue CBG checks and insulin    6. Elevated d-dimer  - D-dimer is 1.01 in ED  - Check V/Q scan    DVT prophylaxis: sq heparin  Code Status: Full  Family Communication:  Discussed with patient  Disposition Plan:  Patient is from: Home  Anticipated d/c is to: home  Anticipated d/c date is: 08/03/19 Patient currently: Pending improvement in SOB with diuresis, echocardiogram, V/Q scan, monitoring of electrolytes and renal function   Consults called: Cardiology  Admission status: Inpatient     Vianne Bulls, MD [facility] Pager: See [email or web address]  If 7AM-7PM, please contact the daytime attending [email or web address]  07/31/2019, 9:08 PM

## 2019-07-31 NOTE — Progress Notes (Signed)
Virtual Visit via Telephone Note   This visit type was conducted due to national recommendations for restrictions regarding the COVID-19 Pandemic (e.g. social distancing) in an effort to limit this patient's exposure and mitigate transmission in our community.  Due to her co-morbid illnesses, this patient is at least at moderate risk for complications without adequate follow up.  This format is felt to be most appropriate for this patient at this time.  All issues noted in this document were discussed and addressed.  A limited physical exam was performed with this format.  Please refer to the patient's chart for her consent to telehealth for [facility].   Evaluation Performed:  Follow-up visit  This visit type was conducted due to national recommendations for restrictions regarding the COVID-19 Pandemic (e.g. social distancing).  This format is felt to be most appropriate for this patient at this time.  All issues noted in this document were discussed and addressed.  No physical exam was performed (except for noted visual exam findings with Video Visits).  Please refer to the patient's chart (MyChart message for video visits and phone note for telephone visits) for the patient's consent to telehealth for [facility].  Date:  07/31/2019   ID:  Kaitlin Berry, DOB April 08, 1945, MRN [id number]  Patient Location:  Home  Provider location:   [place]  PCP:  Sueanne Margarita, MD  Sleep Medicine:  Fransico Him, MD Electrophysiologist:  None   Chief Complaint:  OSA, HTN, CHF  History of Present Illness:    Kaitlin Berry is a 74 y.o. female who presents via audio/video conferencing for a telehealth visit today.    Kaitlin Berry is a 74 y.o. female with a hx of morbid obesity, chronic back pain, DM, HTN and hyperlipidemia originally saw me for shortness of breath.  At that time it was felt that her shortness of breath was multifactorial due to morbid obesity, sedentary lifestyle and  diastolic CHF.  She has a strong family history of coronary disease.  Work-up included 2D echocardiogram showing normal LV function with EF 60 to 65% and grade 1 diastolic dysfunction.  Nuclear stress test showed no inducible ischemia. She also has OSA and is on CPAP.   She was supposed to see me in the fall but recently has been having SOB and is now here for evaluation.  She tells me that about 6 weeks ago she had been in bed and got up to go to the bathroom and became profoundly diaphoretic and nauseated with a bad feeling in her torso and had to go lay down.  Since then she has not felt well.  She has continued to have DOE which now has gotten to the point that she cannot walk to the bathroom without severe SOB.  She also has had problems with LE edema.  She has been using her CPAP at night but still is very SOB and now is having problems sleeping.  Last night she was so SOB she could not use her PAP.  She denies any fever, chills or chest pain.   Prior CV studies:   The following studies were reviewed today:  none  Past Medical History:  Diagnosis Date  . Acute cystitis 11/27/2011  . Anxiety   . Arthritis    rt knee  . BACK PAIN 03/24/2007   Qualifier: Diagnosis of  By: Gwenette Greet MD, Armando Reichert   . Chronic back pain   . Depression   . DEPRESSION 03/24/2007  Qualifier: Diagnosis of  By: Gwenette Greet MD, Armando Reichert   . Diabetes mellitus without complication ([place])   . GERD (gastroesophageal reflux disease)   . Hyperlipidemia   . HYPERSOMNIA 03/24/2007   Qualifier: Diagnosis of  By: Gwenette Greet MD, Armando Reichert   . Hypertension   . Morbid obesity with BMI of 45.0-49.9, adult ([place])   . Obesity 04/22/2011  . OBSTRUCTIVE SLEEP APNEA 05/17/2007   severe with AHI 35.1 on PSG 2009 - on CPAP  . Sleep apnea    CPAP  . Urticaria 11/27/2011   Past Surgical History:  Procedure Laterality Date  . ABDOMINAL HYSTERECTOMY    . BACK SURGERY     lumbar  . BREAST LUMPECTOMY WITH RADIOACTIVE SEED LOCALIZATION Right 10/01/2014    Procedure: RIGHT BREAST LUMPECTOMY WITH RADIOACTIVE SEED LOCALIZATION;  Surgeon: Donnie Mesa, MD;  Location: [facility];  Service: General;  Laterality: Right;  . CHOLECYSTECTOMY    . INCISIONAL BREAST BIOPSY     multiple  . SPINE SURGERY       Current Meds  Medication Sig  . citalopram (CELEXA) 40 MG tablet Take 40 mg by mouth daily.  . colchicine 0.6 MG tablet Take 0.6 mg by mouth as needed. Gout  . furosemide (LASIX) 40 MG tablet Take 40 mg by mouth daily.  Marland Kitchen HYDROcodone-acetaminophen (NORCO/VICODIN) 5-325 MG per tablet Take 1 tablet by mouth every 4 (four) hours as needed.  . insulin aspart (NOVOLOG) 100 UNIT/ML FlexPen 50 units three times daily with meals  . insulin degludec (TRESIBA FLEXTOUCH) 100 UNIT/ML SOPN FlexTouch Pen Inject 30 Units into the skin daily.  . ranitidine (ZANTAC) 150 MG tablet Take 150 mg by mouth 2 (two) times daily.  . vitamin B-12 (CYANOCOBALAMIN) 1000 MCG tablet Take 1,000 mcg by mouth daily.     Allergies:   Atorvastatin, Ciprofloxacin, Ciprofloxacin hcl, and Lisinopril   Social History   Tobacco Use  . Smoking status: Never Smoker  . Smokeless tobacco: Never Used  Substance Use Topics  . Alcohol use: Yes    Comment: social  . Drug use: No     Family Hx: The patient's family history includes Alzheimer's disease in her mother; Cancer in her father; Diabetes in her brother and father; Heart attack in her brother; Heart disease in her father; Lung cancer in her brother; Stroke in her brother.  ROS:   Please see the history of present illness.     All other systems reviewed and are negative.   Labs/Other Tests and Data Reviewed:    Recent Labs: No results found for requested labs within last 8760 hours.   Recent Lipid Panel No results found for: CHOL, TRIG, HDL, CHOLHDL, LDLCALC, LDLDIRECT  Wt Readings from Last 3 Encounters:  07/31/19 263 lb (119.3 kg)  01/19/19 250 lb (113.4 kg)  08/19/17 249 lb (112.9 kg)      Objective:    Vital Signs:  BP 132/82   Pulse 89   Ht 5\' 4"  (1.626 m)   Wt 263 lb (119.3 kg)   BMI 45.14 kg/m     ASSESSMENT & PLAN:    1.  Chronic diastolic CHF -she has had worsening SOB over the past 6 weeks associated with 13lb weight gain and LE edema -now so SOB that she cannot walk to the bathroom and is not able to lay flat to sleep at night -she has chronic LE edema which has worsened -I have recommended that she proceed to the ER for further assessment  including workup for possible acute CHF exacerbation, rule out acute PE/PNA/MI -patient agrees to go to ER now  2.  HTN -Bp normal today -she has not required antihypertensive meds recently  3.  OSA -  The patient is tolerating PAP therapy well without any problems.  The patient has been using and benefiting from PAP use and will continue to benefit from therapy.   4.  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  Type 2 DM -followed by PCP -continue Trulicity and [place]   Patient Risk:   After full review of this patient's clinical status, I feel that they are at least moderate risk at this time.  Time:   Today, I have spent 20 minutes directly with the patient on telemedicine discussing medical problems including OSA, CHF, HTN, obesity and reviewing patient's chart   Medication Adjustments/Labs and Tests Ordered: Current medicines are reviewed at length with the patient today.  Concerns regarding medicines are outlined above.  Tests Ordered: No orders of the defined types were placed in this encounter.  Medication Changes: No orders of the defined types were placed in this encounter.   Disposition:  Follow up in 6 months  Signed, Fransico Him, MD  07/31/2019 1:40 PM    [facility]

## 2019-07-31 NOTE — ED Provider Notes (Signed)
[place] EMERGENCY DEPARTMENT Provider Note   CSN: [id number] Arrival date & time: 07/31/19  1516     History Chief Complaint  Patient presents with  . Shortness of Breath    Kaitlin Berry is a 74 y.o. female.  74 yo F with a chief complaints of shortness of breath.  Has been going on for about a week she has had orthopnea and significant issues on exertion.  Having some chest pressure with it as well.  No radiation no diaphoresis.  She has been sweating at night and she is not sure why.  She saw her cardiologist today virtually.  Concern for heart failure versus MI versus PE sent here for evaluation.  She denies cough congestion or fever.  Denies history of PE or DVT.  She does have a history of heart failure and stopped taking her Lasix because she had a UTI and was urinating more often than normal.  Recent weight gain.  The history is provided by the patient.  Shortness of Breath Severity:  Moderate Onset quality:  Gradual Timing:  Constant Progression:  Worsening Chronicity:  New Relieved by:  Nothing Worsened by:  Activity Ineffective treatments:  None tried Associated symptoms: no chest pain, no fever, no headaches, no vomiting and no wheezing        Past Medical History:  Diagnosis Date  . Acute cystitis 11/27/2011  . Anxiety   . Arthritis    rt knee  . BACK PAIN 03/24/2007   Qualifier: Diagnosis of  By: Gwenette Greet MD, Armando Reichert   . Chronic back pain   . Depression   . DEPRESSION 03/24/2007   Qualifier: Diagnosis of  By: Gwenette Greet MD, Armando Reichert   . Diabetes mellitus without complication ([place])   . GERD (gastroesophageal reflux disease)   . Hyperlipidemia   . HYPERSOMNIA 03/24/2007   Qualifier: Diagnosis of  By: Gwenette Greet MD, Armando Reichert   . Hypertension   . Morbid obesity with BMI of 45.0-49.9, adult ([place])   . Obesity 04/22/2011  . OBSTRUCTIVE SLEEP APNEA 05/17/2007   severe with AHI 35.1 on PSG 2009 - on CPAP  . Sleep apnea    CPAP  . Urticaria  11/27/2011    Patient Active Problem List   Diagnosis Date Noted  . Acute on chronic diastolic CHF (congestive heart failure) ([place]) 07/31/2019  . CKD (chronic kidney disease), stage IV ([place]) 07/31/2019  . Metabolic acidosis 32/20/2542  . Acute lower UTI 07/31/2019  . Insulin-requiring or dependent type II diabetes mellitus ([place]) 07/31/2019  . Chronic diastolic heart failure ([place]) 08/19/2017  . Hypothyroidism 12/07/2016  . HTN (hypertension) 12/07/2016  . SOB (shortness of breath) 11/17/2016  . Chest pain 11/17/2016  . Acute cystitis 11/27/2011  . Urticaria 11/27/2011  . Morbid obesity ([place]) 11/27/2011  . Obesity 04/22/2011  . OBSTRUCTIVE SLEEP APNEA 05/17/2007  . DEPRESSION 03/24/2007  . BACK PAIN 03/24/2007  . HYPERSOMNIA 03/24/2007    Past Surgical History:  Procedure Laterality Date  . ABDOMINAL HYSTERECTOMY    . BACK SURGERY     lumbar  . BREAST LUMPECTOMY WITH RADIOACTIVE SEED LOCALIZATION Right 10/01/2014   Procedure: RIGHT BREAST LUMPECTOMY WITH RADIOACTIVE SEED LOCALIZATION;  Surgeon: Donnie Mesa, MD;  Location: [facility];  Service: General;  Laterality: Right;  . CHOLECYSTECTOMY    . INCISIONAL BREAST BIOPSY     multiple  . SPINE SURGERY       OB History   No obstetric history on file.  Family History  Problem Relation Age of Onset  . Alzheimer's disease Mother   . Diabetes Father   . Cancer Father   . Heart disease Father   . Diabetes Brother   . Heart attack Brother   . Lung cancer Brother   . Stroke Brother     Social History   Tobacco Use  . Smoking status: Never Smoker  . Smokeless tobacco: Never Used  Substance Use Topics  . Alcohol use: Yes    Comment: social  . Drug use: No    Home Medications Prior to Admission medications   Medication Sig Start Date End Date Taking? Authorizing Provider  citalopram (CELEXA) 40 MG tablet Take 40 mg by mouth daily.   Yes [provider]  colchicine 0.6 MG tablet Take 0.6  mg by mouth as needed. Gout 03/21/18  Yes [provider]  FIASP FLEXTOUCH 100 UNIT/ML FlexTouch Pen Inject 50 Units into the skin See admin instructions. 50 units per meal and if reading is 300 or more,  take additional 50 units, as needed. 06/19/19  Yes [provider]  furosemide (LASIX) 40 MG tablet Take 40 mg by mouth daily.   Yes [provider]  insulin degludec (TRESIBA FLEXTOUCH) 100 UNIT/ML SOPN FlexTouch Pen Inject 70 Units into the skin daily.    Yes [provider]  ranitidine (ZANTAC) 150 MG tablet Take 150 mg by mouth 2 (two) times daily.   Yes [provider]  sulfamethoxazole-trimethoprim (BACTRIM DS) 800-160 MG tablet Take 1 tablet by mouth 2 (two) times daily. 07/27/19  Yes [provider]  vitamin B-12 (CYANOCOBALAMIN) 1000 MCG tablet Take 1,000 mcg by mouth daily.   Yes [provider]  HYDROcodone-acetaminophen (NORCO/VICODIN) 5-325 MG per tablet Take 1 tablet by mouth every 4 (four) hours as needed. Patient not taking: Reported on 07/31/2019 10/01/14   Donnie Mesa, MD    Allergies    Atorvastatin, Ciprofloxacin, Ciprofloxacin hcl, and Lisinopril  Review of Systems   Review of Systems  Constitutional: Negative for chills and fever.  HENT: Negative for congestion and rhinorrhea.   Eyes: Negative for redness and visual disturbance.  Respiratory: Positive for shortness of breath. Negative for wheezing.   Cardiovascular: Negative for chest pain and palpitations.  Gastrointestinal: Negative for nausea and vomiting.  Genitourinary: Negative for dysuria and urgency.  Musculoskeletal: Negative for arthralgias and myalgias.  Skin: Negative for pallor and wound.  Neurological: Negative for dizziness and headaches.    Physical Exam Updated Vital Signs BP (!) 161/70   Pulse 82   Temp 98.2 F (36.8 C) (Oral)   Resp (!) 23   Ht 5\' 4"  (1.626 m)   Wt 119.3 kg   SpO2 96%   BMI 45.14 kg/m   Physical Exam Vitals  and nursing note reviewed.  Constitutional:      General: She is not in acute distress.    Appearance: She is well-developed. She is not diaphoretic.  HENT:     Head: Normocephalic and atraumatic.  Eyes:     Pupils: Pupils are equal, round, and reactive to light.  Neck:     Vascular: JVD (mid neck) present.  Cardiovascular:     Rate and Rhythm: Normal rate and regular rhythm.     Heart sounds: No murmur. No friction rub. No gallop.   Pulmonary:     Effort: Pulmonary effort is normal.     Breath sounds: Examination of the right-lower field reveals rales. Examination of the left-lower field  reveals rales. Rales present. No wheezing.  Abdominal:     General: There is no distension.     Palpations: Abdomen is soft.     Tenderness: There is no abdominal tenderness.  Musculoskeletal:        General: No tenderness.     Cervical back: Normal range of motion and neck supple.     Right lower leg: Edema present.     Left lower leg: Edema present.     Comments: 1+ to bilateral lower extremities.  Skin:    General: Skin is warm and dry.  Neurological:     Mental Status: She is alert and oriented to person, place, and time.  Psychiatric:        Behavior: Behavior normal.     ED Results / Procedures / Treatments   Labs (all labs ordered are listed, but only abnormal results are displayed) Labs Reviewed  BASIC METABOLIC PANEL - Abnormal; Notable for the following components:      Result Value   Sodium 133 (*)    CO2 14 (*)    Glucose, Bld 231 (*)    BUN 37 (*)    Creatinine, Ser 1.88 (*)    GFR calc non Af Amer 26 (*)    GFR calc Af Amer 30 (*)    All other components within normal limits  CBC - Abnormal; Notable for the following components:   WBC 11.2 (*)    RBC 3.71 (*)    Hemoglobin 11.0 (*)    HCT 35.8 (*)    All other components within normal limits  URINALYSIS, ROUTINE W REFLEX MICROSCOPIC - Abnormal; Notable for the following components:   Color, Urine AMBER (*)     Hgb urine dipstick MODERATE (*)    Protein, ur 100 (*)    Nitrite POSITIVE (*)    Bacteria, UA RARE (*)    All other components within normal limits  BRAIN NATRIURETIC PEPTIDE - Abnormal; Notable for the following components:   B Natriuretic Peptide 109.7 (*)    All other components within normal limits  D-DIMER, QUANTITATIVE (NOT AT [facility]) - Abnormal; Notable for the following components:   D-Dimer, Quant 1.01 (*)    All other components within normal limits  SARS CORONAVIRUS 2 BY RT PCR (HOSPITAL ORDER, [facility])  MAGNESIUM  TROPONIN I (HIGH SENSITIVITY)  TROPONIN I (HIGH SENSITIVITY)    EKG EKG Interpretation  Date/Time:  Tuesday July 31 2019 15:25:02 EDT Ventricular Rate:  96 PR Interval:  162 QRS Duration: 100 QT Interval:  370 QTC Calculation: 467 R Axis:   -78 Text Interpretation: Normal sinus rhythm Left anterior fascicular block Septal infarct , age undetermined Abnormal ECG No significant change since last tracing Confirmed by Deno Etienne [phone]) on 07/31/2019 6:41:23 PM   Radiology DG Chest 2 View  Result Date: 07/31/2019 CLINICAL DATA:  Shortness of breath EXAM: CHEST - 2 VIEW COMPARISON:  11/15/2016 FINDINGS: The heart size and mediastinal contours are within normal limits. No focal airspace consolidation, pleural effusion, or pneumothorax. Mildly exaggerated thoracic kyphosis with changes of DISH or. IMPRESSION: No active cardiopulmonary disease. Electronically Signed   By: Davina Poke D.O.   On: 07/31/2019 16:52    Procedures Procedures (including critical care time)  Medications Ordered in ED Medications  furosemide (LASIX) injection 40 mg (40 mg Intravenous Given 07/31/19 1941)    ED Course  I have reviewed the triage vital signs and the nursing notes.  Pertinent labs &  imaging results that were available during my care of the patient were reviewed by me and considered in my medical decision making (see chart for details).     MDM Rules/Calculators/A&P                      74 yo F with a chief complaints of shortness of breath on exertion orthopnea PND and weight gain.  Most likely this heart failure by history.  She does have some faint rales in the bases and JVD to the mid neck.  She was seen by her cardiologist who wanted her worked up for possible MI versus PE.  Her D-dimer is elevated unfortunately she has worsening GFR and I am unable to get a CT angiogram.  Will order VQ scan.  Discussed with hospitalist for admission.  The patients results and plan were reviewed and discussed.   Any x-rays performed were independently reviewed by myself.   Differential diagnosis were considered with the presenting HPI.  Medications  furosemide (LASIX) injection 40 mg (40 mg Intravenous Given 07/31/19 1941)    Vitals:   07/31/19 1832 07/31/19 1900 07/31/19 1945 07/31/19 2000  BP:  (!) 152/55 (!) 131/98 (!) 161/70  Pulse:  87 86 82  Resp:  (!) 23 (!) 23 (!) 23  Temp:      TempSrc:      SpO2:  94% 98% 96%  Weight: 119.3 kg     Height: 5\' 4"  (1.626 m)       Final diagnoses:  Acute on chronic systolic congestive heart failure (HCC)    Admission/ observation were discussed with the admitting physician, patient and/or family and they are comfortable with the plan.   Final Clinical Impression(s) / ED Diagnoses Final diagnoses:  Acute on chronic systolic congestive heart failure [facility])    Rx / DC Orders ED Discharge Orders    None       Deno Etienne, DO 07/31/19 2101

## 2019-07-31 NOTE — Consult Note (Signed)
Cardiology Consult    Patient ID: Kaitlin Berry MRN: [id number], DOB/AGE: 74/03/1945   Admit date: 07/31/2019 Date of Consult: 07/31/2019  Primary Physician: Sueanne Margarita, MD Primary Cardiologist:  Sueanne Margarita, MD Requesting Provider: Deno Etienne, DO  Patient Profile    ERIE SICA is a 74 y.o. female with a history of morbid obesity, chronic back pain, DM, HTN, hyperlipidemia, OSA on CPAP, CKD stage IV, osteoarthritis requiring use of a walker, and HFpEF, with prior work-up for dyspnea notable for normal LV function with EF 60 to 65% and grade 1 diastolic dysfunction on echo and nuclear stress test with no inducible ischemia. She is being seen today for the evaluation of worsening dyspnea, weight gain, and leg swelling.   History of Present Illness    Kaitlin Berry typically is able to ambulate slowly with a walker and is limited by knee pain. About 6 weeks ago, after getting up to go to the bathroom she became diaphoretic, nauseated, and felt like she might pass out (but did not). She had to lay down and has not felt right since then. She began to develop worsening dyspnea with exertion despite taking lasix daily and experienced a 10-12 lb weight gain from her recent baseline. She was diagnosed with a UTI about 1 week ago and due to urinary frequency, stopped taking the Lasix completely. She is now to the point that she cannot walk to the bathroom without severe SOB and is having difficulty sleeping due to orthopnea.  She also has had problems with LE edema and does feel bloated. She endorses excellent compliance with CPAP. She denies any chest pain, syncope, palpitations, fevers, vomiting, or productive cough. She spoke with Dr. Radford Pax today via telehealth visit who recommended she come to the ED for evaluation.  Past Medical History   Past Medical History:  Diagnosis Date  . Acute cystitis 11/27/2011  . Anxiety   . Arthritis    rt knee  . BACK PAIN 03/24/2007   Qualifier:  Diagnosis of  By: Gwenette Greet MD, Armando Reichert   . Chronic back pain   . Depression   . DEPRESSION 03/24/2007   Qualifier: Diagnosis of  By: Gwenette Greet MD, Armando Reichert   . Diabetes mellitus without complication ([place])   . GERD (gastroesophageal reflux disease)   . Hyperlipidemia   . HYPERSOMNIA 03/24/2007   Qualifier: Diagnosis of  By: Gwenette Greet MD, Armando Reichert   . Hypertension   . Morbid obesity with BMI of 45.0-49.9, adult ([place])   . Obesity 04/22/2011  . OBSTRUCTIVE SLEEP APNEA 05/17/2007   severe with AHI 35.1 on PSG 2009 - on CPAP  . Sleep apnea    CPAP  . Urticaria 11/27/2011    Past Surgical History:  Procedure Laterality Date  . ABDOMINAL HYSTERECTOMY    . BACK SURGERY     lumbar  . BREAST LUMPECTOMY WITH RADIOACTIVE SEED LOCALIZATION Right 10/01/2014   Procedure: RIGHT BREAST LUMPECTOMY WITH RADIOACTIVE SEED LOCALIZATION;  Surgeon: Donnie Mesa, MD;  Location: [facility];  Service: General;  Laterality: Right;  . CHOLECYSTECTOMY    . INCISIONAL BREAST BIOPSY     multiple  . SPINE SURGERY       Allergies  Allergen Reactions  . Atorvastatin Other (See Comments)  . Ciprofloxacin Other (See Comments) and Hives    Unknown  . Ciprofloxacin Hcl Hives  . Lisinopril Rash   Inpatient Medications    Furosemide 22m IV x 1 dose  Family History  Family History  Problem Relation Age of Onset  . Alzheimer's disease Mother   . Diabetes Father   . Cancer Father   . Heart disease Father   . Diabetes Brother   . Heart attack Brother   . Lung cancer Brother   . Stroke Brother    She indicated that her mother is deceased. She indicated that her father is deceased. She indicated that her brother is deceased. She indicated that her maternal grandmother is deceased. She indicated that her maternal grandfather is deceased. She indicated that her paternal grandmother is deceased. She indicated that her paternal grandfather is deceased. She indicated that both of her daughters are alive. She  indicated that her son is alive.   Social History    Social History   Socioeconomic History  . Marital status: Married    Spouse name: Not on file  . Number of children: Not on file  . Years of education: Not on file  . Highest education level: Not on file  Occupational History  . Not on file  Tobacco Use  . Smoking status: Never Smoker  . Smokeless tobacco: Never Used  Substance and Sexual Activity  . Alcohol use: Yes    Comment: social  . Drug use: No  . Sexual activity: Not on file  Other Topics Concern  . Not on file  Social History Narrative  . Not on file   Social Determinants of Health   Financial Resource Strain:   . Difficulty of Paying Living Expenses:   Food Insecurity:   . Worried About Charity fundraiser in the Last Year:   . Arboriculturist in the Last Year:   Transportation Needs:   . Film/video editor (Medical):   Marland Kitchen Lack of Transportation (Non-Medical):   Physical Activity:   . Days of Exercise per Week:   . Minutes of Exercise per Session:   Stress:   . Feeling of Stress :   Social Connections:   . Frequency of Communication with Friends and Family:   . Frequency of Social Gatherings with Friends and Family:   . Attends Religious Services:   . Active Member of Clubs or Organizations:   . Attends Archivist Meetings:   Marland Kitchen Marital Status:   Intimate Partner Violence:   . Fear of Current or Ex-Partner:   . Emotionally Abused:   Marland Kitchen Physically Abused:   . Sexually Abused:      Review of Systems    A comprehensive review of 10 systems was performed with pertinent positives and negatives noted in the HPI.  Physical Exam    Blood pressure (!) 152/55, pulse 87, temperature 98.2 F (36.8 C), temperature source Oral, resp. rate (!) 23, height _0  (1.626 m), weight 119.3 kg, SpO2 94 %.    No intake or output data in the 24 hours ending 07/31/19 2025 Wt Readings from Last 3 Encounters:  07/31/19 119.3 kg  07/31/19 119.3 kg    01/19/19 113.4 kg    CONSTITUTIONAL: alert and conversant, laying in bed at about 30 degrees, in no distress HEENT: oropharynx clear and moist, conjunctiva normal, EOM intact, pupils equal, no lid lag. NECK:  No masses, JVP difficult to assess due to body habitus but appears distended CARDIOVASCULAR: Regular rhythm. No gallop, murmur, or rub. Normal S1/S2. Radial pulses intact. PULMONARY/CHEST WALL: no deformities, normal breath sounds bilaterally, normal work of breathing ABDOMINAL: soft, non-tender, non-distended EXTREMITIES: Trace to 1+ distal lower extremity edema, warm  and well-perfused SKIN: Dry and intact without apparent rashes or wounds.  NEUROLOGIC: alert, no abnormal movements, cranial nerves grossly intact.   Labs    Lab Results  Component Value Date   NA 133 (L) 07/31/2019   K 4.9 07/31/2019   CL 108 07/31/2019   CREATININE 1.88 (H) 07/31/2019   CREATININE 1.36 (H) 08/19/2017   CREATININE 1.24 (H) 09/30/2014   GFRNONAA 26 (L) 07/31/2019   GFRAA 30 (L) 07/31/2019   BUN 37 (H) 07/31/2019   CO2 14 (L) 07/31/2019   CALCIUM 8.9 07/31/2019   MG 2.2 07/31/2019   HGB 11.0 (L) 07/31/2019   PLT 271 07/31/2019   INR 1.03 06/29/2009   BNP 109.7 (H) 07/31/2019   DDIMER 1.01 (H) 07/31/2019   Recent Labs    07/31/19 1858  TROPONINIHS 7    Radiology Studies    DG Chest 2 View  Result Date: 07/31/2019 CLINICAL DATA:  Shortness of breath EXAM: CHEST - 2 VIEW COMPARISON:  11/15/2016 FINDINGS: The heart size and mediastinal contours are within normal limits. No focal airspace consolidation, pleural effusion, or pneumothorax. Mildly exaggerated thoracic kyphosis with changes of DISH or. IMPRESSION: No active cardiopulmonary disease. Electronically Signed   By: Davina Poke D.O.   On: 07/31/2019 16:52    ECG & Cardiac Imaging    TTE 11/29/2016: mild LVH, EF 60-65%, prolonged relaxation, and MAC without stenosis  Lexiscan SPECT MPI 11/29/2016: Myocardial perfusion is  normal.   The study is normal.   This is a low risk study.  Overall left ventricular systolic function was normal.    LV cavity size is normal.  Nuclear stress EF:  73%.  The left ventricular ejection fraction is hyperdynamic (>65%).    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  Assessment & Plan    Acute on chronic heart failure with preserved ejection fraction: Only minimal lower extremity edema, but JVP does appear elevated, weight is up, and symptomatology is consistent with decompensated heart failure. She is NYHA class III and is well perfused on exam. No symptoms of angina. Lower suspicion for PE, though given abnormal d-dimer, poor mobility, and borderline tachycardia, a V/Q scan is certainly reasonable.  - Lasix IV BID for goal -2L daily, can start with 83m as she seems to have responded - While diuresing: BMP/Mg BID, daily standing weights, strict I&Os, telemetry - Update TTE in the morning - Agree with V/Q to rule out PE - Ensure she has CPAP starting tomorrow night - Antihypertensive management as noted below  Hypertension: initial ED BP normal but has been persistently elevated since then.  - Start Bidil 20/37.547mBID given decompensated heart failure and AKI, can increase to TID if needed  Obesity/DM: BMI 44, A1c > 12% - Consider addition of semaglutide at discharge  Signed, ScMarykay LexMD 07/31/2019, 8:25 PM  For questions or updates, please contact   Please consult [email or web address] for contact info under Cardiology/STEMI.

## 2019-07-31 NOTE — Plan of Care (Signed)
°  Problem: Education: Goal: Knowledge of General Education information will improve Description: Including pain rating scale, medication(s)/side effects and non-pharmacologic comfort measures Outcome: Progressing   Problem: Health Behavior/Discharge Planning: Goal: Ability to manage health-related needs will improve Outcome: Progressing   Problem: Clinical Measurements: Goal: Will remain free from infection Outcome: Progressing   Problem: Clinical Measurements: Goal: Cardiovascular complication will be avoided Outcome: Progressing   Problem: Clinical Measurements: Goal: Respiratory complications will improve Outcome: Progressing   Problem: Safety: Goal: Ability to remain free from injury will improve Outcome: Progressing

## 2019-07-31 NOTE — Telephone Encounter (Signed)
Received approval from Carly to change appointment to virtual. I then reached out to patient to inform her and change appointment.

## 2019-07-31 NOTE — Telephone Encounter (Signed)
Kaitlin Berry is calling requesting her appointment scheduled for today at 1:40 PM be virtual instead of in office due to her having a UTI and having to run back and forth from the bathroom. I tried to reach Carly via secure chat and the ext but was unable to get an answer. Please advise.

## 2019-08-01 ENCOUNTER — Inpatient Hospital Stay (HOSPITAL_COMMUNITY): Payer: Medicare HMO

## 2019-08-01 DIAGNOSIS — Z794 Long term (current) use of insulin: Secondary | ICD-10-CM

## 2019-08-01 DIAGNOSIS — I5031 Acute diastolic (congestive) heart failure: Secondary | ICD-10-CM

## 2019-08-01 DIAGNOSIS — G4733 Obstructive sleep apnea (adult) (pediatric): Secondary | ICD-10-CM

## 2019-08-01 DIAGNOSIS — E119 Type 2 diabetes mellitus without complications: Secondary | ICD-10-CM

## 2019-08-01 DIAGNOSIS — I5033 Acute on chronic diastolic (congestive) heart failure: Secondary | ICD-10-CM

## 2019-08-01 DIAGNOSIS — N184 Chronic kidney disease, stage 4 (severe): Secondary | ICD-10-CM

## 2019-08-01 LAB — BASIC METABOLIC PANEL
Anion gap: 15 (ref 5–15)
BUN: 38 mg/dL — ABNORMAL HIGH (ref 8–23)
CO2: 17 mmol/L — ABNORMAL LOW (ref 22–32)
Calcium: 9.3 mg/dL (ref 8.9–10.3)
Chloride: 106 mmol/L (ref 98–111)
Creatinine, Ser: 2.17 mg/dL — ABNORMAL HIGH (ref 0.44–1.00)
GFR calc Af Amer: 25 mL/min — ABNORMAL LOW (ref >60)
GFR calc non Af Amer: 22 mL/min — ABNORMAL LOW (ref >60)
Glucose, Bld: 129 mg/dL — ABNORMAL HIGH (ref 70–99)
Potassium: 4.7 mmol/L (ref 3.5–5.1)
Sodium: 138 mmol/L (ref 135–145)

## 2019-08-01 LAB — HEMOGLOBIN A1C
Hgb A1c MFr Bld: 11.2 % — ABNORMAL HIGH (ref 4.8–5.6)
Mean Plasma Glucose: 274.74 mg/dL

## 2019-08-01 LAB — URINALYSIS, COMPLETE (UACMP) WITH MICROSCOPIC
Bilirubin Urine: NEGATIVE
Glucose, UA: NEGATIVE mg/dL
Ketones, ur: NEGATIVE mg/dL
Leukocytes,Ua: NEGATIVE
Nitrite: POSITIVE — AB
Protein, ur: NEGATIVE mg/dL
Specific Gravity, Urine: 1.006 (ref 1.005–1.030)
pH: 5 (ref 5.0–8.0)

## 2019-08-01 LAB — URINE CULTURE: Culture: <10000 — AB

## 2019-08-01 LAB — GLUCOSE, CAPILLARY
Glucose-Capillary: 176 mg/dL — ABNORMAL HIGH (ref 70–99)
Glucose-Capillary: 109 mg/dL — ABNORMAL HIGH (ref 70–99)
Glucose-Capillary: 177 mg/dL — ABNORMAL HIGH (ref 70–99)
Glucose-Capillary: 177 mg/dL — ABNORMAL HIGH (ref 70–99)
Glucose-Capillary: 154 mg/dL — ABNORMAL HIGH (ref 70–99)
Glucose-Capillary: 139 mg/dL — ABNORMAL HIGH (ref 70–99)

## 2019-08-01 LAB — ECHOCARDIOGRAM COMPLETE
Height: 64 in
Weight: 4158.4 oz

## 2019-08-01 LAB — SODIUM, URINE, RANDOM: Sodium, Ur: 115 mmol/L

## 2019-08-01 LAB — CREATININE, URINE, RANDOM: Creatinine, Urine: 23.18 mg/dL

## 2019-08-01 MED ORDER — ISOSORB DINITRATE-HYDRALAZINE 20-37.5 MG PO TABS
1.0000 | ORAL_TABLET | Freq: Three times a day (TID) | ORAL | Status: DC
  Administered 2019-08-01 – 2019-08-05 (×13): 1 via ORAL
  Filled 2019-08-01 (×13): qty 1

## 2019-08-01 MED ORDER — MELATONIN 3 MG PO TABS
6.0000 mg | ORAL_TABLET | Freq: Every evening | ORAL | Status: DC | PRN
  Administered 2019-08-01 – 2019-08-04 (×4): 6 mg via ORAL
  Filled 2019-08-01 (×4): qty 2

## 2019-08-01 MED ORDER — TECHNETIUM TO 99M ALBUMIN AGGREGATED
1.5000 | Freq: Once | INTRAVENOUS | Status: AC | PRN
  Administered 2019-08-01: 1.5 via INTRAVENOUS

## 2019-08-01 MED ORDER — PNEUMOCOCCAL VAC POLYVALENT 25 MCG/0.5ML IJ INJ
0.5000 mL | INJECTION | INTRAMUSCULAR | Status: AC
  Administered 2019-08-04: 0.5 mL via INTRAMUSCULAR
  Filled 2019-08-01: qty 0.5

## 2019-08-01 NOTE — Progress Notes (Signed)
Progress Note  Patient Name: Kaitlin Berry Date of Encounter: 08/01/2019  Primary Cardiologist: Fransico Him, MD   Subjective   Continues with some chest pain.  Breathing is improved.  Neg troponin   Inpatient Medications    Scheduled Meds: . citalopram  40 mg Oral Daily  . famotidine  20 mg Oral Daily  . furosemide  40 mg Intravenous Q12H  . heparin  5,000 Units Subcutaneous Q8H  . insulin aspart  0-15 Units Subcutaneous TID WC  . insulin aspart  0-5 Units Subcutaneous QHS  . insulin aspart  25 Units Subcutaneous TID WC  . insulin glargine  50 Units Subcutaneous QHS  . isosorbide-hydrALAZINE  1 tablet Oral TID  . sodium chloride flush  3 mL Intravenous Q12H   Continuous Infusions: . sodium chloride    . cefTRIAXone (ROCEPHIN)  IV 2 g (07/31/19 2234)   PRN Meds: sodium chloride, acetaminophen, ondansetron (ZOFRAN) IV, sodium chloride flush   Vital Signs    Vitals:   08/01/19 0021 08/01/19 0230 08/01/19 0332 08/01/19 0753  BP: (!) 173/71  (!) 179/68 (!) 155/61  Pulse: 80  82 86  Resp: 19  20 (!) 21  Temp: 97.9 F (36.6 C)  98.5 F (36.9 C) 98.2 F (36.8 C)  TempSrc: Oral  Oral Oral  SpO2: 99%  94% 93%  Weight:  117.9 kg    Height:        Intake/Output Summary (Last 24 hours) at 08/01/2019 0850 Last data filed at 08/01/2019 0844 Gross per 24 hour  Intake 190.06 ml  Output 1400 ml  Net -1209.94 ml   Last 3 Weights 08/01/2019 07/31/2019 07/31/2019  Weight (lbs) 259 lb 14.4 oz 263 lb 263 lb  Weight (kg) 117.89 kg 119.296 kg 119.296 kg      Telemetry    SR - Personally Reviewed  ECG    No new - Personally Reviewed  Physical Exam   GEN: No acute distress.   Neck: No JVD Cardiac: RRR, no murmurs, rubs, or gallops.  Respiratory: Clear to auscultation bilaterally. GI: Soft, nontender, non-distended  MS: No edema; No deformity. Neuro:  Nonfocal  Psych: Normal affect   Labs    High Sensitivity Troponin:   Recent Labs  Lab 07/31/19 1858  07/31/19 2230  TROPONINIHS 7 8      Chemistry Recent Labs  Lab 07/31/19 1544 08/01/19 0711  NA 133* 138  K 4.9 4.7  CL 108 106  CO2 14* 17*  GLUCOSE 231* 129*  BUN 37* 38*  CREATININE 1.88* 2.17*  CALCIUM 8.9 9.3  GFRNONAA 26* 22*  GFRAA 30* 25*  ANIONGAP 11 15     Hematology Recent Labs  Lab 07/31/19 1544  WBC 11.2*  RBC 3.71*  HGB 11.0*  HCT 35.8*  MCV 96.5  MCH 29.6  MCHC 30.7  RDW 14.6  PLT 271    BNP Recent Labs  Lab 07/31/19 1859  BNP 109.7*     DDimer  Recent Labs  Lab 07/31/19 1858  DDIMER 1.01*     Radiology    DG Chest 2 View  Result Date: 07/31/2019 CLINICAL DATA:  Shortness of breath EXAM: CHEST - 2 VIEW COMPARISON:  11/15/2016 FINDINGS: The heart size and mediastinal contours are within normal limits. No focal airspace consolidation, pleural effusion, or pneumothorax. Mildly exaggerated thoracic kyphosis with changes of DISH or. IMPRESSION: No active cardiopulmonary disease. Electronically Signed   By: Davina Poke D.O.   On: 07/31/2019 16:52  Cardiac Studies   TTE 11/29/2016: mild LVH, EF 60-65%, prolonged relaxation, and MAC without stenosis  Lexiscan SPECT MPI 11/29/2016: Myocardial perfusion is normal. The study is normal. This is a low risk study. Overall left ventricular systolic function was normal. LV cavity size is normal. Nuclear stress EF: 73%. The left ventricular ejection fraction is hyperdynamic (>65%).    Patient Profile     75 y.o. female with a history of morbid obesity, chronic back pain, DM, HTN, hyperlipidemia, OSA on CPAP, CKD stage IV, osteoarthritis requiring use of a walker, and HFpEF, with prior work-up for dyspnea notable for normal LV function with EF 60 to 65% and grade 1 diastolic dysfunction on echo and nuclear stress test with no inducible ischemia.   Admitted for increased dyspnea, wt gain, and edema.   Assessment & Plan    Acute on chronic heart failure with preserved ejection  fraction: She is NYHA class III and is well perfused on exam. No symptoms of angina. Lower suspicion for PE, though given abnormal d-dimer, poor mobility, and borderline tachycardia, a V/Q scan is certainly reasonable.  - Lasix IV BID for goal -2L daily, can start with 21m as she seems to have responded --neg 1209 since late yesterday. And wt down from 119 to 117 Kg.continue current dose - Update TTE in the morning - Agree with V/Q to rule out PE- not yet done - Ensure she has CPAP starting tomorrow night though refused last night  - Antihypertensive management as noted below --troponin neg 7;8  --neg nuc study 2018   Hypertension: initial ED BP normal but has been persistently elevated since then.  - Started Bidil 20/37.526mBID given decompensated heart failure and AKI, can increase to TID if needed  Obesity/DM: BMI 44, A1c > 12% - Consider addition of semaglutide at discharge  UTI per IM       For questions or updates, please contact [facility] consult [email or web address] for contact info under        Signed, LaCecilie KicksNP  08/01/2019, 8:50 AM

## 2019-08-01 NOTE — Progress Notes (Signed)
  Echocardiogram 2D Echocardiogram has been performed.  Kaitlin Berry 08/01/2019, 12:05 PM

## 2019-08-01 NOTE — Progress Notes (Signed)
[facility] PROGRESS NOTE    Progress Note  Kaitlin Berry  YKD:[id number] DOB: 11-07-1945 DOA: 07/31/2019 PCP: No primary care provider on file.     Brief Narrative:   Kaitlin Berry is an 74 y.o. female past medical history of OSA on CPAP, chronic heart failure insulin-dependent diabetes mellitus, with a BMI of 45 mm presenting to the ED with increased shortness of breath and bilateral leg swelling.  In the ED he was found to be with low sats chest x-ray was negative for any acute pulmonary disease, creatinine up to 1.8, BNP of 110 started on IV Lasix cardiology has been consulted by the ED.  Assessment/Plan:   Acute on chronic diastolic CHF (congestive heart failure) ([place]) We will start her on IV Lasix cardiology was consulted, will continue IV diuresis replete electrolyte as needed. Continue strict I's and O's and daily weights monitor renal function. 2D echo is pending. She had good diuresis overnight however creatinine bumped we will hold Lasix this afternoon. Cardiology recommended a VQ scan, she relates her breathing is somewhat better but not significantly.  Acute kidney injury on chronic kidney disease stage IV slight metabolic acidosis: With serum creatinine 1.8 on admission up from a year ago was 1.4. Up with diuresis today to 2.1, will hold diuresis today.  Possible UTI: We will start her on Bactrim 07/26/2019, UA will likely be inconclusive as well as urine culture she will start empirically on Rocephin will complete a 5-day course.  Obstructive as sleep apnea:  Excellent continue CPAP.  Insulin-dependent diabetes mellitus type 2: With an A1c of 14 a year ago, A1c today is pending continue long-acting sling per sliding scale.  Elevated D-dimer: D-dimer 1.0, VQ scan is pending   DVT prophylaxis: lovenox Family Communication:none Status is: Inpatient  Remains inpatient appropriate because:Hemodynamically unstable   Dispo: The patient is from: Home           Anticipated d/c is to: Home              Anticipated d/c date is: 2 days              Patient currently is not medically stable to d/c.         Code Status:     Code Status Orders  (From admission, onward)         Start     Ordered   07/31/19 2145  Full code  Continuous     07/31/19 2144        Code Status History    This patient has a current code status but no historical code status.   Advance Care Planning Activity        IV Access:    Peripheral IV   Procedures and diagnostic studies:   DG Chest 2 View  Result Date: 07/31/2019 CLINICAL DATA:  Shortness of breath EXAM: CHEST - 2 VIEW COMPARISON:  11/15/2016 FINDINGS: The heart size and mediastinal contours are within normal limits. No focal airspace consolidation, pleural effusion, or pneumothorax. Mildly exaggerated thoracic kyphosis with changes of DISH or. IMPRESSION: No active cardiopulmonary disease. Electronically Signed   By: Davina Poke D.O.   On: 07/31/2019 16:52     Medical Consultants:    None.  Anti-Infectives:   Rocephin  Subjective:    Kaitlin Berry she relates her breathing is better than when she came in.  Objective:    Vitals:   08/01/19 0332 08/01/19 0753 08/01/19 0940 08/01/19 1156  BP: Marland Kitchen)  179/68 (!) 155/61 140/63 (!) 148/73  Pulse: 82 86 86 98  Resp: 20 (!) 21 16 (!) 21  Temp: 98.5 F (36.9 C) 98.2 F (36.8 C) 98.7 F (37.1 C) 98.7 F (37.1 C)  TempSrc: Oral Oral Oral Oral  SpO2: 94% 93% 95% 95%  Weight:      Height:       SpO2: 95 %   Intake/Output Summary (Last 24 hours) at 08/01/2019 1327 Last data filed at 08/01/2019 1241 Gross per 24 hour  Intake 412.06 ml  Output 1900 ml  Net -1487.94 ml   Filed Weights   07/31/19 1832 08/01/19 0230  Weight: 119.3 kg 117.9 kg    Exam: General exam: In no acute distress. Respiratory system: Good air movement and clear to auscultation. Cardiovascular system: S1 & S2 heard, RRR. No  JVD. Gastrointestinal system: Abdomen is nondistended, soft and nontender.  Extremities: No pedal edema. Skin: No rashes, lesions or ulcers Psychiatry: Judgement and insight appear normal. Mood & affect appropriate.   Data Reviewed:    Labs: Basic Metabolic Panel: Recent Labs  Lab 07/31/19 1544 07/31/19 1858 08/01/19 0711  NA 133*  --  138  K 4.9  --  4.7  CL 108  --  106  CO2 14*  --  17*  GLUCOSE 231*  --  129*  BUN 37*  --  38*  CREATININE 1.88*  --  2.17*  CALCIUM 8.9  --  9.3  MG  --  2.2  --    GFR Estimated Creatinine Clearance: 29.2 mL/min (A) (by C-G formula based on SCr of 2.17 mg/dL (H)). Liver Function Tests: No results for input(s): AST, ALT, ALKPHOS, BILITOT, PROT, ALBUMIN in the last 168 hours. No results for input(s): LIPASE, AMYLASE in the last 168 hours. No results for input(s): AMMONIA in the last 168 hours. Coagulation profile No results for input(s): INR, PROTIME in the last 168 hours. COVID-19 Labs  Recent Labs    07/31/19 1858  DDIMER 1.01*    Lab Results  Component Value Date   SARSCOV2NAA NEGATIVE 07/31/2019    CBC: Recent Labs  Lab 07/31/19 1544  WBC 11.2*  HGB 11.0*  HCT 35.8*  MCV 96.5  PLT 271   Cardiac Enzymes: No results for input(s): CKTOTAL, CKMB, CKMBINDEX, TROPONINI in the last 168 hours. BNP (last 3 results) No results for input(s): PROBNP in the last 8760 hours. CBG: No results for input(s): GLUCAP in the last 168 hours. D-Dimer: Recent Labs    07/31/19 1858  DDIMER 1.01*   Hgb A1c: Recent Labs    08/01/19 0711  HGBA1C 11.2*   Lipid Profile: No results for input(s): CHOL, HDL, LDLCALC, TRIG, CHOLHDL, LDLDIRECT in the last 72 hours. Thyroid function studies: No results for input(s): TSH, T4TOTAL, T3FREE, THYROIDAB in the last 72 hours.  Invalid input(s): FREET3 Anemia work up: No results for input(s): VITAMINB12, FOLATE, FERRITIN, TIBC, IRON, RETICCTPCT in the last 72 hours. Sepsis Labs: Recent  Labs  Lab 07/31/19 1544  WBC 11.2*   Microbiology Recent Results (from the past 240 hour(s))  SARS Coronavirus 2 by RT PCR (hospital order, performed in [facility] hospital lab) Nasopharyngeal Nasopharyngeal Swab     Status: None   Collection Time: 07/31/19  9:05 PM   Specimen: Nasopharyngeal Swab  Result Value Ref Range Status   SARS Coronavirus 2 NEGATIVE NEGATIVE Final    Comment: (NOTE) SARS-CoV-2 target nucleic acids are NOT DETECTED. The SARS-CoV-2 RNA is generally detectable in upper and  lower respiratory specimens during the acute phase of infection. The lowest concentration of SARS-CoV-2 viral copies this assay can detect is 250 copies / mL. A negative result does not preclude SARS-CoV-2 infection and should not be used as the sole basis for treatment or other patient management decisions.  A negative result may occur with improper specimen collection / handling, submission of specimen other than nasopharyngeal swab, presence of viral mutation(s) within the areas targeted by this assay, and inadequate number of viral copies (<250 copies / mL). A negative result must be combined with clinical observations, patient history, and epidemiological information. Fact Sheet for Patients:   StrictlyIdeas.no Fact Sheet for Healthcare Providers: BankingDealers.co.za This test is not yet approved or cleared  by the [place] FDA and has been authorized for detection and/or diagnosis of SARS-CoV-2 by FDA under an Emergency Use Authorization (EUA).  This EUA will remain in effect (meaning this test can be used) for the duration of the COVID-19 declaration under Section 564(b)(1) of the Act, 21 U.S.C. section 360bbb-3(b)(1), unless the authorization is terminated or revoked sooner. Performed at [facility], [place] [address], [place],  [address]      Medications:   . citalopram  40 mg Oral Daily  . famotidine  20 mg  Oral Daily  . furosemide  40 mg Intravenous Q12H  . heparin  5,000 Units Subcutaneous Q8H  . insulin aspart  0-15 Units Subcutaneous TID WC  . insulin aspart  0-5 Units Subcutaneous QHS  . insulin aspart  25 Units Subcutaneous TID WC  . insulin glargine  50 Units Subcutaneous QHS  . isosorbide-hydrALAZINE  1 tablet Oral TID  . [START ON 08/02/2019] pneumococcal 23 valent vaccine  0.5 mL Intramuscular Tomorrow-1000  . sodium chloride flush  3 mL Intravenous Q12H   Continuous Infusions: . sodium chloride    . cefTRIAXone (ROCEPHIN)  IV 2 g (07/31/19 2234)      LOS: 1 day   Charlynne Cousins  [facility]  08/01/2019, 1:27 PM

## 2019-08-01 NOTE — Plan of Care (Signed)
?  Problem: Education: ?Goal: Knowledge of General Education information will improve ?Description: Including pain rating scale, medication(s)/side effects and non-pharmacologic comfort measures ?Outcome: Progressing ?  ?Problem: Health Behavior/Discharge Planning: ?Goal: Ability to manage health-related needs will improve ?Outcome: Progressing ?  ?Problem: Clinical Measurements: ?Goal: Ability to maintain clinical measurements within normal limits will improve ?Outcome: Progressing ?  ?Problem: Clinical Measurements: ?Goal: Respiratory complications will improve ?Outcome: Progressing ?  ?Problem: Clinical Measurements: ?Goal: Cardiovascular complication will be avoided ?Outcome: Progressing ?  ?

## 2019-08-01 NOTE — Progress Notes (Signed)
Patient has home CPAP at bedside.  RT assistance not needed at this time. 

## 2019-08-02 LAB — BASIC METABOLIC PANEL
Anion gap: 10 (ref 5–15)
BUN: 51 mg/dL — ABNORMAL HIGH (ref 8–23)
CO2: 16 mmol/L — ABNORMAL LOW (ref 22–32)
Calcium: 8.6 mg/dL — ABNORMAL LOW (ref 8.9–10.3)
Chloride: 110 mmol/L (ref 98–111)
Creatinine, Ser: 2.45 mg/dL — ABNORMAL HIGH (ref 0.44–1.00)
GFR calc Af Amer: 22 mL/min — ABNORMAL LOW (ref >60)
GFR calc non Af Amer: 19 mL/min — ABNORMAL LOW (ref >60)
Glucose, Bld: 184 mg/dL — ABNORMAL HIGH (ref 70–99)
Potassium: 4.8 mmol/L (ref 3.5–5.1)
Sodium: 136 mmol/L (ref 135–145)

## 2019-08-02 LAB — UREA NITROGEN, URINE: Urea Nitrogen, Ur: 190 mg/dL

## 2019-08-02 LAB — GLUCOSE, CAPILLARY
Glucose-Capillary: 164 mg/dL — ABNORMAL HIGH (ref 70–99)
Glucose-Capillary: 200 mg/dL — ABNORMAL HIGH (ref 70–99)
Glucose-Capillary: 104 mg/dL — ABNORMAL HIGH (ref 70–99)
Glucose-Capillary: 147 mg/dL — ABNORMAL HIGH (ref 70–99)

## 2019-08-02 MED ORDER — FUROSEMIDE 10 MG/ML IJ SOLN
40.0000 mg | Freq: Once | INTRAMUSCULAR | Status: AC
  Administered 2019-08-02: 40 mg via INTRAVENOUS
  Filled 2019-08-02: qty 4

## 2019-08-02 MED ORDER — METHOCARBAMOL 500 MG PO TABS
500.0000 mg | ORAL_TABLET | Freq: Three times a day (TID) | ORAL | Status: AC | PRN
  Administered 2019-08-02: 500 mg via ORAL
  Filled 2019-08-02: qty 1

## 2019-08-02 MED ORDER — LOPERAMIDE HCL 2 MG PO CAPS
2.0000 mg | ORAL_CAPSULE | ORAL | Status: DC | PRN
  Filled 2019-08-02: qty 1

## 2019-08-02 NOTE — Progress Notes (Addendum)
Progress Note  Patient Name: Kaitlin Berry Date of Encounter: 08/02/2019  Primary Cardiologist: Fransico Him, MD   Subjective   Not feeling as well, more SOB, cannot lie flat   Inpatient Medications    Scheduled Meds: . citalopram  40 mg Oral Daily  . famotidine  20 mg Oral Daily  . heparin  5,000 Units Subcutaneous Q8H  . insulin aspart  0-15 Units Subcutaneous TID WC  . insulin aspart  0-5 Units Subcutaneous QHS  . insulin aspart  25 Units Subcutaneous TID WC  . insulin glargine  50 Units Subcutaneous QHS  . isosorbide-hydrALAZINE  1 tablet Oral TID  . pneumococcal 23 valent vaccine  0.5 mL Intramuscular Tomorrow-1000  . sodium chloride flush  3 mL Intravenous Q12H   Continuous Infusions: . sodium chloride    . cefTRIAXone (ROCEPHIN)  IV 2 g (08/01/19 2150)   PRN Meds: sodium chloride, acetaminophen, melatonin, ondansetron (ZOFRAN) IV, sodium chloride flush   Vital Signs    Vitals:   08/01/19 1938 08/02/19 0208 08/02/19 0431 08/02/19 0734  BP: (!) 145/65  (!) 136/48 (!) 145/57  Pulse: 81  86 90  Resp: _0 Temp: 97.8 F (36.6 C)  97.6 F (36.4 C) 99 F (37.2 C)  TempSrc: Oral  Oral Oral  SpO2: 98%  97% 97%  Weight:  116.3 kg    Height:        Intake/Output Summary (Last 24 hours) at 08/02/2019 0922 Last data filed at 08/02/2019 0918 Gross per 24 hour  Intake 702 ml  Output 1350 ml  Net -648 ml   Last 3 Weights 08/02/2019 08/01/2019 07/31/2019  Weight (lbs) 256 lb 4.8 oz 259 lb 14.4 oz 263 lb  Weight (kg) 116.257 kg 117.89 kg 119.296 kg      Telemetry    SR - Personally Reviewed  ECG    No new - Personally Reviewed  Physical Exam   GEN: No acute distress.   Neck: No JVD Cardiac: RRR, no murmurs, rubs, or gallops.  Respiratory: Clear to diminished to auscultation bilaterally. GI: Soft, nontender, non-distended  MS: No edema; No deformity. Neuro:  Nonfocal  Psych: Normal affect   Labs    High Sensitivity Troponin:   Recent Labs  Lab  07/31/19 1858 07/31/19 2230  TROPONINIHS 7 8      Chemistry Recent Labs  Lab 07/31/19 1544 08/01/19 0711 08/02/19 0343  NA 133* 138 136  K 4.9 4.7 4.8  CL 108 106 110  CO2 14* 17* 16*  GLUCOSE 231* 129* 184*  BUN 37* 38* 51*  CREATININE 1.88* 2.17* 2.45*  CALCIUM 8.9 9.3 8.6*  GFRNONAA 26* 22* 19*  GFRAA 30* 25* 22*  ANIONGAP _1 Hematology Recent Labs  Lab 07/31/19 1544  WBC 11.2*  RBC 3.71*  HGB 11.0*  HCT 35.8*  MCV 96.5  MCH 29.6  MCHC 30.7  RDW 14.6  PLT 271    BNP Recent Labs  Lab 07/31/19 1859  BNP 109.7*     DDimer  Recent Labs  Lab 07/31/19 1858  DDIMER 1.01*     Radiology    DG Chest 2 View  Result Date: 07/31/2019 CLINICAL DATA:  Shortness of breath EXAM: CHEST - 2 VIEW COMPARISON:  11/15/2016 FINDINGS: The heart size and mediastinal contours are within normal limits. No focal airspace consolidation, pleural effusion, or pneumothorax. Mildly exaggerated thoracic kyphosis with changes of DISH or. IMPRESSION: No active cardiopulmonary disease.  Electronically Signed   By: Davina Poke D.O.   On: 07/31/2019 16:52   NM Pulmonary Perfusion  Result Date: 08/01/2019 CLINICAL DATA:  Chronic diastolic congestive heart failure EXAM: NUCLEAR MEDICINE PERFUSION LUNG SCAN TECHNIQUE: Perfusion images were obtained in multiple projections after intravenous injection of radiopharmaceutical. Ventilation scans intentionally deferred if perfusion scan and chest x-ray adequate for interpretation during COVID 19 epidemic. RADIOPHARMACEUTICALS:  1.5 mCi Tc-65mMAA COMPARISON:  Radiograph 07/31/2018 FINDINGS: No wedge-shaped peripheral perfusion defects within the LEFT or RIGHT lung to suggest acute pulmonary embolism. IMPRESSION: Normal perfusion scan.  No evidence of acute pulmonary embolism. Electronically Signed   By: SSuzy BouchardM.D.   On: 08/01/2019 15:13   ECHOCARDIOGRAM COMPLETE  Result Date: 08/01/2019    ECHOCARDIOGRAM REPORT   Patient  Name:   Kaitlin LAINEDate of Exam: 08/01/2019 Medical Rec #:  [id number]       Height:       64.0 in Accession #:    [id number]      Weight:       259.9 lb Date of Birth:  911/10/1945        BSA:          2.187 m Patient Age:    74years         BP:           148/73 mmHg Patient Gender: F                HR:           98 bpm. Exam Location:  Inpatient Procedure: 2D Echo Indications:    428.31 CHF  History:        Patient has prior history of Echocardiogram examinations, most                 recent 11/29/2016. Risk Factors:Hypertension, Diabetes and                 Dyslipidemia.  Sonographer:    VJannett CelestineRDCS (AE) Referring Phys: [id number] S OPYD  Sonographer Comments: Patient is morbidly obese. Image acquisition challenging due to patient body habitus. IMPRESSIONS  1. Left ventricular ejection fraction, by estimation, is 60 to 65%. The left ventricle has normal function. The left ventricle has no regional wall motion abnormalities. There is moderate left ventricular hypertrophy. Left ventricular diastolic parameters are consistent with Grade I diastolic dysfunction (impaired relaxation).  2. Right ventricular systolic function is normal. The right ventricular size is normal. Tricuspid regurgitation signal is inadequate for assessing PA pressure.  3. The mitral valve is normal in structure. No evidence of mitral valve regurgitation. No evidence of mitral stenosis.  4. The aortic valve is tricuspid. Aortic valve regurgitation is not visualized. Mild aortic valve sclerosis is present, with no evidence of aortic valve stenosis.  5. The inferior vena cava is normal in size with greater than 50% respiratory variability, suggesting right atrial pressure of 3 mmHg.  6. Technically difficult study with poor acoustic windows. FINDINGS  Left Ventricle: Left ventricular ejection fraction, by estimation, is 60 to 65%. The left ventricle has normal function. The left ventricle has no regional wall motion abnormalities.  The left ventricular internal cavity size was normal in size. There is  moderate left ventricular hypertrophy. Left ventricular diastolic parameters are consistent with Grade I diastolic dysfunction (impaired relaxation). Right Ventricle: The right ventricular size is normal. No increase in right ventricular wall thickness. Right ventricular systolic function is normal.  Tricuspid regurgitation signal is inadequate for assessing PA pressure. Left Atrium: Left atrial size was normal in size. Right Atrium: Right atrial size was normal in size. Pericardium: There is no evidence of pericardial effusion. Mitral Valve: The mitral valve is normal in structure. Mild mitral annular calcification. No evidence of mitral valve regurgitation. No evidence of mitral valve stenosis. Tricuspid Valve: The tricuspid valve is normal in structure. Tricuspid valve regurgitation is not demonstrated. Aortic Valve: The aortic valve is tricuspid. Aortic valve regurgitation is not visualized. Mild aortic valve sclerosis is present, with no evidence of aortic valve stenosis. Pulmonic Valve: The pulmonic valve was normal in structure. Pulmonic valve regurgitation is not visualized. Aorta: The aortic root is normal in size and structure. Venous: The inferior vena cava is normal in size with greater than 50% respiratory variability, suggesting right atrial pressure of 3 mmHg. IAS/Shunts: No atrial level shunt detected by color flow Doppler.  LEFT VENTRICLE PLAX 2D LVIDd:         3.30 cm  Diastology LVIDs:         2.10 cm  LV e' lateral:   9.25 cm/s LV PW:         1.30 cm  LV E/e' lateral: 9.0 LV IVS:        1.70 cm  LV e' medial:    6.53 cm/s LVOT diam:     1.80 cm  LV E/e' medial:  12.7 LVOT Area:     2.54 cm  RIGHT VENTRICLE RV S prime:     12.10 cm/s LEFT ATRIUM           Index LA diam:      3.10 cm 1.42 cm/m LA Vol (A2C): 39.5 ml 18.06 ml/m   AORTA Ao Root diam: 2.80 cm MITRAL VALVE MV Area (PHT): 2.96 cm     SHUNTS MV Decel Time: 256 msec      Systemic Diam: 1.80 cm MV E velocity: 82.90 cm/s MV A velocity: 117.00 cm/s MV E/A ratio:  0.71 Loralie Champagne MD Electronically signed by Loralie Champagne MD Signature Date/Time: 08/01/2019/5:59:09 PM    Final     Cardiac Studies   TTE 11/29/2016: mild LVH, EF 60-65%, prolonged relaxation, and MAC without stenosis  Lexiscan SPECT MPI 11/29/2016: Myocardial perfusion is normal. The study is normal. This is a low risk study. Overall left ventricular systolic function was normal. LV cavity size is normal. Nuclear stress EF: 73%. The left ventricular ejection fraction is hyperdynamic (>65%).  Echo 08/01/19 IMPRESSIONS    1. Left ventricular ejection fraction, by estimation, is 60 to 65%. The  left ventricle has normal function. The left ventricle has no regional  wall motion abnormalities. There is moderate left ventricular hypertrophy.  Left ventricular diastolic  parameters are consistent with Grade I diastolic dysfunction (impaired  relaxation).  2. Right ventricular systolic function is normal. The right ventricular  size is normal. Tricuspid regurgitation signal is inadequate for assessing  PA pressure.  3. The mitral valve is normal in structure. No evidence of mitral valve  regurgitation. No evidence of mitral stenosis.  4. The aortic valve is tricuspid. Aortic valve regurgitation is not  visualized. Mild aortic valve sclerosis is present, with no evidence of  aortic valve stenosis.  5. The inferior vena cava is normal in size with greater than 50%  respiratory variability, suggesting right atrial pressure of 3 mmHg.  6. Technically difficult study with poor acoustic windows.   FINDINGS  Left Ventricle: Left ventricular ejection fraction,  by estimation, is 60  to 65%. The left ventricle has normal function. The left ventricle has no  regional wall motion abnormalities. The left ventricular internal cavity  size was normal in size. There is  moderate left  ventricular hypertrophy. Left ventricular diastolic  parameters are consistent with Grade I diastolic dysfunction (impaired  relaxation).   Right Ventricle: The right ventricular size is normal. No increase in  right ventricular wall thickness. Right ventricular systolic function is  normal. Tricuspid regurgitation signal is inadequate for assessing PA  pressure.   Left Atrium: Left atrial size was normal in size.   Right Atrium: Right atrial size was normal in size.   Pericardium: There is no evidence of pericardial effusion.   Mitral Valve: The mitral valve is normal in structure. Mild mitral annular  calcification. No evidence of mitral valve regurgitation. No evidence of  mitral valve stenosis.   Tricuspid Valve: The tricuspid valve is normal in structure. Tricuspid  valve regurgitation is not demonstrated.   Aortic Valve: The aortic valve is tricuspid. Aortic valve regurgitation is  not visualized. Mild aortic valve sclerosis is present, with no evidence  of aortic valve stenosis.   Pulmonic Valve: The pulmonic valve was normal in structure. Pulmonic valve  regurgitation is not visualized.   Aorta: The aortic root is normal in size and structure.   Venous: The inferior vena cava is normal in size with greater than 50%  respiratory variability, suggesting right atrial pressure of 3 mmHg.   IAS/Shunts: No atrial level shunt detected by color flow Doppler.      Patient Profile     74 y.o. female with a history of morbid obesity, chronic back pain, DM, HTN,hyperlipidemia, OSA on CPAP,CKD stage IV, osteoarthritis requiring use of a walker,and HFpEF, with prior work-up for dyspnea notable fornormal LV function with EF 60 to 65% and grade 1 diastolic dysfunction on echo and nuclear stress testwithno inducible ischemia.  Admitted for increased dyspnea, wt gain, and edema.   Assessment & Plan    Acute on chronic heart failure with preserved ejection fraction: She is  NYHA class III and is well perfused on exam. No symptoms of angina. Lower suspicion for PE, though given abnormal d-dimer, poor mobility, and borderline tachycardia, a V/Q scan neg for PE.  - Lasix IV BID for goal -2L daily, can start with 65m as she seems to have responded --neg 1857 since late yesterday. And wt down from 119 to 116.3 Kg.continue current dose - TTE this admit with EF 60-65%, moderate LVH G1DD, RV normal,  - Ensure she has CPAP starting tomorrow night though refused last night  - Antihypertensive management as noted below --troponin neg 7;8  --neg nuc study 2018   Hypertension: initial ED BP normal but has been persistently elevated since then.  - Started Bidil 20/37.559mBID given decompensated heart failure and AKI, can increase to TID if needed- would prob increase to TID --BP 136/48 to 145/57   Obesity/DM: BMI 44, A1c > 12% - Consider addition ofsemaglutideat discharge  UTI per IM   AKI -CKD 4 Cr 2.45 lasix held last dose yest AM may need renal consult pt feeling more dyspneic off lasix      For questions or updates, please contact [email or web address] Please consult [email or web address] for contact info under        Signed, LaCecilie KicksNP  08/02/2019, 9:22 AM

## 2019-08-02 NOTE — Progress Notes (Signed)
[facility] PROGRESS NOTE    Progress Note  Kaitlin Berry  HDQ:[id number] DOB: [birth year] DOA: 07/31/2019 PCP: No primary care provider on file.     Brief Narrative:   Kaitlin Berry is an 74 y.o. female past medical history of OSA on CPAP, chronic heart failure insulin-dependent diabetes mellitus, with a BMI of 45 mm presenting to the ED with increased shortness of breath and bilateral leg swelling.  In the ED he was found to be with low sats chest x-ray was negative for any acute pulmonary disease, creatinine up to 1.8, BNP of 110 started on IV Lasix cardiology has been consulted by the ED.  Assessment/Plan:   Acute on chronic diastolic CHF (congestive heart failure) ([place]) We will start her on IV Lasix cardiology was consulted, will continue IV diuresis replete electrolyte as needed. Continue strict I's and O's and daily weights monitor renal function. He still appears fluid overloaded on physical exam. 2D echo was done that showed an EF of 55% no wall motion abnormality, grade 1 diastolic heart failure. Further diuresis per cardiology.  Acute kidney injury on chronic kidney disease stage IV slight metabolic acidosis: With serum creatinine 1.8 on admission up from a year ago was 1.4. Creatinine continues to rise it is 2.4 she appears fluid overloaded on physical exam.  Possible UTI: At home she was on Bactrim started on 07/26/2018 when she has completed a 5-day course of antibiotic discontinue all antibiotics.  Obstructive as sleep apnea:  Excellent continue CPAP.  Insulin-dependent diabetes mellitus type 2: With an A1c of 11, she is currently on long-acting insulin plus sliding scale her blood glucose remains high, I will not increase her long-acting insulin or sliding scale as his creatinine is rising, and her insulin requirements will be less.  Elevated D-dimer: D-dimer 1.0, VQ scan is negative.   DVT prophylaxis: lovenox Family Communication:none Status is:  Inpatient  Remains inpatient appropriate because:Hemodynamically unstable   Dispo: The patient is from: Home              Anticipated d/c is to: Home              Anticipated d/c date is: 2 days              Patient currently is not medically stable to d/c.         Code Status:     Code Status Orders  (From admission, onward)         Start     Ordered   07/31/19 2145  Full code  Continuous     07/31/19 2144        Code Status History    This patient has a current code status but no historical code status.   Advance Care Planning Activity        IV Access:    Peripheral IV   Procedures and diagnostic studies:   DG Chest 2 View  Result Date: 07/31/2019 CLINICAL DATA:  Shortness of breath EXAM: CHEST - 2 VIEW COMPARISON:  11/15/2016 FINDINGS: The heart size and mediastinal contours are within normal limits. No focal airspace consolidation, pleural effusion, or pneumothorax. Mildly exaggerated thoracic kyphosis with changes of DISH or. IMPRESSION: No active cardiopulmonary disease. Electronically Signed   By: Davina Poke D.O.   On: 07/31/2019 16:52   NM Pulmonary Perfusion  Result Date: 08/01/2019 CLINICAL DATA:  Chronic diastolic congestive heart failure EXAM: NUCLEAR MEDICINE PERFUSION LUNG SCAN TECHNIQUE: Perfusion images were obtained in multiple  projections after intravenous injection of radiopharmaceutical. Ventilation scans intentionally deferred if perfusion scan and chest x-ray adequate for interpretation during COVID 19 epidemic. RADIOPHARMACEUTICALS:  1.5 mCi Tc-57m MAA COMPARISON:  Radiograph 07/31/2018 FINDINGS: No wedge-shaped peripheral perfusion defects within the LEFT or RIGHT lung to suggest acute pulmonary embolism. IMPRESSION: Normal perfusion scan.  No evidence of acute pulmonary embolism. Electronically Signed   By: Suzy Bouchard M.D.   On: 08/01/2019 15:13   ECHOCARDIOGRAM COMPLETE  Result Date: 08/01/2019    ECHOCARDIOGRAM REPORT    Patient Name:   Kaitlin Berry Date of Exam: 08/01/2019 Medical Rec #:  [id number]        Height:       64.0 in Accession #:    [id number]       Weight:       259.9 lb Date of Birth:  Dec 24, 1945         BSA:          2.187 m Patient Age:    46 years         BP:           148/73 mmHg Patient Gender: F                HR:           98 bpm. Exam Location:  Inpatient Procedure: 2D Echo Indications:    428.31 CHF  History:        Patient has prior history of Echocardiogram examinations, most                 recent 11/29/2016. Risk Factors:Hypertension, Diabetes and                 Dyslipidemia.  Sonographer:    Jannett Celestine RDCS (AE) Referring Phys: [id number] TIMOTHY S OPYD  Sonographer Comments: Patient is morbidly obese. Image acquisition challenging due to patient body habitus. IMPRESSIONS  1. Left ventricular ejection fraction, by estimation, is 60 to 65%. The left ventricle has normal function. The left ventricle has no regional wall motion abnormalities. There is moderate left ventricular hypertrophy. Left ventricular diastolic parameters are consistent with Grade I diastolic dysfunction (impaired relaxation).  2. Right ventricular systolic function is normal. The right ventricular size is normal. Tricuspid regurgitation signal is inadequate for assessing PA pressure.  3. The mitral valve is normal in structure. No evidence of mitral valve regurgitation. No evidence of mitral stenosis.  4. The aortic valve is tricuspid. Aortic valve regurgitation is not visualized. Mild aortic valve sclerosis is present, with no evidence of aortic valve stenosis.  5. The inferior vena cava is normal in size with greater than 50% respiratory variability, suggesting right atrial pressure of 3 mmHg.  6. Technically difficult study with poor acoustic windows. FINDINGS  Left Ventricle: Left ventricular ejection fraction, by estimation, is 60 to 65%. The left ventricle has normal function. The left ventricle has no regional wall motion  abnormalities. The left ventricular internal cavity size was normal in size. There is  moderate left ventricular hypertrophy. Left ventricular diastolic parameters are consistent with Grade I diastolic dysfunction (impaired relaxation). Right Ventricle: The right ventricular size is normal. No increase in right ventricular wall thickness. Right ventricular systolic function is normal. Tricuspid regurgitation signal is inadequate for assessing PA pressure. Left Atrium: Left atrial size was normal in size. Right Atrium: Right atrial size was normal in size. Pericardium: There is no evidence of pericardial effusion. Mitral Valve: The mitral valve is normal in structure.  Mild mitral annular calcification. No evidence of mitral valve regurgitation. No evidence of mitral valve stenosis. Tricuspid Valve: The tricuspid valve is normal in structure. Tricuspid valve regurgitation is not demonstrated. Aortic Valve: The aortic valve is tricuspid. Aortic valve regurgitation is not visualized. Mild aortic valve sclerosis is present, with no evidence of aortic valve stenosis. Pulmonic Valve: The pulmonic valve was normal in structure. Pulmonic valve regurgitation is not visualized. Aorta: The aortic root is normal in size and structure. Venous: The inferior vena cava is normal in size with greater than 50% respiratory variability, suggesting right atrial pressure of 3 mmHg. IAS/Shunts: No atrial level shunt detected by color flow Doppler.  LEFT VENTRICLE PLAX 2D LVIDd:         3.30 cm  Diastology LVIDs:         2.10 cm  LV e' lateral:   9.25 cm/s LV PW:         1.30 cm  LV E/e' lateral: 9.0 LV IVS:        1.70 cm  LV e' medial:    6.53 cm/s LVOT diam:     1.80 cm  LV E/e' medial:  12.7 LVOT Area:     2.54 cm  RIGHT VENTRICLE RV S prime:     12.10 cm/s LEFT ATRIUM           Index LA diam:      3.10 cm 1.42 cm/m LA Vol (A2C): 39.5 ml 18.06 ml/m   AORTA Ao Root diam: 2.80 cm MITRAL VALVE MV Area (PHT): 2.96 cm     SHUNTS MV Decel  Time: 256 msec     Systemic Diam: 1.80 cm MV E velocity: 82.90 cm/s MV A velocity: 117.00 cm/s MV E/A ratio:  0.71 Loralie Champagne MD Electronically signed by Loralie Champagne MD Signature Date/Time: 08/01/2019/5:59:09 PM    Final      Medical Consultants:    None.  Anti-Infectives:   Rocephin  Subjective:    Kaitlin Berry relates her breathing is better.  Objective:    Vitals:   08/01/19 1938 08/02/19 0208 08/02/19 0431 08/02/19 0734  BP: (!) 145/65  (!) 136/48 (!) 145/57  Pulse: 81  86 90  Resp: 20  18 15   Temp: 97.8 F (36.6 C)  97.6 F (36.4 C) 99 F (37.2 C)  TempSrc: Oral  Oral Oral  SpO2: 98%  97% 97%  Weight:  116.3 kg    Height:       SpO2: 97 %   Intake/Output Summary (Last 24 hours) at 08/02/2019 1045 Last data filed at 08/02/2019 0918 Gross per 24 hour  Intake 702 ml  Output 1150 ml  Net -448 ml   Filed Weights   07/31/19 1832 08/01/19 0230 08/02/19 0208  Weight: 119.3 kg 117.9 kg 116.3 kg    Exam: General exam: In no acute distress. Respiratory system: Good air movement and clear to auscultation. Cardiovascular system: S1 & S2 heard, RRR. No JVD. Gastrointestinal system: Abdomen is nondistended, soft and nontender.  Extremities: No pedal edema. Skin: No rashes, lesions or ulcers Psychiatry: Judgement and insight appear normal.  Data Reviewed:    Labs: Basic Metabolic Panel: Recent Labs  Lab 07/31/19 1544 07/31/19 1544 07/31/19 1858 08/01/19 0711 08/02/19 0343  NA 133*  --   --  138 136  K 4.9   < >  --  4.7 4.8  CL 108  --   --  106 110  CO2 14*  --   --  17* 16*  GLUCOSE 231*  --   --  129* 184*  BUN 37*  --   --  38* 51*  CREATININE 1.88*  --   --  2.17* 2.45*  CALCIUM 8.9  --   --  9.3 8.6*  MG  --   --  2.2  --   --    < > = values in this interval not displayed.   GFR Estimated Creatinine Clearance: 25.6 mL/min (A) (by C-G formula based on SCr of 2.45 mg/dL (H)). Liver Function Tests: No results for input(s): AST, ALT,  ALKPHOS, BILITOT, PROT, ALBUMIN in the last 168 hours. No results for input(s): LIPASE, AMYLASE in the last 168 hours. No results for input(s): AMMONIA in the last 168 hours. Coagulation profile No results for input(s): INR, PROTIME in the last 168 hours. COVID-19 Labs  Recent Labs    07/31/19 1858  DDIMER 1.01*    Lab Results  Component Value Date   SARSCOV2NAA NEGATIVE 07/31/2019    CBC: Recent Labs  Lab 07/31/19 1544  WBC 11.2*  HGB 11.0*  HCT 35.8*  MCV 96.5  PLT 271   Cardiac Enzymes: No results for input(s): CKTOTAL, CKMB, CKMBINDEX, TROPONINI in the last 168 hours. BNP (last 3 results) No results for input(s): PROBNP in the last 8760 hours. CBG: Recent Labs  Lab 08/01/19 0557 08/01/19 1158 08/01/19 1554 08/01/19 2109 08/02/19 0555  GLUCAP 109* 177*  177* 154* 139* 164*   D-Dimer: Recent Labs    07/31/19 1858  DDIMER 1.01*   Hgb A1c: Recent Labs    08/01/19 0711  HGBA1C 11.2*   Lipid Profile: No results for input(s): CHOL, HDL, LDLCALC, TRIG, CHOLHDL, LDLDIRECT in the last 72 hours. Thyroid function studies: No results for input(s): TSH, T4TOTAL, T3FREE, THYROIDAB in the last 72 hours.  Invalid input(s): FREET3 Anemia work up: No results for input(s): VITAMINB12, FOLATE, FERRITIN, TIBC, IRON, RETICCTPCT in the last 72 hours. Sepsis Labs: Recent Labs  Lab 07/31/19 1544  WBC 11.2*   Microbiology Recent Results (from the past 240 hour(s))  Culture, Urine     Status: Abnormal   Collection Time: 07/31/19  6:15 PM   Specimen: Urine, Random  Result Value Ref Range Status   Specimen Description URINE, RANDOM  Final   Special Requests NONE  Final   Culture (A)  Final    <10,000 COLONIES/mL INSIGNIFICANT GROWTH Performed at [facility], [address], [place], [place] [address]    Report Status 08/01/2019 FINAL  Final  SARS Coronavirus 2 by RT PCR (hospital order, performed in [facility] hospital lab) Nasopharyngeal Nasopharyngeal  Swab     Status: None   Collection Time: 07/31/19  9:05 PM   Specimen: Nasopharyngeal Swab  Result Value Ref Range Status   SARS Coronavirus 2 NEGATIVE NEGATIVE Final    Comment: (NOTE) SARS-CoV-2 target nucleic acids are NOT DETECTED. The SARS-CoV-2 RNA is generally detectable in upper and lower respiratory specimens during the acute phase of infection. The lowest concentration of SARS-CoV-2 viral copies this assay can detect is 250 copies / mL. A negative result does not preclude SARS-CoV-2 infection and should not be used as the sole basis for treatment or other patient management decisions.  A negative result may occur with improper specimen collection / handling, submission of specimen other than nasopharyngeal swab, presence of viral mutation(s) within the areas targeted by this assay, and inadequate number of viral copies (<250 copies / mL). A negative result must  be combined with clinical observations, patient history, and epidemiological information. Fact Sheet for Patients:   StrictlyIdeas.no Fact Sheet for Healthcare Providers: BankingDealers.co.za This test is not yet approved or cleared  by the [place] FDA and has been authorized for detection and/or diagnosis of SARS-CoV-2 by FDA under an Emergency Use Authorization (EUA).  This EUA will remain in effect (meaning this test can be used) for the duration of the COVID-19 declaration under Section 564(b)(1) of the Act, 21 U.S.C. section 360bbb-3(b)(1), unless the authorization is terminated or revoked sooner. Performed at [facility], [place] [address], [place], [place] [address]      Medications:   . citalopram  40 mg Oral Daily  . famotidine  20 mg Oral Daily  . furosemide  40 mg Intravenous Once  . heparin  5,000 Units Subcutaneous Q8H  . insulin aspart  0-15 Units Subcutaneous TID WC  . insulin aspart  0-5 Units Subcutaneous QHS  . insulin aspart  25  Units Subcutaneous TID WC  . insulin glargine  50 Units Subcutaneous QHS  . isosorbide-hydrALAZINE  1 tablet Oral TID  . pneumococcal 23 valent vaccine  0.5 mL Intramuscular Tomorrow-1000  . sodium chloride flush  3 mL Intravenous Q12H   Continuous Infusions: . sodium chloride    . cefTRIAXone (ROCEPHIN)  IV 2 g (08/01/19 2150)      LOS: 2 days   Charlynne Cousins  [facility]  08/02/2019, 10:45 AM

## 2019-08-03 LAB — GLUCOSE, CAPILLARY
Glucose-Capillary: 196 mg/dL — ABNORMAL HIGH (ref 70–99)
Glucose-Capillary: 208 mg/dL — ABNORMAL HIGH (ref 70–99)
Glucose-Capillary: 130 mg/dL — ABNORMAL HIGH (ref 70–99)
Glucose-Capillary: 173 mg/dL — ABNORMAL HIGH (ref 70–99)
Glucose-Capillary: 206 mg/dL — ABNORMAL HIGH (ref 70–99)

## 2019-08-03 LAB — BASIC METABOLIC PANEL
Anion gap: 13 (ref 5–15)
BUN: 56 mg/dL — ABNORMAL HIGH (ref 8–23)
CO2: 17 mmol/L — ABNORMAL LOW (ref 22–32)
Calcium: 8.5 mg/dL — ABNORMAL LOW (ref 8.9–10.3)
Chloride: 105 mmol/L (ref 98–111)
Creatinine, Ser: 2.44 mg/dL — ABNORMAL HIGH (ref 0.44–1.00)
GFR calc Af Amer: 22 mL/min — ABNORMAL LOW (ref >60)
GFR calc non Af Amer: 19 mL/min — ABNORMAL LOW (ref >60)
Glucose, Bld: 207 mg/dL — ABNORMAL HIGH (ref 70–99)
Potassium: 5 mmol/L (ref 3.5–5.1)
Sodium: 135 mmol/L (ref 135–145)

## 2019-08-03 MED ORDER — INSULIN ASPART 100 UNIT/ML ~~LOC~~ SOLN
5.0000 [IU] | Freq: Three times a day (TID) | SUBCUTANEOUS | Status: DC
  Administered 2019-08-03 – 2019-08-05 (×6): 5 [IU] via SUBCUTANEOUS

## 2019-08-03 MED ORDER — POLYETHYLENE GLYCOL 3350 17 G PO PACK
17.0000 g | PACK | Freq: Once | ORAL | Status: DC
  Filled 2019-08-03: qty 1

## 2019-08-03 MED ORDER — FUROSEMIDE 10 MG/ML IJ SOLN
40.0000 mg | Freq: Once | INTRAMUSCULAR | Status: AC
  Administered 2019-08-03: 40 mg via INTRAVENOUS
  Filled 2019-08-03: qty 4

## 2019-08-03 MED ORDER — INSULIN GLARGINE 100 UNIT/ML ~~LOC~~ SOLN
30.0000 [IU] | Freq: Two times a day (BID) | SUBCUTANEOUS | Status: DC
  Administered 2019-08-03 – 2019-08-05 (×5): 30 [IU] via SUBCUTANEOUS
  Filled 2019-08-03 (×6): qty 0.3

## 2019-08-03 NOTE — Plan of Care (Signed)
  Problem: Pain Managment: Goal: General experience of comfort will improve Outcome: Completed/Met

## 2019-08-03 NOTE — Progress Notes (Signed)
Progress Note  Patient Name: Kaitlin Berry Date of Encounter: 08/03/2019  Primary Cardiologist: Fransico Him, MD   Subjective   Diuresed 1.3L negative - creatinine stable at 2.44. Remains volume overloaded.  Inpatient Medications    Scheduled Meds: . citalopram  40 mg Oral Daily  . famotidine  20 mg Oral Daily  . heparin  5,000 Units Subcutaneous Q8H  . insulin aspart  0-15 Units Subcutaneous TID WC  . insulin aspart  0-5 Units Subcutaneous QHS  . insulin aspart  25 Units Subcutaneous TID WC  . insulin glargine  30 Units Subcutaneous BID  . isosorbide-hydrALAZINE  1 tablet Oral TID  . pneumococcal 23 valent vaccine  0.5 mL Intramuscular Tomorrow-1000  . polyethylene glycol  17 g Oral Once  . sodium chloride flush  3 mL Intravenous Q12H   Continuous Infusions: . sodium chloride     PRN Meds: sodium chloride, acetaminophen, loperamide, melatonin, ondansetron (ZOFRAN) IV, sodium chloride flush   Vital Signs    Vitals:   08/02/19 1648 08/02/19 1920 08/03/19 0328 08/03/19 0740  BP: (!) 132/55 126/64 (!) 147/61 (!) 161/64  Pulse: 99 94 98 83  Resp: _0 Temp: 98.4 F (36.9 C) 98.1 F (36.7 C) 98.2 F (36.8 C) 97.6 F (36.4 C)  TempSrc: Oral Oral Oral Oral  SpO2: 97% 92% 96% 97%  Weight:   115.4 kg   Height:        Intake/Output Summary (Last 24 hours) at 08/03/2019 0943 Last data filed at 08/03/2019 0700 Gross per 24 hour  Intake 440 ml  Output 1725 ml  Net -1285 ml   Last 3 Weights 08/03/2019 08/02/2019 08/01/2019  Weight (lbs) 254 lb 8 oz 256 lb 4.8 oz 259 lb 14.4 oz  Weight (kg) 115.44 kg 116.257 kg 117.89 kg      Telemetry    SR - Personally Reviewed  ECG    No new - Personally Reviewed  Physical Exam   GEN: No acute distress.   Neck: No JVD Cardiac: RRR, no murmurs, rubs, or gallops.  Respiratory: Clear to diminished to auscultation bilaterally. GI: Soft, nontender, non-distended  MS: No edema; No deformity. Neuro:  Nonfocal  Psych:  Normal affect   Labs    High Sensitivity Troponin:   Recent Labs  Lab 07/31/19 1858 07/31/19 2230  TROPONINIHS 7 8      Chemistry Recent Labs  Lab 08/01/19 0711 08/02/19 0343 08/03/19 0349  NA 138 136 135  K 4.7 4.8 5.0  CL 106 110 105  CO2 17* 16* 17*  GLUCOSE 129* 184* 207*  BUN 38* 51* 56*  CREATININE 2.17* 2.45* 2.44*  CALCIUM 9.3 8.6* 8.5*  GFRNONAA 22* 19* 19*  GFRAA 25* 22* 22*  ANIONGAP _1 Hematology Recent Labs  Lab 07/31/19 1544  WBC 11.2*  RBC 3.71*  HGB 11.0*  HCT 35.8*  MCV 96.5  MCH 29.6  MCHC 30.7  RDW 14.6  PLT 271    BNP Recent Labs  Lab 07/31/19 1859  BNP 109.7*     DDimer  Recent Labs  Lab 07/31/19 1858  DDIMER 1.01*     Radiology    NM Pulmonary Perfusion  Result Date: 08/01/2019 CLINICAL DATA:  Chronic diastolic congestive heart failure EXAM: NUCLEAR MEDICINE PERFUSION LUNG SCAN TECHNIQUE: Perfusion images were obtained in multiple projections after intravenous injection of radiopharmaceutical. Ventilation scans intentionally deferred if perfusion scan and chest x-ray adequate for interpretation during COVID  19 epidemic. RADIOPHARMACEUTICALS:  1.5 mCi Tc-26mMAA COMPARISON:  Radiograph 07/31/2018 FINDINGS: No wedge-shaped peripheral perfusion defects within the LEFT or RIGHT lung to suggest acute pulmonary embolism. IMPRESSION: Normal perfusion scan.  No evidence of acute pulmonary embolism. Electronically Signed   By: SSuzy BouchardM.D.   On: 08/01/2019 15:13   ECHOCARDIOGRAM COMPLETE  Result Date: 08/01/2019    ECHOCARDIOGRAM REPORT   Patient Name:   Kaitlin PARDUEDate of Exam: 08/01/2019 Medical Rec #:  [id number]       Height:       64.0 in Accession #:    [id number]      Weight:       259.9 lb Date of Birth:  74Jul 27, 1947        BSA:          2.187 m Patient Age:    89years         BP:           148/73 mmHg Patient Gender: F                HR:           98 bpm. Exam Location:  Inpatient Procedure: 2D Echo  Indications:    428.31 CHF  History:        Patient has prior history of Echocardiogram examinations, most                 recent 11/29/2016. Risk Factors:Hypertension, Diabetes and                 Dyslipidemia.  Sonographer:    VJannett CelestineRDCS (AE) Referring Phys: [id number] S OPYD  Sonographer Comments: Patient is morbidly obese. Image acquisition challenging due to patient body habitus. IMPRESSIONS  1. Left ventricular ejection fraction, by estimation, is 60 to 65%. The left ventricle has normal function. The left ventricle has no regional wall motion abnormalities. There is moderate left ventricular hypertrophy. Left ventricular diastolic parameters are consistent with Grade I diastolic dysfunction (impaired relaxation).  2. Right ventricular systolic function is normal. The right ventricular size is normal. Tricuspid regurgitation signal is inadequate for assessing PA pressure.  3. The mitral valve is normal in structure. No evidence of mitral valve regurgitation. No evidence of mitral stenosis.  4. The aortic valve is tricuspid. Aortic valve regurgitation is not visualized. Mild aortic valve sclerosis is present, with no evidence of aortic valve stenosis.  5. The inferior vena cava is normal in size with greater than 50% respiratory variability, suggesting right atrial pressure of 3 mmHg.  6. Technically difficult study with poor acoustic windows. FINDINGS  Left Ventricle: Left ventricular ejection fraction, by estimation, is 60 to 65%. The left ventricle has normal function. The left ventricle has no regional wall motion abnormalities. The left ventricular internal cavity size was normal in size. There is  moderate left ventricular hypertrophy. Left ventricular diastolic parameters are consistent with Grade I diastolic dysfunction (impaired relaxation). Right Ventricle: The right ventricular size is normal. No increase in right ventricular wall thickness. Right ventricular systolic function is normal.  Tricuspid regurgitation signal is inadequate for assessing PA pressure. Left Atrium: Left atrial size was normal in size. Right Atrium: Right atrial size was normal in size. Pericardium: There is no evidence of pericardial effusion. Mitral Valve: The mitral valve is normal in structure. Mild mitral annular calcification. No evidence of mitral valve regurgitation. No evidence of mitral valve stenosis. Tricuspid Valve: The tricuspid valve  is normal in structure. Tricuspid valve regurgitation is not demonstrated. Aortic Valve: The aortic valve is tricuspid. Aortic valve regurgitation is not visualized. Mild aortic valve sclerosis is present, with no evidence of aortic valve stenosis. Pulmonic Valve: The pulmonic valve was normal in structure. Pulmonic valve regurgitation is not visualized. Aorta: The aortic root is normal in size and structure. Venous: The inferior vena cava is normal in size with greater than 50% respiratory variability, suggesting right atrial pressure of 3 mmHg. IAS/Shunts: No atrial level shunt detected by color flow Doppler.  LEFT VENTRICLE PLAX 2D LVIDd:         3.30 cm  Diastology LVIDs:         2.10 cm  LV e' lateral:   9.25 cm/s LV PW:         1.30 cm  LV E/e' lateral: 9.0 LV IVS:        1.70 cm  LV e' medial:    6.53 cm/s LVOT diam:     1.80 cm  LV E/e' medial:  12.7 LVOT Area:     2.54 cm  RIGHT VENTRICLE RV S prime:     12.10 cm/s LEFT ATRIUM           Index LA diam:      3.10 cm 1.42 cm/m LA Vol (A2C): 39.5 ml 18.06 ml/m   AORTA Ao Root diam: 2.80 cm MITRAL VALVE MV Area (PHT): 2.96 cm     SHUNTS MV Decel Time: 256 msec     Systemic Diam: 1.80 cm MV E velocity: 82.90 cm/s MV A velocity: 117.00 cm/s MV E/A ratio:  0.71 Loralie Champagne MD Electronically signed by Loralie Champagne MD Signature Date/Time: 08/01/2019/5:59:09 PM    Final     Cardiac Studies   TTE 11/29/2016: mild LVH, EF 60-65%, prolonged relaxation, and MAC without stenosis  Lexiscan SPECT MPI 11/29/2016: Myocardial  perfusion is normal. The study is normal. This is a low risk study. Overall left ventricular systolic function was normal. LV cavity size is normal. Nuclear stress EF: 73%. The left ventricular ejection fraction is hyperdynamic (>65%).  Echo 08/01/19 IMPRESSIONS    1. Left ventricular ejection fraction, by estimation, is 60 to 65%. The  left ventricle has normal function. The left ventricle has no regional  wall motion abnormalities. There is moderate left ventricular hypertrophy.  Left ventricular diastolic  parameters are consistent with Grade I diastolic dysfunction (impaired  relaxation).  2. Right ventricular systolic function is normal. The right ventricular  size is normal. Tricuspid regurgitation signal is inadequate for assessing  PA pressure.  3. The mitral valve is normal in structure. No evidence of mitral valve  regurgitation. No evidence of mitral stenosis.  4. The aortic valve is tricuspid. Aortic valve regurgitation is not  visualized. Mild aortic valve sclerosis is present, with no evidence of  aortic valve stenosis.  5. The inferior vena cava is normal in size with greater than 50%  respiratory variability, suggesting right atrial pressure of 3 mmHg.  6. Technically difficult study with poor acoustic windows.   FINDINGS  Left Ventricle: Left ventricular ejection fraction, by estimation, is 60  to 65%. The left ventricle has normal function. The left ventricle has no  regional wall motion abnormalities. The left ventricular internal cavity  size was normal in size. There is  moderate left ventricular hypertrophy. Left ventricular diastolic  parameters are consistent with Grade I diastolic dysfunction (impaired  relaxation).   Right Ventricle: The right ventricular size is  normal. No increase in  right ventricular wall thickness. Right ventricular systolic function is  normal. Tricuspid regurgitation signal is inadequate for assessing PA    pressure.   Left Atrium: Left atrial size was normal in size.   Right Atrium: Right atrial size was normal in size.   Pericardium: There is no evidence of pericardial effusion.   Mitral Valve: The mitral valve is normal in structure. Mild mitral annular  calcification. No evidence of mitral valve regurgitation. No evidence of  mitral valve stenosis.   Tricuspid Valve: The tricuspid valve is normal in structure. Tricuspid  valve regurgitation is not demonstrated.   Aortic Valve: The aortic valve is tricuspid. Aortic valve regurgitation is  not visualized. Mild aortic valve sclerosis is present, with no evidence  of aortic valve stenosis.   Pulmonic Valve: The pulmonic valve was normal in structure. Pulmonic valve  regurgitation is not visualized.   Aorta: The aortic root is normal in size and structure.   Venous: The inferior vena cava is normal in size with greater than 50%  respiratory variability, suggesting right atrial pressure of 3 mmHg.   IAS/Shunts: No atrial level shunt detected by color flow Doppler.      Patient Profile     74 y.o. female with a history of morbid obesity, chronic back pain, DM, HTN,hyperlipidemia, OSA on CPAP,CKD stage IV, osteoarthritis requiring use of a walker,and HFpEF, with prior work-up for dyspnea notable fornormal LV function with EF 60 to 65% and grade 1 diastolic dysfunction on echo and nuclear stress testwithno inducible ischemia.  Admitted for increased dyspnea, wt gain, and edema.   Assessment & Plan    Acute on chronic heart failure with preserved ejection fraction: She is NYHA class III and is well perfused on exam. No symptoms of angina. Lower suspicion for PE, though given abnormal d-dimer, poor mobility, and borderline tachycardia, a V/Q scan neg for PE.  - Lasix IV BID for goal -2L daily, can start with 22m as she seems to have responded --neg 1857 since late yesterday. And wt down from 119 to 116.3 Kg.continue current  dose - TTE this admit with EF 60-65%, moderate LVH G1DD, RV normal,  - Ensure she has CPAP starting tomorrow night though refused last night  - Antihypertensive management as noted below --troponin neg 7;8  --neg nuc study 2018   Hypertension: initial ED BP normal but has been persistently elevated since then.  - Started Bidil 20/37.537mBID given decompensated heart failure and AKI, can increase to TID if needed- would prob increase to TID --BP 136/48 to 145/57   Obesity/DM: BMI 44, A1c > 12% - Consider addition ofsemaglutideat discharge  UTI per IM   AKI -CKD 4 - good output yesterday with IV lasix, creatinine stable - give additional 40 mg IV today  For questions or updates, please contact [facility] Please consult [email or web address] for contact info under   KePixie CasinoMD, FACC, FARatcliffirector of the [facility] Diplomate of the American Board of Clinical Lipidology Attending Cardiologist  Direct Dial: [phone]: [phone]:  [email or web address]  KePixie CasinoMD  08/03/2019, 9:43 AM

## 2019-08-03 NOTE — Progress Notes (Signed)
[facility] PROGRESS NOTE    Progress Note  [name]  NLZ:[id number] DOB: 11-06-1945 DOA: 07/31/2019 PCP: No primary care provider on file.     Brief Narrative:   Kaitlin Berry is an 74 y.o. female past medical history of OSA on CPAP, chronic heart failure insulin-dependent diabetes mellitus, with a BMI of 45 mm presenting to the ED with increased shortness of breath and bilateral leg swelling.  In the ED he was found to be with low sats chest x-ray was negative for any acute pulmonary disease, creatinine up to 1.8, BNP of 110 started on IV Lasix cardiology has been consulted by the ED.  Assessment/Plan:   Acute on chronic diastolic CHF (congestive heart failure) ([place]): We will start her on IV Lasix cardiology was consulted. Continue strict I's and O's and daily weights monitor renal function. He still appears fluid overloaded on physical exam. 2D echo was done that showed an EF of 55% no wall motion abnormality, grade 1 diastolic heart failure. Further diuresis per cardiology.  Acute kidney injury on chronic kidney disease stage IV slight metabolic acidosis: With serum creatinine 1.8 on admission up from a year ago was 1.4. Creatinine continues to rise it is 2.4. Consult physical therapy.  Possible UTI: At home she was on Bactrim started on 07/26/2018 when she has completed a 5-day course of antibiotic discontinue all antibiotics.  Obstructive as sleep apnea:  Excellent continue CPAP.  Insulin-dependent diabetes mellitus type 2: With an A1c of 11, blood glucose remains high, increase long-acting insulin continue sliding scale.  Elevated D-dimer: D-dimer 1.0, VQ scan is negative.   DVT prophylaxis: lovenox Family Communication:none Status is: Inpatient  Remains inpatient appropriate because:Hemodynamically unstable   Dispo: The patient is from: Home              Anticipated d/c is to: Home              Anticipated d/c date is: 1 days  Patient currently is not medically stable to d/c.         Code Status:     Code Status Orders  (From admission, onward)         Start     Ordered   07/31/19 2145  Full code  Continuous     07/31/19 2144        Code Status History    This patient has a current code status but no historical code status.   Advance Care Planning Activity        IV Access:    Peripheral IV   Procedures and diagnostic studies:   NM Pulmonary Perfusion  Result Date: 08/01/2019 CLINICAL DATA:  Chronic diastolic congestive heart failure EXAM: NUCLEAR MEDICINE PERFUSION LUNG SCAN TECHNIQUE: Perfusion images were obtained in multiple projections after intravenous injection of radiopharmaceutical. Ventilation scans intentionally deferred if perfusion scan and chest x-ray adequate for interpretation during COVID 19 epidemic. RADIOPHARMACEUTICALS:  1.5 mCi Tc-63m MAA COMPARISON:  Radiograph 07/31/2018 FINDINGS: No wedge-shaped peripheral perfusion defects within the LEFT or RIGHT lung to suggest acute pulmonary embolism. IMPRESSION: Normal perfusion scan.  No evidence of acute pulmonary embolism. Electronically Signed   By: Suzy Bouchard M.D.   On: 08/01/2019 15:13   ECHOCARDIOGRAM COMPLETE  Result Date: 08/01/2019    ECHOCARDIOGRAM REPORT   Patient Name:   Kaitlin Berry Date of Exam: 08/01/2019 Medical Rec #:  [id number]        Height:       64.0 in  Accession #:    [id number]       Weight:       259.9 lb Date of Birth:  [birth year]         BSA:          2.187 m Patient Age:    53 years         BP:           148/73 mmHg Patient Gender: F                HR:           98 bpm. Exam Location:  Inpatient Procedure: 2D Echo Indications:    428.31 CHF  History:        Patient has prior history of Echocardiogram examinations, most                 recent 11/29/2016. Risk Factors:Hypertension, Diabetes and                 Dyslipidemia.  Sonographer:    Jannett Celestine RDCS (AE) Referring Phys: [id number] TIMOTHY S OPYD   Sonographer Comments: Patient is morbidly obese. Image acquisition challenging due to patient body habitus. IMPRESSIONS  1. Left ventricular ejection fraction, by estimation, is 60 to 65%. The left ventricle has normal function. The left ventricle has no regional wall motion abnormalities. There is moderate left ventricular hypertrophy. Left ventricular diastolic parameters are consistent with Grade I diastolic dysfunction (impaired relaxation).  2. Right ventricular systolic function is normal. The right ventricular size is normal. Tricuspid regurgitation signal is inadequate for assessing PA pressure.  3. The mitral valve is normal in structure. No evidence of mitral valve regurgitation. No evidence of mitral stenosis.  4. The aortic valve is tricuspid. Aortic valve regurgitation is not visualized. Mild aortic valve sclerosis is present, with no evidence of aortic valve stenosis.  5. The inferior vena cava is normal in size with greater than 50% respiratory variability, suggesting right atrial pressure of 3 mmHg.  6. Technically difficult study with poor acoustic windows. FINDINGS  Left Ventricle: Left ventricular ejection fraction, by estimation, is 60 to 65%. The left ventricle has normal function. The left ventricle has no regional wall motion abnormalities. The left ventricular internal cavity size was normal in size. There is  moderate left ventricular hypertrophy. Left ventricular diastolic parameters are consistent with Grade I diastolic dysfunction (impaired relaxation). Right Ventricle: The right ventricular size is normal. No increase in right ventricular wall thickness. Right ventricular systolic function is normal. Tricuspid regurgitation signal is inadequate for assessing PA pressure. Left Atrium: Left atrial size was normal in size. Right Atrium: Right atrial size was normal in size. Pericardium: There is no evidence of pericardial effusion. Mitral Valve: The mitral valve is normal in structure.  Mild mitral annular calcification. No evidence of mitral valve regurgitation. No evidence of mitral valve stenosis. Tricuspid Valve: The tricuspid valve is normal in structure. Tricuspid valve regurgitation is not demonstrated. Aortic Valve: The aortic valve is tricuspid. Aortic valve regurgitation is not visualized. Mild aortic valve sclerosis is present, with no evidence of aortic valve stenosis. Pulmonic Valve: The pulmonic valve was normal in structure. Pulmonic valve regurgitation is not visualized. Aorta: The aortic root is normal in size and structure. Venous: The inferior vena cava is normal in size with greater than 50% respiratory variability, suggesting right atrial pressure of 3 mmHg. IAS/Shunts: No atrial level shunt detected by color flow Doppler.  LEFT VENTRICLE PLAX 2D LVIDd:  3.30 cm  Diastology LVIDs:         2.10 cm  LV e' lateral:   9.25 cm/s LV PW:         1.30 cm  LV E/e' lateral: 9.0 LV IVS:        1.70 cm  LV e' medial:    6.53 cm/s LVOT diam:     1.80 cm  LV E/e' medial:  12.7 LVOT Area:     2.54 cm  RIGHT VENTRICLE RV S prime:     12.10 cm/s LEFT ATRIUM           Index LA diam:      3.10 cm 1.42 cm/m LA Vol (A2C): 39.5 ml 18.06 ml/m   AORTA Ao Root diam: 2.80 cm MITRAL VALVE MV Area (PHT): 2.96 cm     SHUNTS MV Decel Time: 256 msec     Systemic Diam: 1.80 cm MV E velocity: 82.90 cm/s MV A velocity: 117.00 cm/s MV E/A ratio:  0.71 Loralie Champagne MD Electronically signed by Loralie Champagne MD Signature Date/Time: 08/01/2019/5:59:09 PM    Final      Medical Consultants:    None.  Anti-Infectives:   Rocephin  Subjective:    [name] no new complaints this morning she is able to lay flat to sleep.  Objective:    Vitals:   08/02/19 1648 08/02/19 1920 08/03/19 0328 08/03/19 0740  BP: (!) 132/55 126/64 (!) 147/61 (!) 161/64  Pulse: 99 94 98 83  Resp: 19 19 19    Temp: 98.4 F (36.9 C) 98.1 F (36.7 C) 98.2 F (36.8 C) 97.6 F (36.4 C)  TempSrc: Oral Oral  Oral Oral  SpO2: 97% 92% 96% 97%  Weight:   115.4 kg   Height:       SpO2: 97 %   Intake/Output Summary (Last 24 hours) at 08/03/2019 0920 Last data filed at 08/03/2019 0700 Gross per 24 hour  Intake 440 ml  Output 1725 ml  Net -1285 ml   Filed Weights   08/01/19 0230 08/02/19 0208 08/03/19 0328  Weight: 117.9 kg 116.3 kg 115.4 kg    Exam: General exam: In no acute distress. Respiratory system: Good air movement and clear to auscultation. Cardiovascular system: S1 & S2 heard, RRR. No JVD. Gastrointestinal system: Abdomen is nondistended, soft and nontender.  Extremities: No pedal edema. Skin: No rashes, lesions or ulcers Psychiatry: Judgement and insight appear normal.  Data Reviewed:    Labs: Basic Metabolic Panel: Recent Labs  Lab 07/31/19 1544 07/31/19 1544 07/31/19 1858 08/01/19 0711 08/01/19 0711 08/02/19 0343 08/03/19 0349  NA 133*  --   --  138  --  136 135  K 4.9   < >  --  4.7   < > 4.8 5.0  CL 108  --   --  106  --  110 105  CO2 14*  --   --  17*  --  16* 17*  GLUCOSE 231*  --   --  129*  --  184* 207*  BUN 37*  --   --  38*  --  51* 56*  CREATININE 1.88*  --   --  2.17*  --  2.45* 2.44*  CALCIUM 8.9  --   --  9.3  --  8.6* 8.5*  MG  --   --  2.2  --   --   --   --    < > = values in this interval not displayed.  GFR Estimated Creatinine Clearance: 25.6 mL/min (A) (by C-G formula based on SCr of 2.44 mg/dL (H)). Liver Function Tests: No results for input(s): AST, ALT, ALKPHOS, BILITOT, PROT, ALBUMIN in the last 168 hours. No results for input(s): LIPASE, AMYLASE in the last 168 hours. No results for input(s): AMMONIA in the last 168 hours. Coagulation profile No results for input(s): INR, PROTIME in the last 168 hours. COVID-19 Labs  Recent Labs    07/31/19 1858  DDIMER 1.01*    Lab Results  Component Value Date   SARSCOV2NAA NEGATIVE 07/31/2019    CBC: Recent Labs  Lab 07/31/19 1544  WBC 11.2*  HGB 11.0*  HCT 35.8*  MCV 96.5    PLT 271   Cardiac Enzymes: No results for input(s): CKTOTAL, CKMB, CKMBINDEX, TROPONINI in the last 168 hours. BNP (last 3 results) No results for input(s): PROBNP in the last 8760 hours. CBG: Recent Labs  Lab 08/02/19 1124 08/02/19 1635 08/02/19 2145 08/03/19 0541 08/03/19 0624  GLUCAP 200* 104* 147* 196* 208*   D-Dimer: Recent Labs    07/31/19 1858  DDIMER 1.01*   Hgb A1c: Recent Labs    08/01/19 0711  HGBA1C 11.2*   Lipid Profile: No results for input(s): CHOL, HDL, LDLCALC, TRIG, CHOLHDL, LDLDIRECT in the last 72 hours. Thyroid function studies: No results for input(s): TSH, T4TOTAL, T3FREE, THYROIDAB in the last 72 hours.  Invalid input(s): FREET3 Anemia work up: No results for input(s): VITAMINB12, FOLATE, FERRITIN, TIBC, IRON, RETICCTPCT in the last 72 hours. Sepsis Labs: Recent Labs  Lab 07/31/19 1544  WBC 11.2*   Microbiology Recent Results (from the past 240 hour(s))  Culture, Urine     Status: Abnormal   Collection Time: 07/31/19  6:15 PM   Specimen: Urine, Random  Result Value Ref Range Status   Specimen Description URINE, RANDOM  Final   Special Requests NONE  Final   Culture (A)  Final    <10,000 COLONIES/mL INSIGNIFICANT GROWTH Performed at [facility], [address] [place], [place] [address]    Report Status 08/01/2019 FINAL  Final  SARS Coronavirus 2 by RT PCR (hospital order, performed in [facility] hospital lab) Nasopharyngeal Nasopharyngeal Swab     Status: None   Collection Time: 07/31/19  9:05 PM   Specimen: Nasopharyngeal Swab  Result Value Ref Range Status   SARS Coronavirus 2 NEGATIVE NEGATIVE Final    Comment: (NOTE) SARS-CoV-2 target nucleic acids are NOT DETECTED. The SARS-CoV-2 RNA is generally detectable in upper and lower respiratory specimens during the acute phase of infection. The lowest concentration of SARS-CoV-2 viral copies this assay can detect is 250 copies / mL. A negative result does not preclude  SARS-CoV-2 infection and should not be used as the sole basis for treatment or other patient management decisions.  A negative result may occur with improper specimen collection / handling, submission of specimen other than nasopharyngeal swab, presence of viral mutation(s) within the areas targeted by this assay, and inadequate number of viral copies (<250 copies / mL). A negative result must be combined with clinical observations, patient history, and epidemiological information. Fact Sheet for Patients:   StrictlyIdeas.no Fact Sheet for Healthcare Providers: BankingDealers.co.za This test is not yet approved or cleared  by the [place] FDA and has been authorized for detection and/or diagnosis of SARS-CoV-2 by FDA under an Emergency Use Authorization (EUA).  This EUA will remain in effect (meaning this test can be used) for the duration of the COVID-19 declaration  under Section 564(b)(1) of the Act, 21 U.S.C. section 360bbb-3(b)(1), unless the authorization is terminated or revoked sooner. Performed at [facility], [place] [address], [place], [place] [address]      Medications:   . citalopram  40 mg Oral Daily  . famotidine  20 mg Oral Daily  . heparin  5,000 Units Subcutaneous Q8H  . insulin aspart  0-15 Units Subcutaneous TID WC  . insulin aspart  0-5 Units Subcutaneous QHS  . insulin aspart  25 Units Subcutaneous TID WC  . insulin glargine  50 Units Subcutaneous QHS  . isosorbide-hydrALAZINE  1 tablet Oral TID  . pneumococcal 23 valent vaccine  0.5 mL Intramuscular Tomorrow-1000  . sodium chloride flush  3 mL Intravenous Q12H   Continuous Infusions: . sodium chloride        LOS: 3 days   Charlynne Cousins  [facility]  08/03/2019, 9:20 AM

## 2019-08-03 NOTE — Evaluation (Addendum)
Physical Therapy Evaluation Patient Details Name: Kaitlin Berry MRN: [id number] DOB: 10/12/1945 Today's Date: 08/03/2019   History of Present Illness  Kaitlin Berry is an 74 y.o. female past medical history of OSA on CPAP, chronic heart failure insulin-dependent diabetes mellitus, with a BMI of 45 mm presenting to the ED with increased shortness of breath and bilateral leg swelling.   Clinical Impression  Pt admitted with/for fluid overload/SOB.  Pt now mobilizing at S to mod I level and likely progressing well toward her baseline.  Pt currently limited functionally due to the problems listed below.  (see problems list.)  Pt will benefit from PT to maximize function and safety to be able to get home safely with available assist.     Follow Up Recommendations Home health PT    Equipment Recommendations  None recommended by PT    Recommendations for Other Services       Precautions / Restrictions Precautions Precautions: (minimal fall risk) Restrictions Weight Bearing Restrictions: No      Mobility  Bed Mobility Overal bed mobility: Modified Independent                Transfers Overall transfer level: Modified independent                  Ambulation/Gait Ambulation/Gait assistance: Supervision Gait Distance (Feet): 100 Feet(and multiple 10-15 foot trials to adjust the rollator) Assistive device: 4-wheeled walker Gait Pattern/deviations: Step-through pattern Gait velocity: slower Gait velocity interpretation: <1.8 ft/sec, indicate of risk for recurrent falls General Gait Details: generally steady.  Stays a little close to the rollator, but lowering the handles help relax her arms .  Stairs            Wheelchair Mobility    Modified Rankin (Stroke Patients Only)       Balance Overall balance assessment: No apparent balance deficits (not formally assessed)                                           Pertinent Vitals/Pain  Pain Assessment: Faces Faces Pain Scale: Hurts little more Pain Location: general, back, knees Pain Descriptors / Indicators: Aching;Discomfort Pain Intervention(s): Monitored during session    Home Living Family/patient expects to be discharged to:: Private residence Living Arrangements: Spouse/significant other Available Help at Discharge: Family;Available 24 hours/day Type of Home: House Home Access: Stairs to enter     Home Layout: Two level;Bed/bath upstairs;Other (Comment)(has stair lift) Home Equipment: Walker - 4 wheels;Shower seat      Prior Function Level of Independence: Independent with assistive device(s)         Comments: husband has taken over a lot of the household chores.  Pt can do her ADL's     Hand Dominance        Extremity/Trunk Assessment   Upper Extremity Assessment Upper Extremity Assessment: Overall WFL for tasks assessed    Lower Extremity Assessment Lower Extremity Assessment: Overall WFL for tasks assessed(generally weak from inactivity and arthritic pain)       Communication   Communication: No difficulties  Cognition Arousal/Alertness: Awake/alert Behavior During Therapy: WFL for tasks assessed/performed Overall Cognitive Status: Within Functional Limits for tasks assessed  General Comments General comments (skin integrity, edema, etc.): Pt's response to mobility was benign without dyspnea, sats in the 90's    Exercises     Assessment/Plan    PT Assessment Patent does not need any further PT services  PT Problem List         PT Treatment Interventions      PT Goals (Current goals can be found in the Care Plan section)  Acute Rehab PT Goals Patient Stated Goal: breathe better PT Goal Formulation: All assessment and education complete, DC therapy    Frequency     Barriers to discharge        Co-evaluation               AM-PAC PT "6 Clicks" Mobility   Outcome Measure Help needed turning from your back to your side while in a flat bed without using bedrails?: None Help needed moving from lying on your back to sitting on the side of a flat bed without using bedrails?: None Help needed moving to and from a bed to a chair (including a wheelchair)?: None Help needed standing up from a chair using your arms (e.g., wheelchair or bedside chair)?: None Help needed to walk in hospital room?: None Help needed climbing 3-5 steps with a railing? : A Lot 6 Click Score: 22    End of Session   Activity Tolerance: Patient tolerated treatment well Patient left: in bed;with chair alarm set;Other (comment)(sitting EOB) Nurse Communication: Mobility status PT Visit Diagnosis: Other abnormalities of gait and mobility (R26.89);Pain;Difficulty in walking, not elsewhere classified (R26.2) Pain - part of body: (back knees)    Time: 8828-0034 PT Time Calculation (min) (ACUTE ONLY): 28 min   Charges:   PT Evaluation $PT Eval Moderate Complexity: 1 Mod PT Treatments $Gait Training: 8-22 mins        08/03/2019  Ginger Carne., PT Acute Rehabilitation Services [phone]  (pager) [phone]  (office)  Tessie Fass Tamon Parkerson 08/03/2019, 11:25 AM

## 2019-08-03 NOTE — Care Management Important Message (Signed)
Important Message  Patient Details  Name: TRAVIA ONSTAD MRN: [id number] Date of Birth: [birth year]   Medicare Important Message Given:  Yes     Shelda Altes 08/03/2019, 12:11 PM

## 2019-08-04 ENCOUNTER — Telehealth: Payer: Self-pay | Admitting: *Deleted

## 2019-08-04 LAB — BASIC METABOLIC PANEL
Anion gap: 15 (ref 5–15)
BUN: 58 mg/dL — ABNORMAL HIGH (ref 8–23)
CO2: 14 mmol/L — ABNORMAL LOW (ref 22–32)
Calcium: 8.7 mg/dL — ABNORMAL LOW (ref 8.9–10.3)
Chloride: 106 mmol/L (ref 98–111)
Creatinine, Ser: 2.33 mg/dL — ABNORMAL HIGH (ref 0.44–1.00)
GFR calc Af Amer: 23 mL/min — ABNORMAL LOW (ref >60)
GFR calc non Af Amer: 20 mL/min — ABNORMAL LOW (ref >60)
Glucose, Bld: 152 mg/dL — ABNORMAL HIGH (ref 70–99)
Potassium: 4.7 mmol/L (ref 3.5–5.1)
Sodium: 135 mmol/L (ref 135–145)

## 2019-08-04 LAB — GLUCOSE, CAPILLARY
Glucose-Capillary: 158 mg/dL — ABNORMAL HIGH (ref 70–99)
Glucose-Capillary: 182 mg/dL — ABNORMAL HIGH (ref 70–99)
Glucose-Capillary: 189 mg/dL — ABNORMAL HIGH (ref 70–99)
Glucose-Capillary: 189 mg/dL — ABNORMAL HIGH (ref 70–99)

## 2019-08-04 MED ORDER — FUROSEMIDE 10 MG/ML IJ SOLN
60.0000 mg | Freq: Once | INTRAMUSCULAR | Status: AC
  Administered 2019-08-04: 60 mg via INTRAVENOUS
  Filled 2019-08-04: qty 6

## 2019-08-04 MED ORDER — SALINE SPRAY 0.65 % NA SOLN
1.0000 | NASAL | Status: DC | PRN
  Administered 2019-08-04: 1 via NASAL
  Filled 2019-08-04: qty 44

## 2019-08-04 NOTE — Progress Notes (Addendum)
Progress Note  Patient Name: Kaitlin Berry Date of Encounter: 08/04/2019  Primary Cardiologist: Fransico Him, MD   Subjective   Dyspnea improved. She is not sure if she is back to baseline or not. Her weight is back to baseline.  Inpatient Medications    Scheduled Meds: . citalopram  40 mg Oral Daily  . famotidine  20 mg Oral Daily  . heparin  5,000 Units Subcutaneous Q8H  . insulin aspart  0-15 Units Subcutaneous TID WC  . insulin aspart  0-5 Units Subcutaneous QHS  . insulin aspart  5 Units Subcutaneous TID WC  . insulin glargine  30 Units Subcutaneous BID  . isosorbide-hydrALAZINE  1 tablet Oral TID  . pneumococcal 23 valent vaccine  0.5 mL Intramuscular Tomorrow-1000  . polyethylene glycol  17 g Oral Once  . sodium chloride flush  3 mL Intravenous Q12H   Continuous Infusions: . sodium chloride     PRN Meds: sodium chloride, acetaminophen, loperamide, melatonin, ondansetron (ZOFRAN) IV, sodium chloride, sodium chloride flush   Vital Signs    Vitals:   08/03/19 0740 08/03/19 1409 08/03/19 2058 08/04/19 0350  BP: (!) 161/64 (!) 148/60 (!) 141/60 (!) 150/70  Pulse: 83 97 86 97  Resp:  18 19 19   Temp: 97.6 F (36.4 C) 98.1 F (36.7 C) 97.6 F (36.4 C) 98.1 F (36.7 C)  TempSrc: Oral Oral Oral Oral  SpO2: 97% 97% 97% 94%  Weight:    115 kg  Height:        Intake/Output Summary (Last 24 hours) at 08/04/2019 0939 Last data filed at 08/04/2019 0350 Gross per 24 hour  Intake 480 ml  Output 2600 ml  Net -2120 ml   Filed Weights   08/02/19 0208 08/03/19 0328 08/04/19 0350  Weight: 116.3 kg 115.4 kg 115 kg    Telemetry    NSR - Personally Reviewed  ECG    none - Personally Reviewed  Physical Exam   GEN: No acute distress.   Neck: 6 cm JVD Cardiac: RRR, no murmurs, rubs, or gallops.  Respiratory: Clear to auscultation bilaterally except for rare basilar rales GI: Soft, nontender, non-distended  MS: No edema; No deformity. Neuro:  Nonfocal  Psych:  Normal affect   Labs    Chemistry Recent Labs  Lab 08/02/19 0343 08/03/19 0349 08/04/19 0552  NA 136 135 135  K 4.8 5.0 4.7  CL 110 105 106  CO2 16* 17* 14*  GLUCOSE 184* 207* 152*  BUN 51* 56* 58*  CREATININE 2.45* 2.44* 2.33*  CALCIUM 8.6* 8.5* 8.7*  GFRNONAA 19* 19* 20*  GFRAA 22* 22* 23*  ANIONGAP 10 13 15      Hematology Recent Labs  Lab 07/31/19 1544  WBC 11.2*  RBC 3.71*  HGB 11.0*  HCT 35.8*  MCV 96.5  MCH 29.6  MCHC 30.7  RDW 14.6  PLT 271    Cardiac EnzymesNo results for input(s): TROPONINI in the last 168 hours. No results for input(s): TROPIPOC in the last 168 hours.   BNP Recent Labs  Lab 07/31/19 1859  BNP 109.7*     DDimer  Recent Labs  Lab 07/31/19 1858  DDIMER 1.01*     Radiology    No results found.  Cardiac Studies   none  Patient Profile     74 y.o. female admitted with acute on chronic volume overload.   Assessment & Plan    1. Acute on chronic diatolic CHF - her weight is stable. Her creatinine  a little better. We will continue IV lasix. Expect DC home tomorrow if stable.  2. HTN - her bp is still a little high. Might improve with more lasix. 3. Stage 4 CKD - her creatinine a little better. We will follow.  Kaitlin Berry,M.D.     For questions or updates, please contact [place] Please consult [email or web address] for contact info under Cardiology/STEMI.      Signed, Kaitlin Peru, MD  08/04/2019, 9:39 AM  Patient ID: Kaitlin Berry, female   DOB: [birth year], 74 y.o.   MRN: [id number]

## 2019-08-04 NOTE — Progress Notes (Signed)
Patient has home CPAP unit for use and will self place when ready.

## 2019-08-04 NOTE — Plan of Care (Signed)
  Problem: Education: Goal: Knowledge of General Education information will improve Description: Including pain rating scale, medication(s)/side effects and non-pharmacologic comfort measures Outcome: Progressing   Problem: Health Behavior/Discharge Planning: Goal: Ability to manage health-related needs will improve Outcome: Progressing   Problem: Clinical Measurements: Goal: Ability to maintain clinical measurements within normal limits will improve Outcome: Progressing Goal: Will remain free from infection Outcome: Progressing Goal: Diagnostic test results will improve Outcome: Progressing Goal: Respiratory complications will improve Outcome: Progressing Goal: Cardiovascular complication will be avoided Outcome: Progressing   Problem: Activity: Goal: Risk for activity intolerance will decrease Outcome: Progressing   Problem: Nutrition: Goal: Adequate nutrition will be maintained Outcome: Progressing   Problem: Coping: Goal: Level of anxiety will decrease Outcome: Progressing   Problem: Elimination: Goal: Will not experience complications related to bowel motility Outcome: Progressing Goal: Will not experience complications related to urinary retention Outcome: Progressing   Problem: Safety: Goal: Ability to remain free from injury will improve Outcome: Progressing   Problem: Skin Integrity: Goal: Risk for impaired skin integrity will decrease Outcome: Progressing   Problem: Education: Goal: Ability to demonstrate management of disease process will improve Outcome: Progressing Goal: Ability to verbalize understanding of medication therapies will improve Outcome: Progressing Goal: Individualized Educational Video(s) Outcome: Progressing   Problem: Activity: Goal: Capacity to carry out activities will improve Outcome: Progressing   Problem: Cardiac: Goal: Ability to achieve and maintain adequate cardiopulmonary perfusion will improve Outcome: Progressing

## 2019-08-04 NOTE — Telephone Encounter (Signed)
Informed patient of compliance results and verbalized understanding was indicated. Patient is aware and agreeable to AHI being within range at 0.3. Patient is aware and agreeable to being in compliance with machine usage. Patient is aware and agreeable to no change in current pressures. 

## 2019-08-04 NOTE — Progress Notes (Signed)
[facility] PROGRESS NOTE    Progress Note  Kaitlin Berry  JOA:[id number] DOB: 11/21/1945 DOA: 07/31/2019 PCP: No primary care provider on file.     Brief Narrative:   Kaitlin Berry is an 74 y.o. female past medical history of OSA on CPAP, chronic heart failure insulin-dependent diabetes mellitus, with a BMI of 45 mm presenting to the ED with increased shortness of breath and bilateral leg swelling.  In the ED he was found to be with low sats chest x-ray was negative for any acute pulmonary disease, creatinine up to 1.8, BNP of 110 started on IV Lasix cardiology has been consulted by the ED.  Assessment/Plan:   Acute on chronic diastolic CHF (congestive heart failure) ([place]): Continue IV Lasix per cardiology.  He is to have good diuresis. Continue strict I's and O's and daily weights monitor renal function. He still appears fluid overloaded on physical exam. 2D echo was done that showed an EF of 55% no wall motion abnormality, grade 1 diastolic heart failure. Further diuresis per cardiology.  Acute kidney injury on chronic kidney disease stage IV slight metabolic acidosis: Her creatinine has remained stable at 2.4 responding to diuresis.  Possible UTI: At home she was on Bactrim started on 07/26/2018 when she has completed a 5-day course of antibiotic discontinue all antibiotics.  Obstructive as sleep apnea:  Excellent continue CPAP.  Insulin-dependent diabetes mellitus type 2: With an A1c of 11, blood glucose remains high, increase long-acting insulin continue sliding scale.  Elevated D-dimer: D-dimer 1.0, VQ scan is negative.   DVT prophylaxis: lovenox Family Communication:none Status is: Inpatient  Remains inpatient appropriate because:Hemodynamically unstable   Dispo: The patient is from: Home              Anticipated d/c is to: Home              Anticipated d/c date is: 1 days              Patient currently is not medically stable to d/c.  Code Status:       Code Status Orders  (From admission, onward)         Start     Ordered   07/31/19 2145  Full code  Continuous     07/31/19 2144        Code Status History    This patient has a current code status but no historical code status.   Advance Care Planning Activity        IV Access:    Peripheral IV   Procedures and diagnostic studies:   No results found.   Medical Consultants:    None.  Anti-Infectives:   Rocephin  Subjective:    [name] no new complaints this morning she is able to lay flat to sleep.  Objective:    Vitals:   08/03/19 0740 08/03/19 1409 08/03/19 2058 08/04/19 0350  BP: (!) 161/64 (!) 148/60 (!) 141/60 (!) 150/70  Pulse: 83 97 86 97  Resp:  18 19 19   Temp: 97.6 F (36.4 C) 98.1 F (36.7 C) 97.6 F (36.4 C) 98.1 F (36.7 C)  TempSrc: Oral Oral Oral Oral  SpO2: 97% 97% 97% 94%  Weight:    115 kg  Height:       SpO2: 94 %   Intake/Output Summary (Last 24 hours) at 08/04/2019 0805 Last data filed at 08/04/2019 0350 Gross per 24 hour  Intake 480 ml  Output 2600 ml  Net -2120  ml   Filed Weights   08/02/19 0208 08/03/19 0328 08/04/19 0350  Weight: 116.3 kg 115.4 kg 115 kg    Exam: General exam: In no acute distress. Respiratory system: Good air movement and clear to auscultation. Cardiovascular system: S1 & S2 heard, RRR. No JVD. Gastrointestinal system: Abdomen is nondistended, soft and nontender.  Extremities: No pedal edema. Skin: No rashes, lesions or ulcers Psychiatry: Judgement and insight appear normal.  Data Reviewed:    Labs: Basic Metabolic Panel: Recent Labs  Lab 07/31/19 1544 07/31/19 1544 07/31/19 1858 08/01/19 0711 08/01/19 0711 08/02/19 0343 08/02/19 0343 08/03/19 0349 08/04/19 0552  NA 133*  --   --  138  --  136  --  135 135  K 4.9   < >  --  4.7   < > 4.8   < > 5.0 4.7  CL 108  --   --  106  --  110  --  105 106  CO2 14*  --   --  17*  --  16*  --  17* 14*  GLUCOSE 231*  --    --  129*  --  184*  --  207* 152*  BUN 37*  --   --  38*  --  51*  --  56* 58*  CREATININE 1.88*  --   --  2.17*  --  2.45*  --  2.44* 2.33*  CALCIUM 8.9  --   --  9.3  --  8.6*  --  8.5* 8.7*  MG  --   --  2.2  --   --   --   --   --   --    < > = values in this interval not displayed.   GFR Estimated Creatinine Clearance: 26.8 mL/min (A) (by C-G formula based on SCr of 2.33 mg/dL (H)). Liver Function Tests: No results for input(s): AST, ALT, ALKPHOS, BILITOT, PROT, ALBUMIN in the last 168 hours. No results for input(s): LIPASE, AMYLASE in the last 168 hours. No results for input(s): AMMONIA in the last 168 hours. Coagulation profile No results for input(s): INR, PROTIME in the last 168 hours. COVID-19 Labs  No results for input(s): DDIMER, FERRITIN, LDH, CRP in the last 72 hours.  Lab Results  Component Value Date   [place] NEGATIVE 07/31/2019    CBC: Recent Labs  Lab 07/31/19 1544  WBC 11.2*  HGB 11.0*  HCT 35.8*  MCV 96.5  PLT 271   Cardiac Enzymes: No results for input(s): CKTOTAL, CKMB, CKMBINDEX, TROPONINI in the last 168 hours. BNP (last 3 results) No results for input(s): PROBNP in the last 8760 hours. CBG: Recent Labs  Lab 08/03/19 0624 08/03/19 1114 08/03/19 1554 08/03/19 2139 08/04/19 0603  GLUCAP 208* 130* 173* 206* 158*   D-Dimer: No results for input(s): DDIMER in the last 72 hours. Hgb A1c: No results for input(s): HGBA1C in the last 72 hours. Lipid Profile: No results for input(s): CHOL, HDL, LDLCALC, TRIG, CHOLHDL, LDLDIRECT in the last 72 hours. Thyroid function studies: No results for input(s): TSH, T4TOTAL, T3FREE, THYROIDAB in the last 72 hours.  Invalid input(s): FREET3 Anemia work up: No results for input(s): VITAMINB12, FOLATE, FERRITIN, TIBC, IRON, RETICCTPCT in the last 72 hours. Sepsis Labs: Recent Labs  Lab 07/31/19 1544  WBC 11.2*   Microbiology Recent Results (from the past 240 hour(s))  Culture, Urine     Status:  Abnormal   Collection Time: 07/31/19  6:15 PM   Specimen: Urine,  Random  Result Value Ref Range Status   Specimen Description URINE, RANDOM  Final   Special Requests NONE  Final   Culture (A)  Final    <10,000 COLONIES/mL INSIGNIFICANT GROWTH Performed at [facility], [address], [place], [place] [address]    Report Status 08/01/2019 FINAL  Final  SARS Coronavirus 2 by RT PCR (hospital order, performed in [facility] hospital lab) Nasopharyngeal Nasopharyngeal Swab     Status: None   Collection Time: 07/31/19  9:05 PM   Specimen: Nasopharyngeal Swab  Result Value Ref Range Status   SARS Coronavirus 2 NEGATIVE NEGATIVE Final    Comment: (NOTE) SARS-CoV-2 target nucleic acids are NOT DETECTED. The SARS-CoV-2 RNA is generally detectable in upper and lower respiratory specimens during the acute phase of infection. The lowest concentration of SARS-CoV-2 viral copies this assay can detect is 250 copies / mL. A negative result does not preclude SARS-CoV-2 infection and should not be used as the sole basis for treatment or other patient management decisions.  A negative result may occur with improper specimen collection / handling, submission of specimen other than nasopharyngeal swab, presence of viral mutation(s) within the areas targeted by this assay, and inadequate number of viral copies (<250 copies / mL). A negative result must be combined with clinical observations, patient history, and epidemiological information. Fact Sheet for Patients:   StrictlyIdeas.no Fact Sheet for Healthcare Providers: BankingDealers.co.za This test is not yet approved or cleared  by the [place] FDA and has been authorized for detection and/or diagnosis of SARS-CoV-2 by FDA under an Emergency Use Authorization (EUA).  This EUA will remain in effect (meaning this test can be used) for the duration of the COVID-19 declaration under Section  564(b)(1) of the Act, 21 U.S.C. section 360bbb-3(b)(1), unless the authorization is terminated or revoked sooner. Performed at [facility], [place] [address], [place], [place] [address]      Medications:   . citalopram  40 mg Oral Daily  . famotidine  20 mg Oral Daily  . heparin  5,000 Units Subcutaneous Q8H  . insulin aspart  0-15 Units Subcutaneous TID WC  . insulin aspart  0-5 Units Subcutaneous QHS  . insulin aspart  5 Units Subcutaneous TID WC  . insulin glargine  30 Units Subcutaneous BID  . isosorbide-hydrALAZINE  1 tablet Oral TID  . pneumococcal 23 valent vaccine  0.5 mL Intramuscular Tomorrow-1000  . polyethylene glycol  17 g Oral Once  . sodium chloride flush  3 mL Intravenous Q12H   Continuous Infusions: . sodium chloride        LOS: 4 days   Charlynne Cousins  [facility]  08/04/2019, 8:05 AM

## 2019-08-04 NOTE — Telephone Encounter (Signed)
-----   Message from Sueanne Margarita, MD sent at 08/03/2019  5:40 PM EDT ----- Good AHI and compliance.  Continue current PAP settings.

## 2019-08-05 LAB — BASIC METABOLIC PANEL
Anion gap: 13 (ref 5–15)
BUN: 59 mg/dL — ABNORMAL HIGH (ref 8–23)
CO2: 16 mmol/L — ABNORMAL LOW (ref 22–32)
Calcium: 8.9 mg/dL (ref 8.9–10.3)
Chloride: 106 mmol/L (ref 98–111)
Creatinine, Ser: 2.25 mg/dL — ABNORMAL HIGH (ref 0.44–1.00)
GFR calc Af Amer: 24 mL/min — ABNORMAL LOW (ref >60)
GFR calc non Af Amer: 21 mL/min — ABNORMAL LOW (ref >60)
Glucose, Bld: 148 mg/dL — ABNORMAL HIGH (ref 70–99)
Potassium: 4.4 mmol/L (ref 3.5–5.1)
Sodium: 135 mmol/L (ref 135–145)

## 2019-08-05 LAB — GLUCOSE, CAPILLARY
Glucose-Capillary: 151 mg/dL — ABNORMAL HIGH (ref 70–99)
Glucose-Capillary: 151 mg/dL — ABNORMAL HIGH (ref 70–99)

## 2019-08-05 MED ORDER — FUROSEMIDE 40 MG PO TABS: 60.0000 mg | ORAL_TABLET | Freq: Every day | ORAL | 0 refills | Status: DC

## 2019-08-05 MED ORDER — FIASP FLEXTOUCH 100 UNIT/ML ~~LOC~~ SOPN: 20.0000 [IU] | PEN_INJECTOR | SUBCUTANEOUS | Status: DC

## 2019-08-05 MED ORDER — INSULIN ASPART 100 UNIT/ML FLEXPEN
8.0000 [IU] | PEN_INJECTOR | Freq: Three times a day (TID) | SUBCUTANEOUS | 11 refills | Status: DC
Start: 2019-08-05 — End: 2019-08-05

## 2019-08-05 MED ORDER — ISOSORB DINITRATE-HYDRALAZINE 20-37.5 MG PO TABS: 1.0000 | ORAL_TABLET | Freq: Three times a day (TID) | ORAL | 0 refills | Status: DC

## 2019-08-05 MED ORDER — BLOOD GLUCOSE MONITOR KIT: PACK | 0 refills | Status: AC

## 2019-08-05 MED ORDER — INSULIN ASPART 100 UNIT/ML ~~LOC~~ SOLN: 5.0000 [IU] | Freq: Three times a day (TID) | SUBCUTANEOUS | 11 refills | Status: DC

## 2019-08-05 NOTE — Discharge Summary (Addendum)
Physician Discharge Summary  Kaitlin Berry VQQ:[id number] DOB: 05-07-1945 DOA: 07/31/2019  PCP: No primary care provider on file.  Admit date: 07/31/2019 Discharge date: 08/05/2019  Admitted From: Home Disposition:  Home  Recommendations for Outpatient Follow-up:  1. Follow up with PCP in 1-2 weeks 2. Please obtain BMP/CBC in one week 3. Titrate his insulin as tolerated as an outpatient, she should take her CBG logs to the PCP  Home Health:Yes Equipment/Devices:None  Discharge Condition:Stable CODE STATUS:Full Diet recommendation: Heart Healthy   Brief/Interim Summary:  75 y.o. female past medical history of OSA on CPAP, chronic heart failure insulin-dependent diabetes mellitus, with a BMI of 45 mm presenting to the ED with increased shortness of breath and bilateral leg swelling.  In the ED he was found to be with low sats chest x-ray was negative for any acute pulmonary disease, creatinine up to 1.8, BNP of 110 started on IV Lasix cardiology has been consulted by the ED.  Discharge Diagnoses:  Principal Problem:   Acute on chronic diastolic CHF (congestive heart failure) (HCC) Active Problems:   OBSTRUCTIVE SLEEP APNEA   CKD (chronic kidney disease), stage IV (HCC)   Metabolic acidosis   Acute lower UTI   Insulin-requiring or dependent type II diabetes mellitus (HCC)   Acute on chronic systolic congestive heart failure (HCC)  Acute on chronic diastolic heart failure: Cardiology was consulted she was diuresed and she is negative about she was continued on her other heart failure medications. Showed an EF of 55% with grade 1 diastolic heart failure. She will go home on Lasix 60 mg, follow-up with cardiology in 1 to 2 weeks check a basic metabolic panel, titrate medications as tolerated.  Acute kidney injury on chronic kidney disease stage IV/slight metabolic acidosis: Her creatinine remained stable at 2.4, We will need to get a be made as an outpatient if she continues to be  mildly acidotic will need to be referred to nephrologist as she will probably need to be on bicarbonate tablets for her mild acidosis.  Possible UTI: She completed her course of antibiotics in house.  Obstructive sleep apnea: Continue CPAP at home.  Insulin-dependent diabetes mellitus type 2:  With a hemoglobin A1c of 11, at home she is on liked acting insulin 70 units, but she is not on a short acting with meals this was added, she has been instructed to check CBGs before meals and at bedtime and take the CBG log to her PCP.  Elevated D-dimers: VQ scan negative.    Discharge Instructions  Discharge Instructions    Diet - low sodium heart healthy   Complete by: As directed    Increase activity slowly   Complete by: As directed      Allergies as of 08/05/2019      Reactions   Atorvastatin Other (See Comments)   Ciprofloxacin Other (See Comments), Hives   Unknown   Ciprofloxacin Hcl Hives   Lisinopril Rash      Medication List    STOP taking these medications   sulfamethoxazole-trimethoprim 800-160 MG tablet Commonly known as: BACTRIM DS     TAKE these medications   blood glucose meter kit and supplies Kit Dispense based on patient and insurance preference. Use up to four times daily as directed. (FOR ICD-9 250.00, 250.01).   citalopram 40 MG tablet Commonly known as: CELEXA Take 40 mg by mouth daily.   colchicine 0.6 MG tablet Take 0.6 mg by mouth as needed. Gout   Fiasp FlexTouch 100 UNIT/ML  FlexTouch Pen Generic drug: insulin aspart Inject 20 Units into the skin See admin instructions. 50 units per meal and if reading is 300 or more,  take additional 50 units, as needed. What changed: how much to take   furosemide 40 MG tablet Commonly known as: LASIX Take 1.5 tablets (60 mg total) by mouth daily. What changed: how much to take   HYDROcodone-acetaminophen 5-325 MG tablet Commonly known as: NORCO/VICODIN Take 1 tablet by mouth every 4 (four) hours as  needed.   isosorbide-hydrALAZINE 20-37.5 MG tablet Commonly known as: BIDIL Take 1 tablet by mouth 3 (three) times daily.   ranitidine 150 MG tablet Commonly known as: ZANTAC Take 150 mg by mouth 2 (two) times daily.   Tyler Aas FlexTouch 100 UNIT/ML FlexTouch Pen Generic drug: insulin degludec Inject 70 Units into the skin daily.   vitamin B-12 1000 MCG tablet Commonly known as: CYANOCOBALAMIN Take 1,000 mcg by mouth daily.      Follow-up Information    Sueanne Margarita, MD Follow up.   Specialty: Cardiology Why: The office should contact you within 2-3 business days to arrange Cardiology follow-up. If you do not hear from them within that time frame, please contact the office.  Contact information: [address] [address] [phone]        [facility] Follow up.   Specialty: Home Health Services Why: For home health services. They will call you in 1-2 days to set up your first home health appointment Contact information: [address] [place] [address] [phone]          Allergies  Allergen Reactions  . Atorvastatin Other (See Comments)  . Ciprofloxacin Other (See Comments) and Hives    Unknown  . Ciprofloxacin Hcl Hives  . Lisinopril Rash    Consultations:  Cardiology   Procedures/Studies: DG Chest 2 View  Result Date: 07/31/2019 CLINICAL DATA:  Shortness of breath EXAM: CHEST - 2 VIEW COMPARISON:  11/15/2016 FINDINGS: The heart size and mediastinal contours are within normal limits. No focal airspace consolidation, pleural effusion, or pneumothorax. Mildly exaggerated thoracic kyphosis with changes of DISH or. IMPRESSION: No active cardiopulmonary disease. Electronically Signed   By: Davina Poke D.O.   On: 07/31/2019 16:52   NM Pulmonary Perfusion  Result Date: 08/01/2019 CLINICAL DATA:  Chronic diastolic congestive heart failure EXAM: NUCLEAR MEDICINE PERFUSION LUNG SCAN TECHNIQUE:  Perfusion images were obtained in multiple projections after intravenous injection of radiopharmaceutical. Ventilation scans intentionally deferred if perfusion scan and chest x-ray adequate for interpretation during COVID 19 epidemic. RADIOPHARMACEUTICALS:  1.5 mCi Tc-2mMAA COMPARISON:  Radiograph 07/31/2018 FINDINGS: No wedge-shaped peripheral perfusion defects within the LEFT or RIGHT lung to suggest acute pulmonary embolism. IMPRESSION: Normal perfusion scan.  No evidence of acute pulmonary embolism. Electronically Signed   By: SSuzy BouchardM.D.   On: 08/01/2019 15:13   ECHOCARDIOGRAM COMPLETE  Result Date: 08/01/2019    ECHOCARDIOGRAM REPORT   Patient Name:   Kaitlin DUARTEDate of Exam: 08/01/2019 Medical Rec #:  [id number]       Height:       64.0 in Accession #:    [id number]      Weight:       259.9 lb Date of Birth:  9May 27, 1947        BSA:          2.187 m Patient Age:    89years  BP:           148/73 mmHg Patient Gender: F                HR:           98 bpm. Exam Location:  Inpatient Procedure: 2D Echo Indications:    428.31 CHF  History:        Patient has prior history of Echocardiogram examinations, most                 recent 11/29/2016. Risk Factors:Hypertension, Diabetes and                 Dyslipidemia.  Sonographer:    Jannett Celestine RDCS (AE) Referring Phys: [id number] TIMOTHY S OPYD  Sonographer Comments: Patient is morbidly obese. Image acquisition challenging due to patient body habitus. IMPRESSIONS  1. Left ventricular ejection fraction, by estimation, is 60 to 65%. The left ventricle has normal function. The left ventricle has no regional wall motion abnormalities. There is moderate left ventricular hypertrophy. Left ventricular diastolic parameters are consistent with Grade I diastolic dysfunction (impaired relaxation).  2. Right ventricular systolic function is normal. The right ventricular size is normal. Tricuspid regurgitation signal is inadequate for assessing PA  pressure.  3. The mitral valve is normal in structure. No evidence of mitral valve regurgitation. No evidence of mitral stenosis.  4. The aortic valve is tricuspid. Aortic valve regurgitation is not visualized. Mild aortic valve sclerosis is present, with no evidence of aortic valve stenosis.  5. The inferior vena cava is normal in size with greater than 50% respiratory variability, suggesting right atrial pressure of 3 mmHg.  6. Technically difficult study with poor acoustic windows. FINDINGS  Left Ventricle: Left ventricular ejection fraction, by estimation, is 60 to 65%. The left ventricle has normal function. The left ventricle has no regional wall motion abnormalities. The left ventricular internal cavity size was normal in size. There is  moderate left ventricular hypertrophy. Left ventricular diastolic parameters are consistent with Grade I diastolic dysfunction (impaired relaxation). Right Ventricle: The right ventricular size is normal. No increase in right ventricular wall thickness. Right ventricular systolic function is normal. Tricuspid regurgitation signal is inadequate for assessing PA pressure. Left Atrium: Left atrial size was normal in size. Right Atrium: Right atrial size was normal in size. Pericardium: There is no evidence of pericardial effusion. Mitral Valve: The mitral valve is normal in structure. Mild mitral annular calcification. No evidence of mitral valve regurgitation. No evidence of mitral valve stenosis. Tricuspid Valve: The tricuspid valve is normal in structure. Tricuspid valve regurgitation is not demonstrated. Aortic Valve: The aortic valve is tricuspid. Aortic valve regurgitation is not visualized. Mild aortic valve sclerosis is present, with no evidence of aortic valve stenosis. Pulmonic Valve: The pulmonic valve was normal in structure. Pulmonic valve regurgitation is not visualized. Aorta: The aortic root is normal in size and structure. Venous: The inferior vena cava is  normal in size with greater than 50% respiratory variability, suggesting right atrial pressure of 3 mmHg. IAS/Shunts: No atrial level shunt detected by color flow Doppler.  LEFT VENTRICLE PLAX 2D LVIDd:         3.30 cm  Diastology LVIDs:         2.10 cm  LV e' lateral:   9.25 cm/s LV PW:         1.30 cm  LV E/e' lateral: 9.0 LV IVS:        1.70 cm  LV e' medial:  6.53 cm/s LVOT diam:     1.80 cm  LV E/e' medial:  12.7 LVOT Area:     2.54 cm  RIGHT VENTRICLE RV S prime:     12.10 cm/s LEFT ATRIUM           Index LA diam:      3.10 cm 1.42 cm/m LA Vol (A2C): 39.5 ml 18.06 ml/m   AORTA Ao Root diam: 2.80 cm MITRAL VALVE MV Area (PHT): 2.96 cm     SHUNTS MV Decel Time: 256 msec     Systemic Diam: 1.80 cm MV E velocity: 82.90 cm/s MV A velocity: 117.00 cm/s MV E/A ratio:  0.71 Loralie Champagne MD Electronically signed by Loralie Champagne MD Signature Date/Time: 08/01/2019/5:59:09 PM    Final     Subjective: No new complaints feels great.  Discharge Exam: Vitals:   08/05/19 0448 08/05/19 1202  BP: (!) 136/57 (!) 133/46  Pulse: 89 91  Resp: 19 18  Temp: 97.8 F (36.6 C) (!) 97.4 F (36.3 C)  SpO2: 98% 98%   Vitals:   08/04/19 1147 08/04/19 1942 08/05/19 0448 08/05/19 1202  BP: (!) 130/55 (!) 147/66 (!) 136/57 (!) 133/46  Pulse: 88 87 89 91  Resp: _0 Temp: 97.6 F (36.4 C) 97.6 F (36.4 C) 97.8 F (36.6 C) (!) 97.4 F (36.3 C)  TempSrc: Oral Oral Oral Oral  SpO2: 98% 97% 98% 98%  Weight:   114.5 kg   Height:        General: Pt is alert, awake, not in acute distress Cardiovascular: RRR, S1/S2 +, no rubs, no gallops Respiratory: CTA bilaterally, no wheezing, no rhonchi Abdominal: Soft, NT, ND, bowel sounds + Extremities: no edema, no cyanosis    The results of significant diagnostics from this hospitalization (including imaging, microbiology, ancillary and laboratory) are listed below for reference.     Microbiology: Recent Results (from the past 240 hour(s))  Culture,  Urine     Status: Abnormal   Collection Time: 07/31/19  6:15 PM   Specimen: Urine, Random  Result Value Ref Range Status   Specimen Description URINE, RANDOM  Final   Special Requests NONE  Final   Culture (A)  Final    <10,000 COLONIES/mL INSIGNIFICANT GROWTH Performed at [facility], [address] [place], [place] [address]    Report Status 08/01/2019 FINAL  Final  SARS Coronavirus 2 by RT PCR (hospital order, performed in [facility] hospital lab) Nasopharyngeal Nasopharyngeal Swab     Status: None   Collection Time: 07/31/19  9:05 PM   Specimen: Nasopharyngeal Swab  Result Value Ref Range Status   SARS Coronavirus 2 NEGATIVE NEGATIVE Final    Comment: (NOTE) SARS-CoV-2 target nucleic acids are NOT DETECTED. The SARS-CoV-2 RNA is generally detectable in upper and lower respiratory specimens during the acute phase of infection. The lowest concentration of SARS-CoV-2 viral copies this assay can detect is 250 copies / mL. A negative result does not preclude SARS-CoV-2 infection and should not be used as the sole basis for treatment or other patient management decisions.  A negative result may occur with improper specimen collection / handling, submission of specimen other than nasopharyngeal swab, presence of viral mutation(s) within the areas targeted by this assay, and inadequate number of viral copies (<250 copies / mL). A negative result must be combined with clinical observations, patient history, and epidemiological information. Fact Sheet for Patients:   StrictlyIdeas.no Fact Sheet for Healthcare Providers: BankingDealers.co.za  This test is not yet approved or cleared  by the [place] and has been authorized for detection and/or diagnosis of SARS-CoV-2 by FDA under an Emergency Use Authorization (EUA).  This EUA will remain in effect (meaning this test can be used) for the duration of the COVID-19 declaration  under Section 564(b)(1) of the Act, 21 U.S.C. section 360bbb-3(b)(1), unless the authorization is terminated or revoked sooner. Performed at [facility], [place] [address], [place], [place] [address]      Labs: BNP (last 3 results) Recent Labs    07/31/19 1859  BNP 833.8*   Basic Metabolic Panel: Recent Labs  Lab 07/31/19 1544 07/31/19 1858 08/01/19 0711 08/02/19 0343 08/03/19 0349 08/04/19 0552 08/05/19 0509  NA   < >  --  138 136 135 135 135  K   < >  --  4.7 4.8 5.0 4.7 4.4  CL   < >  --  106 110 105 106 106  CO2   < >  --  17* 16* 17* 14* 16*  GLUCOSE   < >  --  129* 184* 207* 152* 148*  BUN   < >  --  38* 51* 56* 58* 59*  CREATININE   < >  --  2.17* 2.45* 2.44* 2.33* 2.25*  CALCIUM   < >  --  9.3 8.6* 8.5* 8.7* 8.9  MG  --  2.2  --   --   --   --   --    < > = values in this interval not displayed.   Liver Function Tests: No results for input(s): AST, ALT, ALKPHOS, BILITOT, PROT, ALBUMIN in the last 168 hours. No results for input(s): LIPASE, AMYLASE in the last 168 hours. No results for input(s): AMMONIA in the last 168 hours. CBC: Recent Labs  Lab 07/31/19 1544  WBC 11.2*  HGB 11.0*  HCT 35.8*  MCV 96.5  PLT 271   Cardiac Enzymes: No results for input(s): CKTOTAL, CKMB, CKMBINDEX, TROPONINI in the last 168 hours. BNP: Invalid input(s): POCBNP CBG: Recent Labs  Lab 08/04/19 1144 08/04/19 1640 08/04/19 2134 08/05/19 0616 08/05/19 1159  GLUCAP 182* 189* 189* 151* 151*   D-Dimer No results for input(s): DDIMER in the last 72 hours. Hgb A1c No results for input(s): HGBA1C in the last 72 hours. Lipid Profile No results for input(s): CHOL, HDL, LDLCALC, TRIG, CHOLHDL, LDLDIRECT in the last 72 hours. Thyroid function studies No results for input(s): TSH, T4TOTAL, T3FREE, THYROIDAB in the last 72 hours.  Invalid input(s): FREET3 Anemia work up No results for input(s): VITAMINB12, FOLATE, FERRITIN, TIBC, IRON, RETICCTPCT in the last 72  hours. Urinalysis    Component Value Date/Time   COLORURINE AMBER (A) 08/01/2019 0215   APPEARANCEUR CLEAR 08/01/2019 0215   LABSPEC 1.006 08/01/2019 0215   PHURINE 5.0 08/01/2019 0215   GLUCOSEU NEGATIVE 08/01/2019 0215   HGBUR MODERATE (A) 08/01/2019 0215   BILIRUBINUR NEGATIVE 08/01/2019 0215   BILIRUBINUR NEG 11/27/2011 0922   KETONESUR NEGATIVE 08/01/2019 0215   PROTEINUR NEGATIVE 08/01/2019 0215   UROBILINOGEN 1.0 11/27/2011 0922   UROBILINOGEN 0.2 06/29/2009 1325   NITRITE POSITIVE (A) 08/01/2019 0215   LEUKOCYTESUR NEGATIVE 08/01/2019 0215   Sepsis Labs Invalid input(s): PROCALCITONIN,  WBC,  LACTICIDVEN Microbiology Recent Results (from the past 240 hour(s))  Culture, Urine     Status: Abnormal   Collection Time: 07/31/19  6:15 PM   Specimen: Urine, Random  Result Value Ref Range Status  Specimen Description URINE, RANDOM  Final   Special Requests NONE  Final   Culture (A)  Final    <10,000 COLONIES/mL INSIGNIFICANT GROWTH Performed at [facility], [place] [address] [place], [place] [address]    Report Status 08/01/2019 FINAL  Final  SARS Coronavirus 2 by RT PCR (hospital order, performed in [facility] hospital lab) Nasopharyngeal Nasopharyngeal Swab     Status: None   Collection Time: 07/31/19  9:05 PM   Specimen: Nasopharyngeal Swab  Result Value Ref Range Status   SARS Coronavirus 2 NEGATIVE NEGATIVE Final    Comment: (NOTE) SARS-CoV-2 target nucleic acids are NOT DETECTED. The SARS-CoV-2 RNA is generally detectable in upper and lower respiratory specimens during the acute phase of infection. The lowest concentration of SARS-CoV-2 viral copies this assay can detect is 250 copies / mL. A negative result does not preclude SARS-CoV-2 infection and should not be used as the sole basis for treatment or other patient management decisions.  A negative result may occur with improper specimen collection / handling, submission of specimen other than  nasopharyngeal swab, presence of viral mutation(s) within the areas targeted by this assay, and inadequate number of viral copies (<250 copies / mL). A negative result must be combined with clinical observations, patient history, and epidemiological information. Fact Sheet for Patients:   StrictlyIdeas.no Fact Sheet for Healthcare Providers: BankingDealers.co.za This test is not yet approved or cleared  by the [place] FDA and has been authorized for detection and/or diagnosis of SARS-CoV-2 by FDA under an Emergency Use Authorization (EUA).  This EUA will remain in effect (meaning this test can be used) for the duration of the COVID-19 declaration under Section 564(b)(1) of the Act, 21 U.S.C. section 360bbb-3(b)(1), unless the authorization is terminated or revoked sooner. Performed at [facility], [place] [address], [place], [place] [address]      Time coordinating discharge: Over 40 minutes  SIGNED:   Charlynne Cousins, MD  [facility] 08/05/2019, 12:56 PM Pager   If 7PM-7AM, please contact night-coverage [email or web address] Password [email or web address]

## 2019-08-05 NOTE — Progress Notes (Signed)
Progress Note  Patient Name: Kaitlin Berry Date of Encounter: 08/05/2019  Primary Cardiologist: Fransico Him, MD   Subjective   No chest pain or sob. "I feel better. Can I go home?"  Inpatient Medications    Scheduled Meds: . citalopram  40 mg Oral Daily  . famotidine  20 mg Oral Daily  . heparin  5,000 Units Subcutaneous Q8H  . insulin aspart  0-15 Units Subcutaneous TID WC  . insulin aspart  0-5 Units Subcutaneous QHS  . insulin aspart  5 Units Subcutaneous TID WC  . insulin glargine  30 Units Subcutaneous BID  . isosorbide-hydrALAZINE  1 tablet Oral TID  . polyethylene glycol  17 g Oral Once  . sodium chloride flush  3 mL Intravenous Q12H   Continuous Infusions: . sodium chloride     PRN Meds: sodium chloride, acetaminophen, loperamide, melatonin, ondansetron (ZOFRAN) IV, sodium chloride, sodium chloride flush   Vital Signs    Vitals:   08/04/19 0350 08/04/19 1147 08/04/19 1942 08/05/19 0448  BP: (!) 150/70 (!) 130/55 (!) 147/66 (!) 136/57  Pulse: 97 88 87 89  Resp: 19 19 19 19   Temp: 98.1 F (36.7 C) 97.6 F (36.4 C) 97.6 F (36.4 C) 97.8 F (36.6 C)  TempSrc: Oral Oral Oral Oral  SpO2: 94% 98% 97% 98%  Weight: 115 kg   114.5 kg  Height:        Intake/Output Summary (Last 24 hours) at 08/05/2019 0900 Last data filed at 08/05/2019 0448 Gross per 24 hour  Intake 720 ml  Output 1600 ml  Net -880 ml   Filed Weights   08/03/19 0328 08/04/19 0350 08/05/19 0448  Weight: 115.4 kg 115 kg 114.5 kg    Telemetry    nsr - Personally Reviewed  ECG    none - Personally Reviewed  Physical Exam   GEN: morbidly obese, no acute distress.   Neck: No JVD Cardiac: RRR, no murmurs, rubs, or gallops.  Respiratory: Clear to auscultation bilaterally. GI: Soft, nontender, non-distended  MS: No edema; No deformity. Neuro:  Nonfocal  Psych: Normal affect   Labs    Chemistry Recent Labs  Lab 08/03/19 0349 08/04/19 0552 08/05/19 0509  NA 135 135 135  K  5.0 4.7 4.4  CL 105 106 106  CO2 17* 14* 16*  GLUCOSE 207* 152* 148*  BUN 56* 58* 59*  CREATININE 2.44* 2.33* 2.25*  CALCIUM 8.5* 8.7* 8.9  GFRNONAA 19* 20* 21*  GFRAA 22* 23* 24*  ANIONGAP 13 15 13      Hematology Recent Labs  Lab 07/31/19 1544  WBC 11.2*  RBC 3.71*  HGB 11.0*  HCT 35.8*  MCV 96.5  MCH 29.6  MCHC 30.7  RDW 14.6  PLT 271    Cardiac EnzymesNo results for input(s): TROPONINI in the last 168 hours. No results for input(s): TROPIPOC in the last 168 hours.   BNP Recent Labs  Lab 07/31/19 1859  BNP 109.7*     DDimer  Recent Labs  Lab 07/31/19 1858  DDIMER 1.01*     Radiology    No results found.  Cardiac Studies   none  Patient Profile     74 y.o. female admitted with acute on chronic heart failure, now back to baseline.  Assessment & Plan    1. Acute on chronic diastolic heart failure - she appears to be euvolemic and her weight is better than it was in our office 2 weeks ago. She can be discharged home.  I would suggest at DC her lasix be increased to 60 mg daily from 40 mg daily. 2.  HTN - her sbp is better. Continue current meds except as above. 3. Stage 4, CKD - her creatinine is a bit better. She will need a repeat BMP in a week as an outpatient.    For questions or updates, please contact [place] Please consult [email or web address] for contact info under Cardiology/STEMI.   Signed, Cristopher Peru, MD  08/05/2019, 9:00 AM  Patient ID: Kaitlin Berry, female   DOB: 15-Jun-1945, 74 y.o.   MRN: [id number]

## 2019-08-05 NOTE — TOC Transition Note (Signed)
Transition of Care [facility]) - CM/SW Discharge Note   Patient Details  Name: Kaitlin Berry MRN: [id number] Date of Birth: 1946-02-05  [facility] CM/SW Contact:  Carles Collet, RN Phone Number: 08/05/2019, 11:32 AM   Clinical Narrative:   Spoke w patient and she is agreeable to any [facility] provider with 4 or 5 star Medicare ratings.  Referral made to [facility]. No other CM needs identified.     Final next level of care: [place] Barriers to Discharge: No Barriers Identified   Patient Goals and CMS Choice Patient states their goals for this hospitalization and ongoing recovery are:: to go home CMS Medicare.gov Compare Post Acute Care list provided to:: Patient Choice offered to / list presented to : Patient  Discharge Placement                       Discharge Plan and Services                          HH Arranged: PT, OT [facility] Agency: [place] Date [facility] Agency Contacted: 08/05/19 Time [place]: 1132 Representative spoke with at [place]: Cyndi  Social Determinants of Health ([place]) Interventions     Readmission Risk Interventions No flowsheet data found.

## 2019-08-09 ENCOUNTER — Telehealth: Payer: Self-pay | Admitting: Cardiology

## 2019-08-09 NOTE — Telephone Encounter (Signed)
Spoke with Sharyn Lull and she is going to contact the patient's PCP. She did not have an updated physician list for the patient.

## 2019-08-09 NOTE — Telephone Encounter (Signed)
Sharyn Lull from [facility] calling to request verbal orders for home health occupational therapy 1 time a week for 4 weeks.

## 2019-08-13 ENCOUNTER — Encounter: Payer: Self-pay | Admitting: Physician Assistant

## 2019-08-13 ENCOUNTER — Ambulatory Visit: Payer: Medicare HMO | Admitting: Physician Assistant

## 2019-08-13 ENCOUNTER — Other Ambulatory Visit: Payer: Self-pay

## 2019-08-13 DIAGNOSIS — B372 Candidiasis of skin and nail: Secondary | ICD-10-CM

## 2019-08-13 DIAGNOSIS — B351 Tinea unguium: Secondary | ICD-10-CM

## 2019-08-13 MED ORDER — CICLOPIROX OLAMINE 0.77 % EX CREA: TOPICAL_CREAM | Freq: Two times a day (BID) | CUTANEOUS | 1 refills | Status: DC

## 2019-08-13 NOTE — Progress Notes (Signed)
° °  Follow up Visit  Subjective  Kaitlin Berry is a 74 y.o. female who presents for the following: Nail Problem (HANDS AND FEET X WEEKS FUNGUS NO TREATMENT). Noticed a discoloration with fingernails a few months ago. She is also having some lifting. The toenails dont seem as bad and are just discolored. She is continually having trouble with sores under her breasts and abdomen. She feels like she has them a lot. She has used neosporin and tinactin on and off and is often not sure what to use when.    Objective  Well appearing patient in no apparent distress; mood and affect are within normal limits.  A focused examination was performed including hands, feet, abdomen and back.. Relevant physical exam findings are noted in the Assessment and Plan. No suspicious moles noted on back.   Objective  Right Flank: Erythematous macerated rash under her right breast.  Objective  Left Hallux Toe Nail Plate, Left Thumb Hyponychium, Right Hallux Toe Nail Plate, Right Thumb Lateral Paronychium: Fingernails are discolored and lifting. 5th nails both hands have a thickening underneath.   Images      Assessment & Plan  Candidal dermatitis Right Flank  Ordered Medications: ciclopirox (LOPROX) 0.77 % cream  Onychomycosis (4) Right Thumb Lateral Paronychium; Left Thumb Hyponychium; Left Hallux Toe Nail Plate; Right Hallux Toe Nail Plate  PAS stain to thumbnails and toenails. To determine if both or one are fungus. She was also instructed to cut all the fingernails off as short as she can and keep them dry. We also discussed dermanail.  Specimen 1 - Surgical pathology Differential Diagnosis: r/o fungus Check Margins: No Fingernails are discolored and lifting. 5th nails both hands have a thickening underneath. PAS stain  Specimen 2 - Surgical pathology Differential Diagnosis: r/o fungus Check Margins: No Fingernails are discolored and lifting. 5th nails both hands have a thickening  underneath. PAS Stain

## 2019-08-16 ENCOUNTER — Telehealth: Payer: Self-pay | Admitting: *Deleted

## 2019-08-16 MED ORDER — NUVAIL EX SOLN: 1.0000 "application " | Freq: Every morning | CUTANEOUS | 1 refills | Status: DC

## 2019-08-16 NOTE — Telephone Encounter (Signed)
Left message to return out call

## 2019-08-16 NOTE — Telephone Encounter (Signed)
Informed patient of Kaitlin Berry options: Patient wanting to try Rx Nuvail- informed patient that according to her insurance plan its not covered: patient want to try prescription anyway.  Prescription sent to [facility] per Lars Pinks Bruning.  Told patient to call us if needed.

## 2019-08-16 NOTE — Telephone Encounter (Signed)
Patient returned your call. She was informed that we will reach back out to her.

## 2019-08-16 NOTE — Telephone Encounter (Signed)
-----   Message from Arlyss Gandy, [place] sent at 08/15/2019  1:47 PM EDT ----- Clippings negative for fungus. May more likely be psoriasis of nails. We have the option to attempt a fungal culture. Can take 4-6 weeks to get results. Can try nuvail which is a product for thick nails. Insurance may not cover. Also option to see Dr. Bryson Dames who is a nail specialist.

## 2019-08-21 ENCOUNTER — Telehealth (INDEPENDENT_AMBULATORY_CARE_PROVIDER_SITE_OTHER): Payer: Medicare HMO | Admitting: Cardiology

## 2019-08-21 ENCOUNTER — Encounter: Payer: Self-pay | Admitting: Cardiology

## 2019-08-21 ENCOUNTER — Other Ambulatory Visit: Payer: Self-pay

## 2019-08-21 ENCOUNTER — Telehealth: Payer: Self-pay | Admitting: Radiology

## 2019-08-21 VITALS — BP 156/59 | HR 81 | Ht 64.0 in | Wt 150.0 lb

## 2019-08-21 DIAGNOSIS — I10 Essential (primary) hypertension: Secondary | ICD-10-CM | POA: Diagnosis not present

## 2019-08-21 DIAGNOSIS — I5032 Chronic diastolic (congestive) heart failure: Secondary | ICD-10-CM | POA: Diagnosis not present

## 2019-08-21 DIAGNOSIS — G4733 Obstructive sleep apnea (adult) (pediatric): Secondary | ICD-10-CM | POA: Diagnosis not present

## 2019-08-21 DIAGNOSIS — R002 Palpitations: Secondary | ICD-10-CM

## 2019-08-21 DIAGNOSIS — N184 Chronic kidney disease, stage 4 (severe): Secondary | ICD-10-CM

## 2019-08-21 MED ORDER — CARVEDILOL 3.125 MG PO TABS
3.1250 mg | ORAL_TABLET | Freq: Two times a day (BID) | ORAL | 3 refills | Status: DC
Start: 2019-08-21 — End: 2019-11-20

## 2019-08-21 NOTE — Patient Instructions (Signed)
Medication Instructions:  Your physician has recommended you make the following change in your medication:  1) START taking carvedilol (Coreg) 3.125 mg twice daily  *If you need a refill on your cardiac medications before your next appointment, please call your pharmacy*  Testing/Procedures: Your physician has recommended that you wear an event monitor. Event monitors are medical devices that record the heart's electrical activity. Doctors most often [place] these monitors to diagnose arrhythmias. Arrhythmias are problems with the speed or rhythm of the heartbeat. The monitor is a small, portable device. You can wear one while you do your normal daily activities. This is usually used to diagnose what is causing palpitations/syncope (passing out).  Follow-Up: At [facility], you and your health needs are our priority.  As part of our continuing mission to provide you with exceptional heart care, we have created designated Provider Care Teams.  These Care Teams include your primary Cardiologist (physician) and Advanced Practice Providers (APPs -  Physician Assistants and Nurse Practitioners) who all work together to provide you with the care you need, when you need it.   Your next appointment:   2 week(s)  The format for your next appointment:   Either In Person or Virtual  Provider:   Melina Copa, PA-C or Ermalinda Barrios, PA-C  Follow up with Dr. Radford Pax in  3 months   Other Instructions You have been referred to nephrology. Someone will be calling you to set up an appointment.

## 2019-08-21 NOTE — Addendum Note (Signed)
Addended by: Antonieta Iba on: 08/21/2019 08:30 AM   Modules accepted: Orders

## 2019-08-21 NOTE — Progress Notes (Signed)
Virtual Visit via Telephone Note   This visit type was conducted due to national recommendations for restrictions regarding the COVID-19 Pandemic (e.g. social distancing) in an effort to limit this patient's exposure and mitigate transmission in our community.  Due to her co-morbid illnesses, this patient is at least at moderate risk for complications without adequate follow up.  This format is felt to be most appropriate for this patient at this time.  All issues noted in this document were discussed and addressed.  A limited physical exam was performed with this format.  Please refer to the patient's chart for her consent to telehealth for [facility].   Evaluation Performed:  Follow-up visit  This visit type was conducted due to national recommendations for restrictions regarding the COVID-19 Pandemic (e.g. social distancing).  This format is felt to be most appropriate for this patient at this time.  All issues noted in this document were discussed and addressed.  No physical exam was performed (except for noted visual exam findings with Video Visits).  Please refer to the patient's chart (MyChart message for video visits and phone note for telephone visits) for the patient's consent to telehealth for [facility].  Date:  08/21/2019   ID:  Kaitlin Berry, DOB September 14, 1945, MRN [id number]  Patient Location:  Home  Provider location:   [place]  PCP:  Christain Sacramento, MD  Sleep Medicine:  Fransico Him, MD Electrophysiologist:  None   Chief Complaint:  OSA, HTN, CHF  History of Present Illness:    Kaitlin Berry is a 74 y.o. female who presents via audio/video conferencing for a telehealth visit today.    Kaitlin Berry is a 74 y.o. female with a hx of morbid obesity, chronic back pain, DM, HTN and hyperlipidemia originally saw me for shortness of breath.  At that time it was felt that her shortness of breath was multifactorial due to morbid obesity, sedentary lifestyle and  diastolic CHF.  She has a strong family history of coronary disease.  Work-up included 2D echocardiogram showing normal LV function with EF 60 to 65% and grade 1 diastolic dysfunction.  Nuclear stress test showed no inducible ischemia. She also has OSA and is on CPAP.   She is here today for followup and is doing well.  She was in the hospital last month with a CHF exacerbation due to not being compliant with her diuretics and with UTI.  She had acute on chronic CHF and was treated with IV diuretics. Since her discharge she is now on Bidil and says that after she takes her Bidil her heart will start to pound and also notices this when she takes her Insulin.  She says that she also feels her heart racing when she is moving around.  She thinks that she is holding on to fluid in her abdomen.  Her weight when she got home was 151lbs and her weight has been stable.  2D echo in hospital showed normal LVF with G2DD.  She denies any chest pain or pressure, SOB, DOE, PND, orthopnea, LE edema, dizziness,or syncope. She is compliant with her meds and is tolerating meds with no SE.    She is doing well with her CPAP device and thinks that she has gotten used to it.  She tolerates the mask and feels the pressure is adequate.  Since going on CPAP she feels rested in the am and has no significant daytime sleepiness.  She denies any significant mouth or nasal dryness  or nasal congestion.  She does not think that he snores.     Prior CV studies:   The following studies were reviewed today:  2D echo  Past Medical History:  Diagnosis Date  . Acute cystitis 11/27/2011  . Anxiety   . Arthritis    rt knee  . BACK PAIN 03/24/2007   Qualifier: Diagnosis of  By: Gwenette Greet MD, Armando Reichert   . Chronic back pain   . Depression   . DEPRESSION 03/24/2007   Qualifier: Diagnosis of  By: Gwenette Greet MD, Armando Reichert   . Diabetes mellitus without complication ([place])   . GERD (gastroesophageal reflux disease)   . Hyperlipidemia   . HYPERSOMNIA  03/24/2007   Qualifier: Diagnosis of  By: Gwenette Greet MD, Armando Reichert   . Hypertension   . Morbid obesity with BMI of 45.0-49.9, adult ([employer])   . Obesity 04/22/2011  . OBSTRUCTIVE SLEEP APNEA 05/17/2007   severe with AHI 35.1 on PSG 2009 - on CPAP  . Sleep apnea    CPAP  . Urticaria 11/27/2011   Past Surgical History:  Procedure Laterality Date  . ABDOMINAL HYSTERECTOMY    . BACK SURGERY     lumbar  . BREAST LUMPECTOMY WITH RADIOACTIVE SEED LOCALIZATION Right 10/01/2014   Procedure: RIGHT BREAST LUMPECTOMY WITH RADIOACTIVE SEED LOCALIZATION;  Surgeon: Donnie Mesa, MD;  Location: [facility];  Service: General;  Laterality: Right;  . CHOLECYSTECTOMY    . INCISIONAL BREAST BIOPSY     multiple  . SPINE SURGERY       Current Meds  Medication Sig  . blood glucose meter kit and supplies KIT Dispense based on patient and insurance preference. Use up to four times daily as directed. (FOR ICD-9 250.00, 250.01).  . ciclopirox (LOPROX) 0.77 % cream Apply topically 2 (two) times daily.  . citalopram (CELEXA) 40 MG tablet Take 40 mg by mouth daily.  . Dermatological Products, Misc. (NUVAIL) SOLN Apply 1 application topically every morning.  Marland Kitchen FIASP FLEXTOUCH 100 UNIT/ML FlexTouch Pen Inject 20 Units into the skin See admin instructions. 50 units per meal and if reading is 300 or more,  take additional 50 units, as needed.  . furosemide (LASIX) 40 MG tablet Take 1.5 tablets (60 mg total) by mouth daily.  Marland Kitchen HYDROcodone-acetaminophen (NORCO/VICODIN) 5-325 MG per tablet Take 1 tablet by mouth every 4 (four) hours as needed.  . insulin degludec (TRESIBA FLEXTOUCH) 100 UNIT/ML SOPN FlexTouch Pen Inject 70 Units into the skin daily.   . isosorbide-hydrALAZINE (BIDIL) 20-37.5 MG tablet Take 1 tablet by mouth 3 (three) times daily. (Patient taking differently: Take 1 tablet by mouth in the morning and at bedtime. )  . ranitidine (ZANTAC) 150 MG tablet Take 150 mg by mouth 2 (two) times daily.  .  vitamin B-12 (CYANOCOBALAMIN) 1000 MCG tablet Take 1,000 mcg by mouth daily.     Allergies:   Atorvastatin, Ciprofloxacin, Ciprofloxacin hcl, and Lisinopril   Social History   Tobacco Use  . Smoking status: Never Smoker  . Smokeless tobacco: Never Used  Vaping Use  . Vaping Use: Never used  Substance Use Topics  . Alcohol use: Yes    Comment: social  . Drug use: No     Family Hx: The patient's family history includes Alzheimer's disease in her mother; Cancer in her father; Diabetes in her brother and father; Heart attack in her brother; Heart disease in her father; Lung cancer in her brother; Stroke in her brother.  ROS:  Please see the history of present illness.     All other systems reviewed and are negative.   Labs/Other Tests and Data Reviewed:    Recent Labs: 07/31/2019: B Natriuretic Peptide 109.7; Hemoglobin 11.0; Magnesium 2.2; Platelets 271 08/05/2019: BUN 59; Creatinine, Ser 2.25; Potassium 4.4; Sodium 135   Recent Lipid Panel No results found for: CHOL, TRIG, HDL, CHOLHDL, LDLCALC, LDLDIRECT  Wt Readings from Last 3 Encounters:  08/21/19 150 lb (68 kg)  08/05/19 252 lb 6.4 oz (114.5 kg)  07/31/19 263 lb (119.3 kg)     Objective:    Vital Signs:  BP (!) 156/59   Pulse 81   Ht 5' 4"  (1.626 m)   Wt 150 lb (68 kg)   BMI 25.75 kg/m     ASSESSMENT & PLAN:    1.  Chronic diastolic CHF -her weight is stable and she has not had any DOE.  LE edema is stable -she feels like she is holding on to a little bit of fluid in her abdomen -continue Lasix 30m daily -I have instructed to her to take an extra 258mLasix PRN for fluid retention -follow a <2gm Na diet  -<2L fluid daily -weight daily and call if she gains 3lbs in 1 day or 5lbs in 1 week   2.  HTN -BP borderline controlled -continue Bidil 20-37.5 1 tablet TID -add Carvedilol 3.12511mID -check BP daily for a week and call with results  3.  OSA - The patient is tolerating PAP therapy well without  any problems. The PAP download was reviewed today and showed an AHI of 0.3/hr  with 96% compliance in using more than 4 hours nightly.  The patient has been using and benefiting from PAP use and will continue to benefit from therapy.   4.  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  Type 2 DM -followed by PCP -continue Trulicity and Tresiba  6.  Palpitations -unclear etiology -she thinks it is related to taking some meds -we are adding carvedilol which should help -I will get a 30 day event monitor to assess   Patient Risk:   After full review of this patient's clinical status, I feel that they are at least moderate risk at this time.  Time:   Today, I have spent 20 minutes directly with the patient on telemedicine discussing medical problems including OSA, CHF, HTN, obesity and reviewing patient's chart   Medication Adjustments/Labs and Tests Ordered: Current medicines are reviewed at length with the patient today.  Concerns regarding medicines are outlined above.  Tests Ordered: No orders of the defined types were placed in this encounter.  Medication Changes: No orders of the defined types were placed in this encounter.   Disposition:  Follow up in 2 weeks with PA and me in 3 months  Signed, TraFransico HimD  08/21/2019 8:15 AM    [place]

## 2019-08-21 NOTE — Telephone Encounter (Signed)
Enrolled patient for a 30 day [employer] Event Monitor to be mailed to patients home  

## 2019-08-29 ENCOUNTER — Encounter (INDEPENDENT_AMBULATORY_CARE_PROVIDER_SITE_OTHER): Payer: Medicare HMO

## 2019-08-29 DIAGNOSIS — R002 Palpitations: Secondary | ICD-10-CM

## 2019-09-10 ENCOUNTER — Telehealth (INDEPENDENT_AMBULATORY_CARE_PROVIDER_SITE_OTHER): Payer: Medicare HMO | Admitting: Physician Assistant

## 2019-09-10 ENCOUNTER — Encounter: Payer: Self-pay | Admitting: Physician Assistant

## 2019-09-10 ENCOUNTER — Other Ambulatory Visit: Payer: Self-pay

## 2019-09-10 VITALS — BP 126/56 | HR 60 | Ht 64.0 in | Wt 248.8 lb

## 2019-09-10 DIAGNOSIS — R002 Palpitations: Secondary | ICD-10-CM

## 2019-09-10 DIAGNOSIS — I10 Essential (primary) hypertension: Secondary | ICD-10-CM | POA: Diagnosis not present

## 2019-09-10 DIAGNOSIS — I5032 Chronic diastolic (congestive) heart failure: Secondary | ICD-10-CM | POA: Diagnosis not present

## 2019-09-10 MED ORDER — ISOSORB DINITRATE-HYDRALAZINE 20-37.5 MG PO TABS: 1.0000 | ORAL_TABLET | Freq: Three times a day (TID) | ORAL | 3 refills | Status: DC

## 2019-09-10 NOTE — Patient Instructions (Addendum)
Medication Instructions:  Your physician recommends that you continue on your current medications as directed. Please refer to the Current Medication list given to you today.  *If you need a refill on your cardiac medications before your next appointment, please call your pharmacy*   Lab Work: None ordered  If you have labs (blood work) drawn today and your tests are completely normal, you will receive your results only by: Marland Kitchen MyChart Message (if you have MyChart) OR . A paper copy in the mail If you have any lab test that is abnormal or we need to change your treatment, we will call you to review the results.   Testing/Procedures: None ordered   Follow-Up: At [facility], you and your health needs are our priority.  As part of our continuing mission to provide you with exceptional heart care, we have created designated Provider Care Teams.  These Care Teams include your primary Cardiologist (physician) and Advanced Practice Providers (APPs -  Physician Assistants and Nurse Practitioners) who all work together to provide you with the care you need, when you need it.  We recommend signing up for the patient portal called "MyChart".  Sign up information is provided on this After Visit Summary.  MyChart is used to connect with patients for Virtual Visits (Telemedicine).  Patients are able to view lab/test results, encounter notes, upcoming appointments, etc.  Non-urgent messages can be sent to your provider as well.   To learn more about what you can do with MyChart, go to [email or web address].    Your next appointment:   AS SCHEDULED   The format for your next appointment:   Virtual  Provider:   Fransico Him, MD   Other Instructions

## 2019-09-10 NOTE — Progress Notes (Signed)
Virtual Visit via Telephone Note   This visit type was conducted due to national recommendations for restrictions regarding the COVID-19 Pandemic (e.g. social distancing) in an effort to limit this patient's exposure and mitigate transmission in our community.  Due to her co-morbid illnesses, this patient is at least at moderate risk for complications without adequate follow up.  This format is felt to be most appropriate for this patient at this time.  The patient did not have access to video technology/had technical difficulties with video requiring transitioning to audio format only (telephone).  All issues noted in this document were discussed and addressed.  No physical exam could be performed with this format.  Please refer to the patient's chart for her  consent to telehealth for [facility].   The patient was identified using 2 identifiers.  Date:  09/10/2019   ID:  [name], DOB Jun 14, 1945, MRN [id number]  Patient Location: Home Provider Location: Office/Clinic  PCP:  Christain Sacramento, MD  Cardiologist:  Fransico Him, MD   Evaluation Performed:  Follow-Up Visit  Chief Complaint:  3 weeks follow up   History of Present Illness:    Kaitlin Berry is a 74 y.o. female with chronic diastolic heart failure, morbid obesity, chronic back pain, diabetes mellitus, hypertension, hyperlipidemia, strong family history of CAD and obstructive sleep apnea on CPAP seen for follow-up.  Initially established care with Dr. Radford Pax for evaluation of shortness of breath.  Felt her symptoms are multifactorial due to morbid obesity, diastolic dysfunction and sedentary lifestyle.  2D echogram showed LV function of 60 to 65% and grade 1 diastolic dysfunction.  Nuclear stress test without inducible ischemia.  Recently admitted to hospital for CHF exacerbation in setting of noncompliance with diuretics and UTI.  Placed on Bidil.  Echocardiogram with LV function of 60 to 65%, no regional wall motion  abnormality,and grade 1 diastolic dysfunction.  Seen by Dr.Turner 08/21/19 virtually for follow-up.  Patient feels he is holding some fluid in her abdomen.  Continued Lasix.  Advised to take extra 20 mg.  Advised low-sodium diet.  She also complained of palpitation.  Added Coreg.  Pending event monitor.  Patient reports resolved palpitation.  Her volume status has been stable.  No feeling of fluid in her abdomen.  Did not require to take extra Lasix.  Weight stable.  Denies chest pain, shortness of breath, orthopnea, PND, syncope or lower extremity edema.  Complains of joint pain and has follow-up with PCP in 2 weeks.   The patient does not have symptoms concerning for COVID-19 infection (fever, chills, cough, or new shortness of breath).    Past Medical History:  Diagnosis Date  . Acute cystitis 11/27/2011  . Anxiety   . Arthritis    rt knee  . BACK PAIN 03/24/2007   Qualifier: Diagnosis of  By: Gwenette Greet MD, Armando Reichert   . Chronic back pain   . Depression   . DEPRESSION 03/24/2007   Qualifier: Diagnosis of  By: Gwenette Greet MD, Armando Reichert   . Diabetes mellitus without complication ([place])   . GERD (gastroesophageal reflux disease)   . Hyperlipidemia   . HYPERSOMNIA 03/24/2007   Qualifier: Diagnosis of  By: Gwenette Greet MD, Armando Reichert   . Hypertension   . Morbid obesity with BMI of 45.0-49.9, adult ([place])   . Obesity 04/22/2011  . OBSTRUCTIVE SLEEP APNEA 05/17/2007   severe with AHI 35.1 on PSG 2009 - on CPAP  . Sleep apnea    CPAP  .  Urticaria 11/27/2011   Past Surgical History:  Procedure Laterality Date  . ABDOMINAL HYSTERECTOMY    . BACK SURGERY     lumbar  . BREAST LUMPECTOMY WITH RADIOACTIVE SEED LOCALIZATION Right 10/01/2014   Procedure: RIGHT BREAST LUMPECTOMY WITH RADIOACTIVE SEED LOCALIZATION;  Surgeon: Donnie Mesa, MD;  Location: [place];  Service: General;  Laterality: Right;  . CHOLECYSTECTOMY    . INCISIONAL BREAST BIOPSY     multiple  . SPINE SURGERY       Current  Meds  Medication Sig  . blood glucose meter kit and supplies KIT Dispense based on patient and insurance preference. Use up to four times daily as directed. (FOR ICD-9 250.00, 250.01).  . carvedilol (COREG) 3.125 MG tablet Take 1 tablet (3.125 mg total) by mouth 2 (two) times daily with a meal.  . ciclopirox (LOPROX) 0.77 % cream Apply topically 2 (two) times daily.  . citalopram (CELEXA) 40 MG tablet Take 40 mg by mouth daily.  . Dermatological Products, Misc. (NUVAIL) SOLN Apply 1 application topically every morning.  . Dulaglutide (TRULICITY) 8.93 YB/0.1BP SOPN Inject 0.75 mg into the skin once a week.  Marland Kitchen FIASP FLEXTOUCH 100 UNIT/ML FlexTouch Pen Inject 20 Units into the skin See admin instructions. 50 units per meal and if reading is 300 or more,  take additional 50 units, as needed.  . furosemide (LASIX) 40 MG tablet Take 1.5 tablets (60 mg total) by mouth daily.  Marland Kitchen HYDROcodone-acetaminophen (NORCO/VICODIN) 5-325 MG per tablet Take 1 tablet by mouth every 4 (four) hours as needed.  . insulin degludec (TRESIBA FLEXTOUCH) 100 UNIT/ML SOPN FlexTouch Pen Inject 70 Units into the skin daily.   . isosorbide-hydrALAZINE (BIDIL) 20-37.5 MG tablet Take by mouth 3 (three) times daily.  . ranitidine (ZANTAC) 150 MG tablet Take 150 mg by mouth 2 (two) times daily.  . vitamin B-12 (CYANOCOBALAMIN) 1000 MCG tablet Take 1,000 mcg by mouth daily.     Allergies:   Atorvastatin, Ciprofloxacin, Ciprofloxacin hcl, and Lisinopril   Social History   Tobacco Use  . Smoking status: Never Smoker  . Smokeless tobacco: Never Used  Vaping Use  . Vaping Use: Never used  Substance Use Topics  . Alcohol use: Yes    Comment: social  . Drug use: No     Family Hx: The patient's family history includes Alzheimer's disease in her mother; Cancer in her father; Diabetes in her brother and father; Heart attack in her brother; Heart disease in her father; Lung cancer in her brother; Stroke in her brother.  ROS:     Please see the history of present illness.    All other systems reviewed and are negative.   Prior CV studies:   The following studies were reviewed today:  Echo 08/01/19 1. Left ventricular ejection fraction, by estimation, is 60 to 65%. The  left ventricle has normal function. The left ventricle has no regional  wall motion abnormalities. There is moderate left ventricular hypertrophy.  Left ventricular diastolic  parameters are consistent with Grade I diastolic dysfunction (impaired  relaxation).  2. Right ventricular systolic function is normal. The right ventricular  size is normal. Tricuspid regurgitation signal is inadequate for assessing  PA pressure.  3. The mitral valve is normal in structure. No evidence of mitral valve  regurgitation. No evidence of mitral stenosis.  4. The aortic valve is tricuspid. Aortic valve regurgitation is not  visualized. Mild aortic valve sclerosis is present, with no evidence of  aortic valve  stenosis.  5. The inferior vena cava is normal in size with greater than 50%  respiratory variability, suggesting right atrial pressure of 3 mmHg.  6. Technically difficult study with poor acoustic windows.   Stress test 11/2016  Nuclear stress EF: 73%. The left ventricular ejection fraction is hyperdynamic (>65%).  The study is normal.  This is a low risk study.     Labs/Other Tests and Data Reviewed:    EKG:  No ECG reviewed.  Recent Labs: 07/31/2019: B Natriuretic Peptide 109.7; Hemoglobin 11.0; Magnesium 2.2; Platelets 271 08/05/2019: BUN 59; Creatinine, Ser 2.25; Potassium 4.4; Sodium 135   Recent Lipid Panel No results found for: CHOL, TRIG, HDL, CHOLHDL, LDLCALC, LDLDIRECT  Wt Readings from Last 3 Encounters:  09/10/19 248 lb 12.8 oz (112.9 kg)  08/21/19 150 lb (68 kg)  08/05/19 252 lb 6.4 oz (114.5 kg)     Objective:    Vital Signs:  BP (!) 126/56   Pulse 60   Ht 5' 4"  (1.626 m)   Wt 248 lb 12.8 oz (112.9 kg)   BMI 42.71  kg/m    VITAL SIGNS:  reviewed GEN:  no acute distress PSYCH:  normal affect  ASSESSMENT & PLAN:    1. Chronic diastolic CHF Volume status stable.  Did not require to take extra Lasix.  Continue current medical therapy.  2. HTN Blood pressure stable on current medication.  3. OSA On CPAP.  4. Palpitations Resolved on Coreg.  Continue to be on monitor.  5.  Joint pain -See has follow-up with PCP for further evaluation.  COVID-19 Education: The signs and symptoms of COVID-19 were discussed with the patient and how to seek care for testing (follow up with PCP or arrange E-visit).  The importance of social distancing was discussed today.  Time:   Today, I have spent 6 minutes with the patient with telehealth technology discussing the above problems.     Medication Adjustments/Labs and Tests Ordered: Current medicines are reviewed at length with the patient today.  Concerns regarding medicines are outlined above.   Tests Ordered: No orders of the defined types were placed in this encounter.   Medication Changes: No orders of the defined types were placed in this encounter.   Follow Up:  Either In Person or Virtual in 10 week(s)  Signed, Leanor Kail, PA  09/10/2019 11:01 AM    [place]

## 2019-09-18 ENCOUNTER — Other Ambulatory Visit: Payer: Self-pay | Admitting: Nephrology

## 2019-09-18 DIAGNOSIS — R809 Proteinuria, unspecified: Secondary | ICD-10-CM

## 2019-09-18 DIAGNOSIS — N184 Chronic kidney disease, stage 4 (severe): Secondary | ICD-10-CM

## 2019-09-21 ENCOUNTER — Telehealth: Payer: Self-pay | Admitting: Cardiology

## 2019-09-21 NOTE — Telephone Encounter (Signed)
New Message   Kaitlin Berry with [facility] is calling in reference to a fax that was sent for [place] certification orders for patient that they have not received back to Dr. Radford Pax. The dates 6/9 - 8/7. Please call

## 2019-09-21 NOTE — Telephone Encounter (Signed)
Attempted to call Mariann Laster back. Unable to connect with her or leave voicemail.

## 2019-09-27 ENCOUNTER — Ambulatory Visit: Payer: Medicare HMO | Admitting: Cardiology

## 2019-10-09 ENCOUNTER — Encounter: Payer: Self-pay | Admitting: Physician Assistant

## 2019-10-09 MED ORDER — CICLOPIROX OLAMINE 0.77 % EX CREA
TOPICAL_CREAM | Freq: Two times a day (BID) | CUTANEOUS | 2 refills | Status: DC
Start: 2019-10-09 — End: 2021-05-29

## 2019-10-10 ENCOUNTER — Other Ambulatory Visit: Payer: Self-pay

## 2019-10-10 MED ORDER — NUVAIL EX SOLN: 1.0000 "application " | Freq: Every evening | CUTANEOUS | 0 refills | Status: DC

## 2019-10-22 ENCOUNTER — Other Ambulatory Visit: Payer: Medicare HMO

## 2019-10-30 NOTE — Telephone Encounter (Signed)
° °  Mariann Laster from [facility] calling back to follow up, she said she sent another paper work today to fax# [phone] for Dr Radford Pax to sign and it and send back to them.

## 2019-11-02 NOTE — Telephone Encounter (Signed)
Papers have been signed and faxed back.

## 2019-11-14 ENCOUNTER — Ambulatory Visit
Admission: RE | Admit: 2019-11-14 | Discharge: 2019-11-14 | Disposition: A | Discharge status: Home / Self Care | Payer: Medicare HMO | Source: Ambulatory Visit | Attending: Nephrology | Admitting: Nephrology

## 2019-11-14 DIAGNOSIS — R809 Proteinuria, unspecified: Secondary | ICD-10-CM

## 2019-11-14 DIAGNOSIS — N184 Chronic kidney disease, stage 4 (severe): Secondary | ICD-10-CM

## 2019-11-19 ENCOUNTER — Telehealth: Payer: Self-pay | Admitting: *Deleted

## 2019-11-19 NOTE — Telephone Encounter (Signed)
-----   Message from Sueanne Margarita, MD sent at 11/16/2019  5:36 PM EDT ----- Regarding: RE: TUESDAY APPOINTMENT DOWNLOAD Good AHI and compliance ----- Message ----- From: Freada Bergeron, CMA Sent: 11/16/2019   4:59 PM EDT To: Sueanne Margarita, MD Subject: [facility] APPOINTMENT DOWNLOAD                   Sleep therapy report Kaitlin Berry [id number] (age 74 years) [place], [place] [phone] Oct 17, 2019 - Nov 15, 2019 (within 30 days) Average usage (days used) 10 hours 21 minutes Days used = 4 hours 27/30 (90.00 %) Leak 95th % 11.00 L/min Days used = 6 hours 25/30 (83.33 %) Average AHI 0.36 events per hour  Aug 18, 2019 - Nov 15, 2019 (within 90 days) Average usage (days used) 11 hours 1 minutes Days used = 4 hours 86/90 (95.56 %) Leak 95th % 9.76 L/min Days used = 6 hours 77/90 (85.56 %) Average AHI 0.34 events per hour  Nov 16, 2018 - Nov 15, 2019 (within 365 days) Average usage (days used) 10 hours 51 minutes Days used = 4 hours 347/365 (95.07 %) Leak 95th % 15.63 L/min Days used = 6 hours 320/365 (87.67 %) Average AHI 0.43 events per hour

## 2019-11-19 NOTE — Telephone Encounter (Signed)
Informed patient of compliance results and verbalized understanding was indicated. Patient is aware and agreeable to AHI being within range at 0.43. Patient is aware and agreeable to being in compliance with machine usage. Patient is aware and agreeable to no change in current pressures.

## 2019-11-20 ENCOUNTER — Other Ambulatory Visit: Payer: Self-pay

## 2019-11-20 ENCOUNTER — Encounter: Payer: Self-pay | Admitting: Cardiology

## 2019-11-20 ENCOUNTER — Telehealth (INDEPENDENT_AMBULATORY_CARE_PROVIDER_SITE_OTHER): Payer: Medicare HMO | Admitting: Cardiology

## 2019-11-20 VITALS — BP 195/81 | HR 86 | Ht 64.0 in | Wt 250.0 lb

## 2019-11-20 DIAGNOSIS — N184 Chronic kidney disease, stage 4 (severe): Secondary | ICD-10-CM

## 2019-11-20 DIAGNOSIS — I5032 Chronic diastolic (congestive) heart failure: Secondary | ICD-10-CM | POA: Diagnosis not present

## 2019-11-20 DIAGNOSIS — R002 Palpitations: Secondary | ICD-10-CM

## 2019-11-20 DIAGNOSIS — I10 Essential (primary) hypertension: Secondary | ICD-10-CM

## 2019-11-20 DIAGNOSIS — E119 Type 2 diabetes mellitus without complications: Secondary | ICD-10-CM

## 2019-11-20 MED ORDER — CARVEDILOL 6.25 MG PO TABS
6.2500 mg | ORAL_TABLET | Freq: Two times a day (BID) | ORAL | 3 refills | Status: DC
Start: 2019-11-20 — End: 2021-05-29

## 2019-11-20 NOTE — Addendum Note (Signed)
Addended by: Antonieta Iba on: 11/20/2019 01:29 PM   Modules accepted: Orders

## 2019-11-20 NOTE — Progress Notes (Signed)
Virtual Visit via Telephone Note   This visit type was conducted due to national recommendations for restrictions regarding the COVID-19 Pandemic (e.g. social distancing) in an effort to limit this patient's exposure and mitigate transmission in our community.  Due to her co-morbid illnesses, this patient is at least at moderate risk for complications without adequate follow up.  This format is felt to be most appropriate for this patient at this time.  All issues noted in this document were discussed and addressed.  A limited physical exam was performed with this format.  Please refer to the patient's chart for her consent to telehealth for [facility].   Evaluation Performed:  Follow-up visit  This visit type was conducted due to national recommendations for restrictions regarding the COVID-19 Pandemic (e.g. social distancing).  This format is felt to be most appropriate for this patient at this time.  All issues noted in this document were discussed and addressed.  No physical exam was performed (except for noted visual exam findings with Video Visits).  Please refer to the patient's chart (MyChart message for video visits and phone note for telephone visits) for the patient's consent to telehealth for [facility].  Date:  11/20/2019   ID:  Kaitlin Berry, DOB 1945/05/31, MRN [id number]  Patient Location:  Home  Provider location:   [place]  PCP:  Christain Sacramento, MD  Sleep Medicine:  Fransico Him, MD Electrophysiologist:  None   Chief Complaint:   HTN, CHF  History of Present Illness:    Kaitlin Berry is a 74 y.o. female who presents via audio/video conferencing for a telehealth visit today.    Kaitlin Berry is a 74 y.o. female with a hx of morbid obesity, chronic back pain, DM, HTN and hyperlipidemia originally saw me for shortness of breath.  At that time it was felt that her shortness of breath was multifactorial due to morbid obesity, sedentary lifestyle and  diastolic CHF.  She has a strong family history of coronary disease.  Work-up included 2D echocardiogram showing normal LV function with EF 60 to 65% and grade 1 diastolic dysfunction.  Nuclear stress test showed no inducible ischemia. She also has OSA and is on CPAP.   She is here today for followup and is doing well.  She denies any chest pain or pressure, SOB, DOE, PND, orthopnea, LE edema, dizziness, palpitations or syncope. She is compliant with her meds and is tolerating meds with no SE.    Prior CV studies:   The following studies were reviewed today:  2D echo  Past Medical History:  Diagnosis Date  . Acute cystitis 11/27/2011  . Anxiety   . Arthritis    rt knee  . BACK PAIN 03/24/2007   Qualifier: Diagnosis of  By: Gwenette Greet MD, Armando Reichert   . Chronic back pain   . Depression   . DEPRESSION 03/24/2007   Qualifier: Diagnosis of  By: Gwenette Greet MD, Armando Reichert   . Diabetes mellitus without complication ([place])   . GERD (gastroesophageal reflux disease)   . Hyperlipidemia   . HYPERSOMNIA 03/24/2007   Qualifier: Diagnosis of  By: Gwenette Greet MD, Armando Reichert   . Hypertension   . Morbid obesity with BMI of 45.0-49.9, adult ([place])   . Obesity 04/22/2011  . OBSTRUCTIVE SLEEP APNEA 05/17/2007   severe with AHI 35.1 on PSG 2009 - on CPAP  . Sleep apnea    CPAP  . Urticaria 11/27/2011   Past Surgical History:  Procedure  Laterality Date  . ABDOMINAL HYSTERECTOMY    . BACK SURGERY     lumbar  . BREAST LUMPECTOMY WITH RADIOACTIVE SEED LOCALIZATION Right 10/01/2014   Procedure: RIGHT BREAST LUMPECTOMY WITH RADIOACTIVE SEED LOCALIZATION;  Surgeon: Donnie Mesa, MD;  Location: [place];  Service: General;  Laterality: Right;  . CHOLECYSTECTOMY    . INCISIONAL BREAST BIOPSY     multiple  . SPINE SURGERY       Current Meds  Medication Sig  . blood glucose meter kit and supplies KIT Dispense based on patient and insurance preference. Use up to four times daily as directed. (FOR ICD-9 250.00,  250.01).  . carvedilol (COREG) 3.125 MG tablet Take 1 tablet (3.125 mg total) by mouth 2 (two) times daily with a meal.  . ciclopirox (LOPROX) 0.77 % cream Apply topically 2 (two) times daily.  . citalopram (CELEXA) 40 MG tablet Take 40 mg by mouth daily.  . dapagliflozin propanediol (FARXIGA) 10 MG TABS tablet Take 10 mg by mouth daily.  . Dulaglutide (TRULICITY) 1.02 HE/5.2DP SOPN Inject 0.75 mg into the skin once a week.  Marland Kitchen FIASP FLEXTOUCH 100 UNIT/ML FlexTouch Pen Inject 20 Units into the skin See admin instructions. 50 units per meal and if reading is 300 or more,  take additional 50 units, as needed.  . furosemide (LASIX) 40 MG tablet Take 1.5 tablets (60 mg total) by mouth daily.  Marland Kitchen HYDROcodone-acetaminophen (NORCO/VICODIN) 5-325 MG per tablet Take 1 tablet by mouth every 4 (four) hours as needed.  . insulin degludec (TRESIBA FLEXTOUCH) 100 UNIT/ML SOPN FlexTouch Pen Inject 70 Units into the skin daily.   . isosorbide-hydrALAZINE (BIDIL) 20-37.5 MG tablet Take 1 tablet by mouth 3 (three) times daily.  . ranitidine (ZANTAC) 150 MG tablet Take 150 mg by mouth 2 (two) times daily.  . vitamin B-12 (CYANOCOBALAMIN) 1000 MCG tablet Take 1,000 mcg by mouth daily.     Allergies:   Atorvastatin, Ciprofloxacin, Ciprofloxacin hcl, and Lisinopril   Social History   Tobacco Use  . Smoking status: Never Smoker  . Smokeless tobacco: Never Used  Vaping Use  . Vaping Use: Never used  Substance Use Topics  . Alcohol use: Yes    Comment: social  . Drug use: No     Family Hx: The patient's family history includes Alzheimer's disease in her mother; Cancer in her father; Diabetes in her brother and father; Heart attack in her brother; Heart disease in her father; Lung cancer in her brother; Stroke in her brother.  ROS:   Please see the history of present illness.     All other systems reviewed and are negative.   Labs/Other Tests and Data Reviewed:    Recent Labs: 07/31/2019: B Natriuretic  Peptide 109.7; Hemoglobin 11.0; Magnesium 2.2; Platelets 271 08/05/2019: BUN 59; Creatinine, Ser 2.25; Potassium 4.4; Sodium 135   Recent Lipid Panel No results found for: CHOL, TRIG, HDL, CHOLHDL, LDLCALC, LDLDIRECT  Wt Readings from Last 3 Encounters:  11/20/19 250 lb (113.4 kg)  09/10/19 248 lb 12.8 oz (112.9 kg)  08/21/19 150 lb (68 kg)     Objective:    Vital Signs:  BP (!) 195/81   Pulse 86   Ht _0  (1.626 m)   Wt 250 lb (113.4 kg)   BMI 42.91 kg/m     ASSESSMENT & PLAN:    1.  Chronic diastolic CHF -her weight is up 2lbs from July  -she has not had any DOE and LE edema  is stable -continue Lasix 63m daily and take an extra 238mLasix PRN for fluid retention -follow a <2gm Na diet  -<2L fluid daily -weight daily and call if she gains 3lbs in 1 day or 5lbs in 1 week   2.  HTN -BP poorly controlled on exam today but she has not taken her meds today -continue Bidil 20-37.5 1 tablet TID -increase Carvedilol to 6.2536mID -F/U in HTN clinic 1 week  3.  Morbid Obesity -her exercise is limited due to DJD of her knee and now worsening SOB  4.  Type 2 DM -followed by PCP -HbA1C was elevated at 11.4% -she is noncompliant with her diet -continue Trulicity and [place].  Palpitations -improved on BB -event monitor was normal -continue carvedilol  6.  CKD stage 4 -SCr was 2.17 (has ranged around 2.17-2.45) -followed by nephrology   Patient Risk:   After full review of this patient's clinical status, I feel that they are at least moderate risk at this time.  Time:   Today, I have spent 20 minutes directly with the patient on telemedicine discussing medical problems including OSA, CHF, HTN, obesity and reviewing patient's chart   Medication Adjustments/Labs and Tests Ordered: Current medicines are reviewed at length with the patient today.  Concerns regarding medicines are outlined above.  Tests Ordered: No orders of the defined types were placed in this  encounter.  Medication Changes: No orders of the defined types were placed in this encounter.   Disposition:  Follow up in 6 months  Signed, TraFransico HimD  11/20/2019 1:23 PM     [facility]

## 2019-11-20 NOTE — Patient Instructions (Addendum)
Medication Instructions:  Your physician has recommended you make the following change in your medication:  1) INCREASE carvedilol to 6.25 mg twice daily  *If you need a refill on your cardiac medications before your next appointment, please call your pharmacy*  Follow-Up: At [facility], you and your health needs are our priority.  As part of our continuing mission to provide you with exceptional heart care, we have created designated Provider Care Teams.  These Care Teams include your primary Cardiologist (physician) and Advanced Practice Providers (APPs -  Physician Assistants and Nurse Practitioners) who all work together to provide you with the care you need, when you need it.  Your next appointment:   6 month(s)  The format for your next appointment:   In Person  Provider:   You may see Fransico Him, MD or one of the following Advanced Practice Providers on your designated Care Team:    Melina Copa, PA-C  Ermalinda Barrios, PA-C  Other Instructions You have been referred to see our PharmD in the Hypertension Clinic.

## 2019-12-06 ENCOUNTER — Ambulatory Visit: Payer: Medicare HMO

## 2020-04-23 ENCOUNTER — Other Ambulatory Visit: Payer: Self-pay

## 2020-04-23 ENCOUNTER — Ambulatory Visit: Payer: Medicare HMO | Admitting: Dermatology

## 2020-04-23 ENCOUNTER — Encounter: Payer: Self-pay | Admitting: Dermatology

## 2020-04-23 DIAGNOSIS — L409 Psoriasis, unspecified: Secondary | ICD-10-CM | POA: Diagnosis not present

## 2020-04-23 NOTE — Patient Instructions (Signed)
Risankizumab What is this medicine? RISANKIZUMAB (RIS an KIZ ue mab) is used to treat plaque psoriasis. This medicine may be used for other purposes; ask your health care provider or pharmacist if you have questions. COMMON BRAND NAME(S): Skyrizi What should I tell my health care provider before I take this medicine? They need to know if you have any of these conditions:  immune system problems  infection  recently received or are scheduled to receive a vaccine  tuberculosis, a positive skin test for tuberculosis, or have recently been in close contact with someone who has tuberculosis  an unusual or allergic reaction to risankizumab, other medicines, foods, dyes or preservatives  pregnant or trying to get pregnant  breast-feeding How should I use this medicine? This medicine is for injection under the skin. It is usually given by a health care professional in a hospital or clinic setting. If you get this medicine at home, you will be taught how to prepare and give this medicine. Use exactly as directed. Take your medicine at regular intervals. Do not take your medicine more often than directed. It is important that you put your used needles and syringes in a special sharps container. Do not put them in a trash can. If you do not have a sharps container, call your pharmacist or healthcare provider to get one. A special MedGuide will be given to you by the pharmacist with each prescription and refill. Be sure to read this information carefully each time. Talk to your pediatrician regarding the use of this medicine in children. Special care may be needed. Overdosage: If you think you have taken too much of this medicine contact a poison control center or emergency room at once. NOTE: This medicine is only for you. Do not share this medicine with others. What if I miss a dose? If you miss a dose, take it as soon as you can. Then, continue dosing at the regularly scheduled time. What may  interact with this medicine? Do not take this medicine with any of the following medications:  live virus vaccines This medicine may also interact with the following medications:  inactivated vaccines This list may not describe all possible interactions. Give your health care provider a list of all the medicines, herbs, non-prescription drugs, or dietary supplements you use. Also tell them if you smoke, drink alcohol, or use illegal drugs. Some items may interact with your medicine. What should I watch for while using this medicine? Tell your doctor or health care professional if your symptoms do not start to get better or if they get worse. You will be tested for tuberculosis (TB) before you start this medicine. If your doctor prescribes any medicine for TB, you should start taking the TB medicine before starting this medicine. Make sure to finish the full course of TB medicine. Call your doctor or health care professional for advice if you get a fever, chills or sore throat, or other symptoms of a cold or flu. Do not treat yourself. This drug decreases your body's ability to fight infections. Try to avoid being around people who are sick. This medicine can decrease the response to a vaccine. If you need to get vaccinated, tell your health care professional if you have received this medicine. Extra booster doses may be needed. Talk to your doctor to see if a different vaccination schedule is needed. What side effects may I notice from receiving this medicine? Side effects that you should report to your doctor or health care   professional as soon as possible:  allergic reactions like skin rash; itching or hives; swelling of the face, lips, or tongue  sign and symptoms of an infection like fever or chills; cough; sore throat; pain or trouble passing urine  swollen lymph nodes in the neck, underarm, or groin areas  unusually weak or tired  breathing problems  unexplained weight loss Side  effects that usually do not require medical attention (report these to your doctor or health care professional if they continue or are bothersome):  headache  pain, redness, or irritation at site where injected  tiredness This list may not describe all possible side effects. Call your doctor for medical advice about side effects. You may report side effects to FDA at 1-800-FDA-1088. Where should I keep my medicine? Keep out of the reach of children. Store in a refrigerator between 2 and 8 degrees C (36 and 46 degrees F) in the original carton. Do not freeze. Keep in the original carton to protect from light. Throw away any unused medicine after the expiration date. NOTE: This sheet is a summary. It may not cover all possible information. If you have questions about this medicine, talk to your doctor, pharmacist, or health care provider.  2021 Elsevier/Gold Standard (2017-06-22 15:33:11)  

## 2020-04-23 NOTE — Progress Notes (Signed)
stage 4 kidney disease  Congestive heart failure

## 2020-04-25 LAB — COMPREHENSIVE METABOLIC PANEL
AG Ratio: 1 (calc) (ref 1.0–2.5)
ALT: 20 U/L (ref 6–29)
AST: 24 U/L (ref 10–35)
Albumin: 3.8 g/dL (ref 3.6–5.1)
Alkaline phosphatase (APISO): 149 U/L (ref 37–153)
BUN/Creatinine Ratio: 17 (calc) (ref 6–22)
BUN: 30 mg/dL — ABNORMAL HIGH (ref 7–25)
CO2: 17 mmol/L — ABNORMAL LOW (ref 20–32)
Calcium: 8.9 mg/dL (ref 8.6–10.4)
Chloride: 109 mmol/L (ref 98–110)
Creat: 1.76 mg/dL — ABNORMAL HIGH (ref 0.60–0.93)
Globulin: 4 g/dL (calc) — ABNORMAL HIGH (ref 1.9–3.7)
Glucose, Bld: 119 mg/dL (ref 65–139)
Potassium: 4.9 mmol/L (ref 3.5–5.3)
Sodium: 138 mmol/L (ref 135–146)
Total Bilirubin: 0.3 mg/dL (ref 0.2–1.2)
Total Protein: 7.8 g/dL (ref 6.1–8.1)

## 2020-04-25 LAB — QUANTIFERON-TB GOLD PLUS
Mitogen-NIL: 8.3 IU/mL
NIL: 0.05 IU/mL
QuantiFERON-TB Gold Plus: NEGATIVE
TB1-NIL: <0 IU/mL
TB2-NIL: <0 IU/mL

## 2020-04-25 LAB — CBC WITH DIFFERENTIAL/PLATELET
Absolute Monocytes: 858 cells/uL (ref 200–950)
Basophils Absolute: 51 cells/uL (ref 0–200)
Basophils Relative: 0.4 %
Eosinophils Absolute: 512 cells/uL — ABNORMAL HIGH (ref 15–500)
Eosinophils Relative: 4 %
HCT: 35.5 % (ref 35.0–45.0)
Hemoglobin: 11.7 g/dL (ref 11.7–15.5)
Lymphs Abs: 2112 cells/uL (ref 850–3900)
MCH: 29.8 pg (ref 27.0–33.0)
MCHC: 33 g/dL (ref 32.0–36.0)
MCV: 90.6 fL (ref 80.0–100.0)
MPV: 10.5 fL (ref 7.5–12.5)
Monocytes Relative: 6.7 %
Neutro Abs: 9267 cells/uL — ABNORMAL HIGH (ref 1500–7800)
Neutrophils Relative %: 72.4 %
Platelets: 285 10*3/uL (ref 140–400)
RBC: 3.92 10*6/uL (ref 3.80–5.10)
RDW: 13.9 % (ref 11.0–15.0)
Total Lymphocyte: 16.5 %
WBC: 12.8 10*3/uL — ABNORMAL HIGH (ref 3.8–10.8)

## 2020-04-25 LAB — ANA: Anti Nuclear Antibody (ANA): NEGATIVE

## 2020-04-25 LAB — HEPATITIS B CORE ANTIBODY, TOTAL: Hep B Core Total Ab: NONREACTIVE

## 2020-04-25 LAB — HEPATITIS C ANTIBODY
Hepatitis C Ab: NONREACTIVE
SIGNAL TO CUT-OFF: 0.02 (ref <1.00)

## 2020-04-25 LAB — HEPATITIS B SURFACE ANTIBODY, QUANTITATIVE: Hep B S AB Quant (Post): <5 m[IU]/mL — ABNORMAL LOW (ref >=10)

## 2020-04-25 LAB — HEPATITIS B SURFACE ANTIGEN: Hepatitis B Surface Ag: NONREACTIVE

## 2020-04-28 ENCOUNTER — Telehealth: Payer: Self-pay

## 2020-04-28 NOTE — Telephone Encounter (Signed)
Phone call to patient with her lab results. Voicemail left for patient to give the office a call back.  

## 2020-04-28 NOTE — Telephone Encounter (Signed)
-----   Message from Lavonna Monarch, MD sent at 04/25/2020  2:23 PM EST ----- Labs are okay to initiate biologic therapy.

## 2020-04-29 NOTE — Telephone Encounter (Signed)
Results to patient ok to start skyrizi per Dr Denna Haggard .

## 2020-04-29 NOTE — Telephone Encounter (Signed)
Patient is returning call about pathology results. 

## 2020-04-29 NOTE — Telephone Encounter (Signed)
Patient is returning call about results.  Patient state that she will be gone for a couple of hours, so should be back after 1:45 PM.

## 2020-05-03 ENCOUNTER — Encounter: Payer: Self-pay | Admitting: Dermatology

## 2020-05-03 NOTE — Progress Notes (Signed)
   Follow-Up Visit   Subjective  Kaitlin Berry is a 75 y.o. female who presents for the following: Psoriasis (Flared up on face, scalp, hands x 2+months. Currently no treatment. ).  Psoriasis Location: Spreading on scalp, face, hands. Duration:  Quality:  Associated Signs/Symptoms: Modifying Factors:  Severity:  Timing: Context:   Objective  Well appearing patient in no apparent distress; mood and affect are within normal limits. Objective  Chest - Medial [facility]), Left 2nd Finger Lateral Paronychium, Left Forearm - Anterior, Left Forehead, Mid Back (2), Mid Parietal Scalp, Right Forearm - Anterior, Right Forehead: Medium plaque psoriasis over perhaps 20% body surface area with surprisingly severe involvement of the head and neck.  No active synovitis or nail changes.  Images            A full examination was performed including scalp, head, eyes, ears, nose, lips, neck, chest, axillae, abdomen, back, buttocks, bilateral upper extremities, bilateral lower extremities, hands, feet, fingers, toes, fingernails, and toenails. All findings within normal limits unless otherwise noted below.  Areas beneath undergarments not examined.I, Lavonna Monarch, MD, have reviewed all documentation for this visit. The documentation on 05/03/20 for the exam, diagnosis, procedures, and orders are all accurate and complete.   Assessment & Plan    Psoriasis (9) Left Forearm - Anterior; Right Forearm - Anterior; Chest - Medial [facility]); Mid Back (2); Left 2nd Finger Lateral Paronychium; Mid Parietal Scalp; Left Forehead; Right Forehead  Detailed conversation about essentially all psoriasis treatment options with focus on modern systemic therapy.  Skyrizi new start baseline labs and cost permits.  Patient to contact us within 2 weeks regarding preauthorization.  History of heart failure so we will avoid TNF alpha blockers. check 2 months.  Other Related Procedures Comprehensive metabolic  panel CBC with Differential/Platelet Hepatitis B surface antibody,quantitative Hepatitis B surface antigen Hepatitis B core antibody, total Hepatitis C antibody QuantiFERON-TB Gold Plus ANA      I, Lavonna Monarch, MD, have reviewed all documentation for this visit.  The documentation on 05/03/20 for the exam, diagnosis, procedures, and orders are all accurate and complete.

## 2020-05-05 ENCOUNTER — Telehealth: Payer: Self-pay | Admitting: *Deleted

## 2020-05-05 NOTE — Telephone Encounter (Signed)
Prior Authorization for [employer] approved until 02/28/2021.

## 2020-05-07 NOTE — Telephone Encounter (Signed)
Skyrizi approved  03-01-2020-02-28-2021   PA # [id number]

## 2020-05-20 ENCOUNTER — Telehealth: Payer: Medicare HMO | Admitting: Cardiology

## 2020-06-09 ENCOUNTER — Telehealth: Payer: Self-pay | Admitting: *Deleted

## 2020-06-09 NOTE — Telephone Encounter (Signed)
Patient has agreed to been contacted and agreed to delivery date of 06/10/2020 for skyrizi.

## 2021-02-18 NOTE — Telephone Encounter (Signed)
Patient copay 977.59 per senderra and confirmed shipping

## 2021-04-30 ENCOUNTER — Other Ambulatory Visit: Payer: Self-pay | Admitting: Dermatology

## 2021-05-11 ENCOUNTER — Telehealth: Payer: Self-pay | Admitting: Cardiology

## 2021-05-11 NOTE — Telephone Encounter (Signed)
Not sure why she wants to change.  I do have a gentleman who I see his last name is Medical illustrator. ? ?I am okay seeing her, but since she has not been seen in person in over 2 years, she can be put in in a new patient slot to see me. ? ?Glenetta Hew, MD ? ?

## 2021-05-11 NOTE — Telephone Encounter (Signed)
Patient requesting to switch from Dr. Radford Pax to Dr. Ellyn Hack.  ?

## 2021-05-29 ENCOUNTER — Ambulatory Visit: Payer: Medicare HMO | Admitting: Cardiology

## 2021-05-29 ENCOUNTER — Encounter: Payer: Self-pay | Admitting: Cardiology

## 2021-05-29 VITALS — BP 146/78 | HR 74 | Ht 63.0 in | Wt 289.6 lb

## 2021-05-29 DIAGNOSIS — E1169 Type 2 diabetes mellitus with other specified complication: Secondary | ICD-10-CM

## 2021-05-29 DIAGNOSIS — E785 Hyperlipidemia, unspecified: Secondary | ICD-10-CM

## 2021-05-29 DIAGNOSIS — I10 Essential (primary) hypertension: Secondary | ICD-10-CM | POA: Diagnosis not present

## 2021-05-29 DIAGNOSIS — R0609 Other forms of dyspnea: Secondary | ICD-10-CM

## 2021-05-29 DIAGNOSIS — G4733 Obstructive sleep apnea (adult) (pediatric): Secondary | ICD-10-CM | POA: Diagnosis not present

## 2021-05-29 DIAGNOSIS — I5032 Chronic diastolic (congestive) heart failure: Secondary | ICD-10-CM | POA: Diagnosis not present

## 2021-05-29 DIAGNOSIS — N184 Chronic kidney disease, stage 4 (severe): Secondary | ICD-10-CM

## 2021-05-29 DIAGNOSIS — R072 Precordial pain: Secondary | ICD-10-CM

## 2021-05-29 MED ORDER — METOPROLOL TARTRATE 50 MG PO TABS: ORAL_TABLET | ORAL | 0 refills | Status: DC

## 2021-05-29 NOTE — Progress Notes (Signed)
? ? ?Primary Care Provider: Christain Sacramento, MD ?Cardiologist: Kaitlin Him, MD ?Electrophysiologist: None ?Nephrology: Dr. Gean Berry ? ?Clinic Note: ?Chief Complaint  ?Patient presents with  ? Follow-up  ?  Transferring care to a new cardiologist.  94-month ? Dyspnea on exertion  ? ?=================================== ? ?ASSESSMENT/PLAN  ? ?Problem List Items Addressed This Visit   ? ?  ? Cardiology Problems  ? Essential hypertension (Chronic)  ?  Blood pressure is high today.  She says she is very anxious.  I was confused that she is not taking carvedilol.  I think this is probably because she was lost to follow-up.  I am not sure who is managing her hypertension. ? ?We will reassess pressures and follow-up visit and also at her upcoming studies.  Low threshold to reinitiate carvedilol, but with creatinine issues, will hold off on ACE inhibitor or ARB.  Previously used BiDil. ?  ?  ? Relevant Medications  ? metoprolol tartrate (LOPRESSOR) 50 MG tablet  ? Other Relevant Orders  ? EKG 12-Lead  ? CT CORONARY MORPH W/CTA COR W/SCORE W/CA W/CM &/OR WO/CM  ? Basic metabolic panel  ? ECHOCARDIOGRAM COMPLETE  ? Hyperlipidemia associated with type 2 diabetes mellitus (HCC) (Chronic)  ?  No longer on statin.  She has not had lipids checked in a long time.  Pending results of Coronary CTA, may want to reassess and treat accordingly. ?  ?  ? Relevant Medications  ? metoprolol tartrate (LOPRESSOR) 50 MG tablet  ? Chronic diastolic heart failure (HCC) (Chronic)  ?  She has diagnosis of chronic diastolic heart failure.  I I think is probably a misnomer.  She has diastolic dysfunction and she had an episode of diastolic heart failure but has not have chronic heart failure symptoms until recently now with maybe an acute exacerbation.  Interestingly, she denies any PND orthopnea and no significant edema. ? ?Dyspnea could be multifactorial. ? ?Plan: We will recheck 2D echo to assess EF and wall motion.  Also diastolic  function. ?Without clear evidence of volume overload, would not start diuretic for now.  With her renal function, she would potentially benefit from [place] ?Anticipate restarting her back on carvedilol plus or minus BiDil or amlodipine for afterload reduction. ?  ?  ? Relevant Medications  ? metoprolol tartrate (LOPRESSOR) 50 MG tablet  ? Other Relevant Orders  ? EKG 12-Lead  ? CT CORONARY MORPH W/CTA COR W/SCORE W/CA W/CM &/OR WO/CM  ? Basic metabolic panel  ? ECHOCARDIOGRAM COMPLETE  ?  ? Other  ? Morbid obesity ([place]  ? Relevant Orders  ? EKG 12-Lead  ? CT CORONARY MORPH W/CTA COR W/SCORE W/CA W/CM &/OR WO/CM  ? Basic metabolic panel  ? ECHOCARDIOGRAM COMPLETE  ? DOE (dyspnea on exertion) - Primary  ?  This is her main presenting symptom.  Clearly multifactorial between diastolic dysfunction/heart failure, obesity, OSA, deconditioning. ? ?Check 2D echocardiogram and Coronary CTA to exclude cardiac etiology. ?  ?  ? Relevant Orders  ? EKG 12-Lead  ? CT CORONARY MORPH W/CTA COR W/SCORE W/CA W/CM &/OR WO/CM  ? Basic metabolic panel  ? ECHOCARDIOGRAM COMPLETE  ? Precordial pain  ?  Is really a tightness across her chest with dyspnea.  Somewhat atypical, but it is exertional and may be an anginal equivalent. ? ?She has had Myoview stress test in the past that were nonischemic, but due to body habitus, questionable the efficacy.  My initial plan is to do  a Coronary CTA, she would need hydration pre and post because of her renal insufficiency.  She will also need to be on the beta-blocker for the procedure. ? ?It may be best for her to come in an hour or so prior to the CT scan to get IV fluids.  I do think that Coronary CTA is a better test for her to truly evaluate coronary anatomy as a nuclear stress test may very well have artifact. ?  ?  ? Relevant Orders  ? CT CORONARY MORPH W/CTA COR W/SCORE W/CA W/CM &/OR WO/CM  ? Basic metabolic panel  ? ECHOCARDIOGRAM COMPLETE  ? OBSTRUCTIVE SLEEP APNEA  (Chronic)  ?  Clearly plays a role in her dyspnea.  She is using CPAP.  Assess pulmonary pressures with 2D echo. ?  ?  ? Relevant Orders  ? EKG 12-Lead  ? CT CORONARY MORPH W/CTA COR W/SCORE W/CA W/CM &/OR WO/CM  ? Basic metabolic panel  ? ECHOCARDIOGRAM COMPLETE  ? CKD (chronic kidney disease), stage IV (HCC) (Chronic)  ?  Most recent creatinine was 1.7. ? ?We will need to rehydrate for CT scan.  I am worried about contrast, but should be okay with pre and post hydration. ?  ?  ? ? ?=================================== ? ?HPI:   ? ?Kaitlin Berry is a morbidly obese 76 y.o. female with a PMH notable for DM, HTN, HLD, OSA-CPAP, reported CHF?  (Likely HFpEF), and chronic back pain (limiting walking) who presents today for evaluation of progression of exertional dyspnea. She is being seen today at the request of Kaitlin Sacramento, MD. ? ?Initial evaluation was in September 2018 for what appeared to be an episode of HFpEF-BNP 700.  Treated with IV Lasix.  She also had chest tightness.  Evaluated echo and Myoview that were normal.  Was also treated for hypothyroidism.  Chest pain thought to be noncardiac. ? ?Recent Hospitalizations:  ?June 1-6, 2021: Presented with worsening dyspnea and bilateral leg swelling.  BNP was 100 Multaq.  Treated with IV Lasix.  Normal EF.  Discharged on 60 mg Lasix.  She had a complication of acute on chronic renal failure.  Creatinine was 2.4 discharge.  Also had UTI. ? ?Kaitlin Berry was last seen on 11/20/2019 by Dr. Radford Berry via telemedicine for follow-up evaluation of her exertional dyspnea.  She denied any associated symptoms of chest pain/pressure, PND, orthopnea or edema. ?Work-up included 2D echocardiogram and Myoview:  ?Echo:  normal LV function with EF 60 to 65% and GR 1 DD.   ?Nuclear stress test showed no inducible ischemia.  ? At that time it was felt that her shortness of breath was multifactorial due to morbid obesity, sedentary lifestyle and diastolic CHF.  She has a strong  family history of coronary disease.   ?Was on 60 mg daily Lasix with additional 20 mg.  Fluid retention.  Recommended daily weights. ?Was on BiDil TID and carvedilol 6.25 mg twice daily along with Trulicity and Tresiba (Y3K was 11.4). ?Creatinine was 2.17 ? ? ?Reviewed  CV studies:   ? ?The following studies were reviewed today: (if available, images/films reviewed: From Epic Chart or Care Everywhere) ?TTE 11/29/2016: EF 60 to 65%.  Normal LV size with mild LVH.  No RWMA.  GR 1 DD with normal LA size.  Mild MAC.  Trivial MR.  Mildly calcified aortic leaflets (sclerosis with no stenosis).  Normal RV size and function.  Normal RVP and RAP. ?TTE 08/01/2019: EF 60 to 65%.  No  RWMA.  Normal LV size and function.  GR 1 DD.  Normal LA size.  No MR.  Mild aortic sclerosis but no stenosis.  Normal RV size and function.  Normal RAP.  Unable to assess PA.-Poor windows. ? ?Myoview 11/29/2016: No ischemia or infarction. ?VQ scan 08/01/2019: Normal perfusion scan.  No evidence of PE. ? ? ?Interval History:  ? ?Kaitlin Berry presents here today for reevaluation of dyspnea which is now becoming quite significant.  She gets short of breath just walking down the hall to the bathroom.  She has to stop and stand there to catch her breath.  She denies any associated chest pain or pressure.  But really doing spent any mild or modest amount of activity will make her extremely short of breath.  She denies any real PND orthopnea. ?About 3 weeks ago she had an episode where she had a significant exacerbation, unable to catch her breath just walking down the hallway.  She had to sit down and catch her breath.  She felt lightheaded and dizzy.  Very anxious. ? ?She uses CPAP so is difficult to tell if she has PND orthopnea.  Not really noting any significant edema.  She is no longer on diuretic, in fact she is not taking her carvedilol or any other of her previous cardiac medications. ? ?She has rare palpitations but no prolonged episodes. ? ?CV  Review of Symptoms (Summary) ?Cardiovascular ROS: positive for - edema, paroxysmal nocturnal dyspnea, rapid heart rate, and and some tightness in her chest with dyspnea, but is across the top of her chest and not necessaril

## 2021-05-29 NOTE — Patient Instructions (Addendum)
Medication Instructions:  ?Continue same medication ?*If you need a refill on your cardiac medications before your next appointment, please call your pharmacy* ? ? ?Lab Work: ?Bmet today ? ? ?Testing/Procedures: ?Coronary CT will be scheduled after approved by insurance     Follow instructions below ? ? ?Schedule Echo ? ? ? ? ?Follow-Up: ?At [facility], you and your health needs are our priority.  As part of our continuing mission to provide you with exceptional heart care, we have created designated Provider Care Teams.  These Care Teams include your primary Cardiologist (physician) and Advanced Practice Providers (APPs -  Physician Assistants and Nurse Practitioners) who all work together to provide you with the care you need, when you need it. ? ?We recommend signing up for the patient portal called "MyChart".  Sign up information is provided on this After Visit Summary.  MyChart is used to connect with patients for Virtual Visits (Telemedicine).  Patients are able to view lab/test results, encounter notes, upcoming appointments, etc.  Non-urgent messages can be sent to your provider as well.   ?To learn more about what you can do with MyChart, go to [email or web address].   ? ?Your next appointment:  After test ?  ? ?The format for your next appointment: Office ? ? ?Provider:  Dr.Harding ? ? ? ? ?Your cardiac CT will be scheduled at one of the below locations:  ? ?[facility] ?[address] ?[place], [place] [address] ?[phone] ? ?OR ? ?[place] [address] ?[place], [place] [address] ?[phone] ? ?If scheduled at [facility], please arrive at the [facility] and [facility] Entrance (Entrance C2) of [facility] 30 minutes prior to test start time. ?You can use the FREE valet parking offered at entrance C (encouraged to control the heart rate for the test)  ?Proceed to the [facility] Radiology Department (first floor) to  check-in and test prep. ? ?All radiology patients and guests should use entrance C2 at [facility], accessed from [facility], even though the hospital's physical address listed is [address]. ? ? ? ?If scheduled at [facility], please arrive 15 mins early for check-in and test prep. ? ?Please follow these instructions carefully (unless otherwise directed): ? ? ? ?On the Night Before the Test: ?Be sure to Drink plenty of water. ?Do not consume any caffeinated/decaffeinated beverages or chocolate 12 hours prior to your test. ?Do not take any antihistamines 12 hours prior to your test. ? ? ?On the Day of the Test: ?Drink plenty of water until 1 hour prior to the test. ?Do not eat any food 4 hours prior to the test. ?You may take your regular medications prior to the test.  ?Take metoprolol 50 mg two hours prior to test. ?FEMALES- please wear underwire-free bra if available, avoid dresses & tight clothing ? ? ?     ?After the Test: ?Drink plenty of water. ?After receiving IV contrast, you may experience a mild flushed feeling. This is normal. ?On occasion, you may experience a mild rash up to 24 hours after the test. This is not dangerous. If this occurs, you can take Benadryl 25 mg and increase your fluid intake. ?If you experience trouble breathing, this can be serious. If it is severe call 911 IMMEDIATELY. If it is mild, please call our office. ?If you take any of these medications: Glipizide/Metformin, Avandament, Glucavance, please do not take 48 hours after  completing test unless otherwise instructed. ? ?We will call to schedule your test 2-4 weeks out understanding that some insurance companies will need an authorization prior to the service being performed.  ? ?For non-scheduling related questions, please contact the cardiac imaging nurse navigator should you have any questions/concerns: ?Marchia Bond, Cardiac Imaging Nurse Navigator ?Gordy Clement,  Cardiac Imaging Nurse Navigator ?[facility] ?Direct Office Dial: [phone]  ? ?For scheduling needs, including cancellations and rescheduling, please call [place], [phone]. ? ? ?

## 2021-05-30 ENCOUNTER — Encounter: Payer: Self-pay | Admitting: Cardiology

## 2021-05-30 DIAGNOSIS — E1169 Type 2 diabetes mellitus with other specified complication: Secondary | ICD-10-CM | POA: Insufficient documentation

## 2021-05-30 LAB — BASIC METABOLIC PANEL
BUN/Creatinine Ratio: 20 (ref 12–28)
BUN: 33 mg/dL — ABNORMAL HIGH (ref 8–27)
CO2: 17 mmol/L — ABNORMAL LOW (ref 20–29)
Calcium: 8.7 mg/dL (ref 8.7–10.3)
Chloride: 105 mmol/L (ref 96–106)
Creatinine, Ser: 1.65 mg/dL — ABNORMAL HIGH (ref 0.57–1.00)
Glucose: 147 mg/dL — ABNORMAL HIGH (ref 70–99)
Potassium: 5.5 mmol/L — ABNORMAL HIGH (ref 3.5–5.2)
Sodium: 138 mmol/L (ref 134–144)
eGFR: 32 mL/min/{1.73_m2} — ABNORMAL LOW (ref >59)

## 2021-05-30 NOTE — Assessment & Plan Note (Signed)
Most recent creatinine was 1.7. ? ?We will need to rehydrate for CT scan.  I am worried about contrast, but should be okay with pre and post hydration. ?

## 2021-05-30 NOTE — Assessment & Plan Note (Addendum)
She has diagnosis of chronic diastolic heart failure.  I I think is probably a misnomer.  She has diastolic dysfunction and she had an episode of diastolic heart failure but has not have chronic heart failure symptoms until recently now with maybe an acute exacerbation.  Interestingly, she denies any PND orthopnea and no significant edema. ? ?Dyspnea could be multifactorial. ? ?Plan: We will recheck 2D echo to assess EF and wall motion.  Also diastolic function. ?? Without clear evidence of volume overload, would not start diuretic for now.  With her renal function, she would potentially benefit from [place]. ?? Anticipate restarting her back on carvedilol plus or minus BiDil or amlodipine for afterload reduction. ?

## 2021-05-30 NOTE — Assessment & Plan Note (Signed)
Blood pressure is high today.  She says she is very anxious.  I was confused that she is not taking carvedilol.  I think this is probably because she was lost to follow-up.  I am not sure who is managing her hypertension. ? ?We will reassess pressures and follow-up visit and also at her upcoming studies.  Low threshold to reinitiate carvedilol, but with creatinine issues, will hold off on ACE inhibitor or ARB.  Previously used BiDil. ?

## 2021-05-30 NOTE — Assessment & Plan Note (Signed)
Clearly plays a role in her dyspnea.  She is using CPAP.  Assess pulmonary pressures with 2D echo. ?

## 2021-05-30 NOTE — Assessment & Plan Note (Signed)
Is really a tightness across her chest with dyspnea.  Somewhat atypical, but it is exertional and may be an anginal equivalent. ? ?She has had Myoview stress test in the past that were nonischemic, but due to body habitus, questionable the efficacy.  My initial plan is to do a Coronary CTA, she would need hydration pre and post because of her renal insufficiency.  She will also need to be on the beta-blocker for the procedure. ? ?It may be best for her to come in an hour or so prior to the CT scan to get IV fluids.  I do think that Coronary CTA is a better test for her to truly evaluate coronary anatomy as a nuclear stress test may very well have artifact. ?

## 2021-05-30 NOTE — Assessment & Plan Note (Signed)
No longer on statin.  She has not had lipids checked in a long time.  Pending results of Coronary CTA, may want to reassess and treat accordingly. ?

## 2021-05-30 NOTE — Assessment & Plan Note (Signed)
This is her main presenting symptom.  Clearly multifactorial between diastolic dysfunction/heart failure, obesity, OSA, deconditioning. ? ?Check 2D echocardiogram and Coronary CTA to exclude cardiac etiology. ?

## 2021-06-01 NOTE — Telephone Encounter (Signed)
Ok sounds good

## 2021-06-02 ENCOUNTER — Other Ambulatory Visit: Payer: Self-pay | Admitting: Dermatology

## 2021-06-11 ENCOUNTER — Telehealth (HOSPITAL_COMMUNITY): Payer: Self-pay | Admitting: *Deleted

## 2021-06-11 NOTE — Telephone Encounter (Signed)
Reaching out to patient to offer assistance regarding upcoming cardiac imaging study; pt verbalizes understanding of appt date/time, parking situation and where to check in, pre-test NPO status and medications ordered, name and call back number provided for further questions should they arise ? ?Gordy Clement RN Navigator Cardiac Imaging ?[facility] ?[phone] office ?[phone] cell ? ?Patient aware to arrive at 12:30pm for IV hydration.  She will take '50mg'$  metoprolol tartrate two hours prior to her cardiac CT scan. ?

## 2021-06-11 NOTE — Telephone Encounter (Signed)
Attempted to call patient regarding upcoming cardiac CT appointment. °Left message on voicemail with name and callback number ° °Dotsie Gillette RN Navigator Cardiac Imaging °[facility] °[phone] Office °[phone] Cell ° °

## 2021-06-12 ENCOUNTER — Encounter (HOSPITAL_COMMUNITY)
Admission: RE | Admit: 2021-06-12 | Discharge: 2021-06-12 | Disposition: A | Discharge status: Home / Self Care | Payer: Medicare HMO | Source: Ambulatory Visit | Attending: Family Medicine | Admitting: Family Medicine

## 2021-06-12 ENCOUNTER — Ambulatory Visit (HOSPITAL_COMMUNITY)
Admission: RE | Admit: 2021-06-12 | Discharge: 2021-06-12 | Disposition: A | Discharge status: Home / Self Care | Payer: Medicare HMO | Source: Ambulatory Visit | Attending: Cardiology | Admitting: Cardiology

## 2021-06-12 VITALS — BP 151/63 | HR 87

## 2021-06-12 DIAGNOSIS — G4733 Obstructive sleep apnea (adult) (pediatric): Secondary | ICD-10-CM | POA: Insufficient documentation

## 2021-06-12 DIAGNOSIS — I10 Essential (primary) hypertension: Secondary | ICD-10-CM | POA: Diagnosis present

## 2021-06-12 DIAGNOSIS — R0609 Other forms of dyspnea: Secondary | ICD-10-CM | POA: Insufficient documentation

## 2021-06-12 DIAGNOSIS — N184 Chronic kidney disease, stage 4 (severe): Secondary | ICD-10-CM | POA: Diagnosis present

## 2021-06-12 DIAGNOSIS — I5032 Chronic diastolic (congestive) heart failure: Secondary | ICD-10-CM | POA: Insufficient documentation

## 2021-06-12 DIAGNOSIS — R072 Precordial pain: Secondary | ICD-10-CM | POA: Insufficient documentation

## 2021-06-12 HISTORY — PX: CT CTA CORONARY W/CA SCORE W/CM &/OR WO/CM: HXRAD787

## 2021-06-12 LAB — BASIC METABOLIC PANEL
Anion gap: 6 (ref 5–15)
BUN: 30 mg/dL — ABNORMAL HIGH (ref 8–23)
CO2: 20 mmol/L — ABNORMAL LOW (ref 22–32)
Calcium: 8.3 mg/dL — ABNORMAL LOW (ref 8.9–10.3)
Chloride: 111 mmol/L (ref 98–111)
Creatinine, Ser: 1.88 mg/dL — ABNORMAL HIGH (ref 0.44–1.00)
GFR, Estimated: 28 mL/min — ABNORMAL LOW (ref >60)
Glucose, Bld: 129 mg/dL — ABNORMAL HIGH (ref 70–99)
Potassium: 4.2 mmol/L (ref 3.5–5.1)
Sodium: 137 mmol/L (ref 135–145)

## 2021-06-12 MED ORDER — SODIUM CHLORIDE 0.9 % WEIGHT BASED INFUSION
3.0000 mL/kg/h | INTRAVENOUS | Status: AC
  Administered 2021-06-12: 2.998 mL/kg/h via INTRAVENOUS

## 2021-06-12 MED ORDER — DILTIAZEM HCL 25 MG/5ML IV SOLN
INTRAVENOUS | Status: AC
  Administered 2021-06-12: 5 mg via INTRAVENOUS
  Filled 2021-06-12: qty 5

## 2021-06-12 MED ORDER — NITROGLYCERIN 0.4 MG SL SUBL: 0.8000 mg | SUBLINGUAL_TABLET | Freq: Once | SUBLINGUAL | Status: AC

## 2021-06-12 MED ORDER — DILTIAZEM HCL 25 MG/5ML IV SOLN: 5.0000 mg | Freq: Once | INTRAVENOUS | Status: AC

## 2021-06-12 MED ORDER — SODIUM CHLORIDE 0.9 % WEIGHT BASED INFUSION: 1.0000 mL/kg/h | INTRAVENOUS | Status: AC

## 2021-06-12 MED ORDER — NITROGLYCERIN 0.4 MG SL SUBL
SUBLINGUAL_TABLET | SUBLINGUAL | Status: AC
  Administered 2021-06-12: 0.8 mg via SUBLINGUAL
  Filled 2021-06-12: qty 2

## 2021-06-12 MED ORDER — IOHEXOL 350 MG/ML SOLN
100.0000 mL | Freq: Once | INTRAVENOUS | Status: AC | PRN
  Administered 2021-06-12: 100 mL via INTRAVENOUS

## 2021-06-12 NOTE — Progress Notes (Signed)
CT notified that pt's IV fluids started at 1310.   Also confirmed CT time as pt needs to take Metoprolol '50mg'$  per MD 2 hours prior to CT.  CT staff confirmed 1530 start for CT.  Pt instructed to take med at 1330 ?

## 2021-06-16 ENCOUNTER — Ambulatory Visit (HOSPITAL_COMMUNITY): Payer: Medicare HMO | Attending: Cardiology

## 2021-06-16 DIAGNOSIS — R0609 Other forms of dyspnea: Secondary | ICD-10-CM | POA: Insufficient documentation

## 2021-06-16 DIAGNOSIS — I10 Essential (primary) hypertension: Secondary | ICD-10-CM | POA: Insufficient documentation

## 2021-06-16 DIAGNOSIS — G4733 Obstructive sleep apnea (adult) (pediatric): Secondary | ICD-10-CM | POA: Diagnosis present

## 2021-06-16 DIAGNOSIS — R072 Precordial pain: Secondary | ICD-10-CM | POA: Diagnosis present

## 2021-06-16 DIAGNOSIS — I5032 Chronic diastolic (congestive) heart failure: Secondary | ICD-10-CM

## 2021-06-16 HISTORY — PX: TRANSTHORACIC ECHOCARDIOGRAM: SHX275

## 2021-06-16 LAB — ECHOCARDIOGRAM COMPLETE
AR max vel: 2.13 cm2
AV Area VTI: 2.49 cm2
AV Area mean vel: 1.8 cm2
AV Mean grad: 10 mmHg
AV Peak grad: 16.8 mmHg
Ao pk vel: 2.05 m/s
Area-P 1/2: 5.42 cm2
S' Lateral: 1.9 cm

## 2021-06-17 ENCOUNTER — Telehealth: Payer: Self-pay | Admitting: *Deleted

## 2021-06-17 NOTE — Telephone Encounter (Signed)
-----   Message from Leonie Man, MD sent at 06/17/2021  3:29 PM EDT ----- ?Echocardiogram: ?Hyperdynamic left ventricular function of 70 to 75%.  Not unexpectedly, impaired relaxation with mild left atrial dilation..  No wall motion abnormalities.  Essentially normal valves. ? ?Overall pretty normal. ? ?Glenetta Hew, MD ? ?

## 2021-06-17 NOTE — Telephone Encounter (Signed)
Left message on cellphone voicemail - informing patient result are in mychart--  may call to office for more detail.  Tried to call home number - just a clicking sound heard. ?

## 2021-07-02 ENCOUNTER — Encounter: Payer: Self-pay | Admitting: Dermatology

## 2021-07-02 ENCOUNTER — Ambulatory Visit: Payer: Medicare HMO | Admitting: Dermatology

## 2021-07-02 DIAGNOSIS — R5383 Other fatigue: Secondary | ICD-10-CM

## 2021-07-02 DIAGNOSIS — L409 Psoriasis, unspecified: Secondary | ICD-10-CM | POA: Diagnosis not present

## 2021-07-02 DIAGNOSIS — Z79899 Other long term (current) drug therapy: Secondary | ICD-10-CM

## 2021-07-02 DIAGNOSIS — L400 Psoriasis vulgaris: Secondary | ICD-10-CM | POA: Diagnosis not present

## 2021-07-02 MED ORDER — TRIAMCINOLONE ACETONIDE 0.1 % EX CREA: 1.0000 "application " | TOPICAL_CREAM | Freq: Every day | CUTANEOUS | 10 refills | Status: DC

## 2021-07-04 LAB — QUANTIFERON-TB GOLD PLUS
Mitogen-NIL: >10 IU/mL
NIL: 0.05 IU/mL
QuantiFERON-TB Gold Plus: NEGATIVE
TB1-NIL: 0 IU/mL
TB2-NIL: 0.01 IU/mL

## 2021-07-06 ENCOUNTER — Encounter: Payer: Self-pay | Admitting: Cardiology

## 2021-07-06 ENCOUNTER — Ambulatory Visit: Payer: Medicare HMO | Admitting: Cardiology

## 2021-07-06 VITALS — BP 150/80 | HR 78 | Ht 63.0 in | Wt 291.2 lb

## 2021-07-06 DIAGNOSIS — G4733 Obstructive sleep apnea (adult) (pediatric): Secondary | ICD-10-CM | POA: Diagnosis not present

## 2021-07-06 DIAGNOSIS — I10 Essential (primary) hypertension: Secondary | ICD-10-CM

## 2021-07-06 DIAGNOSIS — E1169 Type 2 diabetes mellitus with other specified complication: Secondary | ICD-10-CM

## 2021-07-06 DIAGNOSIS — I5032 Chronic diastolic (congestive) heart failure: Secondary | ICD-10-CM | POA: Diagnosis not present

## 2021-07-06 DIAGNOSIS — E785 Hyperlipidemia, unspecified: Secondary | ICD-10-CM

## 2021-07-06 DIAGNOSIS — N183 Chronic kidney disease, stage 3 unspecified: Secondary | ICD-10-CM

## 2021-07-06 MED ORDER — CARVEDILOL 3.125 MG PO TABS: 6.2500 mg | ORAL_TABLET | Freq: Two times a day (BID) | ORAL | 3 refills | Status: DC

## 2021-07-06 NOTE — Patient Instructions (Addendum)
Medication Instructions:  ? ? Start coreg - carvedilol 6.25 mg  twice  a day   ( 3.125 tablets given) ?Follow these instructions ?3.25 mg  one tablet twice a day  for 2 weeks then  ?    Continue  3.25 mg in the morning and increase the evening dose  to  (2 tablets= 6.25 mg)  for 2 weeks then ? Continue and increase the morning dose  (2 tablets of 3.125 = 6.25 mg) and the same for the evening.  ? ?Continue until your appointment with pharmacist. ? ? ? ?*If you need a refill on your cardiac medications before your next appointment, please call your pharmacy* ? ? ?Lab Work: ? ?Not needed ? ? ?Testing/Procedures: ?Not needed ? ? ?Follow-Up: ?At [facility], you and your health needs are our priority.  As part of our continuing mission to provide you with exceptional heart care, we have created designated Provider Care Teams.  These Care Teams include your primary Cardiologist (physician) and Advanced Practice Providers (APPs -  Physician Assistants and Nurse Practitioners) who all work together to provide you with the care you need, when you need it. ? ?  ? ?Your next appointment:   ? 6 to 7 month(s) ? ?The format for your next appointment:   ?In Person ? ?Provider:   ?Glenetta Hew, MD  ? ?You have been referred to  [facility]  - 6 to 8 weeks  blood pressure ? ?Other Instructions  ?

## 2021-07-06 NOTE — Progress Notes (Signed)
? ? ?Primary Care Provider: Christain Sacramento, MD ?Cardiologist: Glenetta Hew, MD ?Electrophysiologist: None ?Nephrology: Dr. Gean Quint ? ?Clinic Note: ?Chief Complaint  ?Patient presents with  ? Follow-up  ?  Echocardiogram and Coronary CTA results.  ? ?=================================== ? ?ASSESSMENT/PLAN  ? ?Problem List Items Addressed This Visit   ? ?  ? Cardiology Problems  ? Essential hypertension - Primary (Chronic)  ?  Blood pressure is still high she is actually not taking carvedilol. ? ?Plan: ?Restart carvedilol at 3.125 mg twice daily and then titrate up to 6.25 mg a.m. -and 3.25 mg daily after 1 month.  If tolerated then go to 6.25 mg twice daily. ?Follow-up with [facility] clinical pharmacist hypertension clinic in 6 to 8 weeks. ?We will need to have at a minimum chemistry panel done at that time.  But would be nice to have potassium lipid panel and A1c checked ?  ?  ? Relevant Medications  ? carvedilol (COREG) 3.125 MG tablet  ? Hyperlipidemia associated with type 2 diabetes mellitus (HCC) (Chronic)  ?  Coronary Calcium Score was 100 with no significant CAD noted on CT scan.  Just mild disease.  Should target LDL less than 100 given the presence of CAD.. ?. ?PCP is ostensibly following her labs.  I just do not have any labs to review.  If not drawn by the time she returns to [facility], we can have them drawn at that point.   ?Would like to have fasting lipids, CMP and A1c. ?  ?  ? Relevant Medications  ? carvedilol (COREG) 3.125 MG tablet  ? Chronic diastolic heart failure (HCC) (Chronic)  ?  She has a diagnosis of chronic diastolic heart failure and is hypertensive.  I think this is just 1 small component in her dyspnea.  As such, we should we will consider treating hypertension.  She does not really seem to have a need for diuretic at this time.  She is already on losartan 50 mg which can probably be titrated up, but would need to reassess chemistry panel first. ? ?We  are restarting carvedilol with plans to titrate up to 6.25 mg twice daily after 6 weeks. ? ?Pending chemistry panel evaluation and blood pressure and follow-up, can probably titrate losartan further and potentially add spironolactone. ? ?  ?  ? Relevant Medications  ? carvedilol (COREG) 3.125 MG tablet  ?  ? Other  ? CKD (chronic kidney disease), stage III (HCC) (Chronic)  ?  Her creatinine has been 1.7-1.9.  We will need to reassess in order to know how to best titrate up her medications. ?Also need to reassess after coronary CT angiogram. ? ?Plan: Check CMP at [facility] follow-up.  (If she has not eaten, can check FLP and A1c) ? ?  ?  ? Morbid obesity ([place])  ? OBSTRUCTIVE SLEEP APNEA (Chronic)  ? ?=================================== ? ?HPI:   ? ?Kaitlin Berry is a morbidly obese 76 y.o. female with a PMH notable for DM, HTN, HLD, OSA-CPAP, reported CHF?  (June 2021-diagnosed with HFpEF-started on IV Lasix), strong family history of CAD, and chronic back pain (limiting walking) who presents today for 6-week follow-up evaluation for progressive exertional dyspnea.  She is being seen today at the request of Christain Sacramento, MD > To review the results of her cardiovascular studies. ? ?Kaitlin Berry had previously been seen by Dr. Audie Clear her last visit on November 20, 2019 via telemedicine.  Had no significant symptoms of chest pain/pressure,  PND, orthopnea or edema.  The work-up to that point for HFpEF included a relatively normal 2D echocardiogram and Myoview. ? At that time it was felt that her shortness of breath was multifactorial due to morbid obesity, sedentary lifestyle and diastolic CHF.   ?Was on 60 mg daily Lasix with additional 20 mg.  Fluid retention.  Recommended daily weights. ?Was on BiDil TID and carvedilol 6.25 mg twice daily along with Trulicity and Tresiba (Z6X was 11.4). ?Creatinine was 2.17 ? ?Kaitlin Berry was seen for relook consultation back in March 2023.(She has transferred care to me-Dr.  Ellyn Hack, because I see her husband).  This is to reevaluate dyspnea which is becoming more significant.  Dyspnea walking in the hallway to the bathroom having to stop to catch her breath.  No associated chest pain or pressure.  Simple activity makes her profoundly dyspneic.  No PND or orthopnea, but difficult to tell because she uses CPAP.  No real edema.  No longer on diuretic, and not taking carvedilol or other cardiac medications. ?About 3 weeks prior to that visit, she had an episode where she had a significant exacerbation, unable to catch her breath just walking down the hallway.  She had to sit down and catch her breath.  She felt lightheaded and dizzy.  Very anxious. ?With progression of symptoms, definitive evaluation plan: Repeat 2D echocardiogram and Coronary CTA ? ?Recent Hospitalizations:  ?None ? ? ?Reviewed  CV studies:   ? ?The following studies were reviewed today: (if available, images/films reviewed: From Epic Chart or Care Everywhere) ? ?Coronary CTA 06/12/2021: Coronary Calcium Score 100.  Minimal nonobstructive CAD (1 to 24%).  Consider nonatherosclerotic cause of chest pain or dyspnea.  Normal pulmonary vein drainage.  Normal left atrial appendage with no thrombus.  No significant noncardiac findings. ? ?TTE 06/16/2021: EF 70 to 75%.  Hyperdynamic LV with no RWMA.  Moderate LVH.  GR 1 DD.  Mild LA dilation.  Normal valves. ? ? ?Interval History:  ? ?Kaitlin Berry presents here today pretty much feel the same way.  She does drink a lot of waterAnd has swelling but mostly in her hands and feet more so in her hands and feet.  She still uses CPAP so is difficult to tell if PND or orthopnea.  She still has notable immobility and deconditioning.  She is currently in a wheelchair.  Clinic visit.  She is morbidly obese and quite sedentary.  She does not do much activity and when she does she gets short of breath. ? ?CV Review of Symptoms (Summary) ?Cardiovascular ROS: positive for - edema, rapid  heart rate, and and some tightness in her chest with dyspnea, but is across the top of her chest and not necessarily localized on one side ?negative for - dyspnea on exertion, orthopnea, palpitations, paroxysmal nocturnal dyspnea, shortness of breath, or lightheadedness, dizziness or wooziness, syncope/near syncope or TIA amaurosis fugax, claudication. ?Difficult to assess PND and orthopnea because of CPAP. ? ?REVIEWED OF SYSTEMS  ? ?Review of Systems  ?Constitutional:  Positive for malaise/fatigue. Negative for weight loss.  ?Respiratory:  Positive for shortness of breath.   ?Cardiovascular:   ?     Per HPI  ?Musculoskeletal:  Positive for joint pain. Myalgias: Some cramps. ?Neurological:  Positive for dizziness (Only sometimes when she stands up quickly) and weakness (Generalized).  ?Psychiatric/Behavioral:  The patient is nervous/anxious.   ? ?I have reviewed and (if needed) personally updated the patient's problem list, medications, allergies, past medical  and surgical history, social and family history.  ? ?PAST MEDICAL HISTORY  ? ?Past Medical History:  ?Diagnosis Date  ? Anxiety   ? Arthritis   ? rt knee  ? BACK PAIN 03/24/2007  ? Qualifier: Diagnosis of  By: Gwenette Greet MD, Armando Reichert   ? Chronic back pain   ? DEPRESSION 03/24/2007  ? Qualifier: Diagnosis of  By: Gwenette Greet MD, Armando Reichert   ? Diabetes mellitus without complication, with long-term current use of insulin ([place])   ? GERD (gastroesophageal reflux disease)   ? Hyperlipidemia   ? HYPERSOMNIA 03/24/2007  ? Qualifier: Diagnosis of  By: Gwenette Greet MD, Armando Reichert   ? Hypertension   ? Morbid obesity with BMI of 45.0-49.9, adult ([place])   ? OBSTRUCTIVE SLEEP APNEA 05/17/2007  ? severe with AHI 35.1 on PSG 2009 - on CPAP  ? Psoriasis   ? Urticaria 11/27/2011  ? ? ?PAST SURGICAL HISTORY  ? ?Past Surgical History:  ?Procedure Laterality Date  ? ABDOMINAL HYSTERECTOMY    ? BACK SURGERY    ? lumbar  ? BREAST LUMPECTOMY WITH RADIOACTIVE SEED LOCALIZATION Right 10/01/2014  ?  Procedure: RIGHT BREAST LUMPECTOMY WITH RADIOACTIVE SEED LOCALIZATION;  Surgeon: Donnie Mesa, MD;  Location: [facility];  Service: General;  Laterality: Right;  ? CHOLECYSTECTOMY    ? CT CTA CORONARY

## 2021-07-12 ENCOUNTER — Encounter: Payer: Self-pay | Admitting: Cardiology

## 2021-07-12 NOTE — Assessment & Plan Note (Signed)
Blood pressure is still high she is actually not taking carvedilol. ? ?Plan: ?? Restart carvedilol at 3.125 mg twice daily and then titrate up to 6.25 mg a.m. -and 3.25 mg daily after 1 month.  If tolerated then go to 6.25 mg twice daily. ?? Follow-up with [facility] clinical pharmacist hypertension clinic in 6 to 8 weeks. ?? We will need to have at a minimum chemistry panel done at that time.  But would be nice to have potassium lipid panel and A1c checked ?

## 2021-07-12 NOTE — Assessment & Plan Note (Signed)
Her creatinine has been 1.7-1.9.  We will need to reassess in order to know how to best titrate up her medications. ?Also need to reassess after coronary CT angiogram. ? ?Plan: Check CMP at [facility] follow-up.  (If she has not eaten, can check FLP and A1c) ?

## 2021-07-12 NOTE — Assessment & Plan Note (Signed)
Coronary Calcium Score was 100 with no significant CAD noted on CT scan.  Just mild disease.  Should target LDL less than 100 given the presence of CAD.. ?. ?PCP is ostensibly following her labs.  I just do not have any labs to review.  If not drawn by the time she returns to [facility], we can have them drawn at that point.   ?? Would like to have fasting lipids, CMP and A1c. ?

## 2021-07-12 NOTE — Assessment & Plan Note (Signed)
She has a diagnosis of chronic diastolic heart failure and is hypertensive.  I think this is just 1 small component in her dyspnea.  As such, we should we will consider treating hypertension.  She does not really seem to have a need for diuretic at this time.  She is already on losartan 50 mg which can probably be titrated up, but would need to reassess chemistry panel first. ? ?We are restarting carvedilol with plans to titrate up to 6.25 mg twice daily after 6 weeks. ? ?Pending chemistry panel evaluation and blood pressure and follow-up, can probably titrate losartan further and potentially add spironolactone. ?

## 2021-07-13 ENCOUNTER — Telehealth: Payer: Self-pay | Admitting: Pharmacist

## 2021-07-13 DIAGNOSIS — E1169 Type 2 diabetes mellitus with other specified complication: Secondary | ICD-10-CM

## 2021-07-13 DIAGNOSIS — I5032 Chronic diastolic (congestive) heart failure: Secondary | ICD-10-CM

## 2021-07-13 NOTE — Telephone Encounter (Signed)
Placing lab orders per Dr Ellyn Hack ?

## 2021-07-20 ENCOUNTER — Encounter: Payer: Self-pay | Admitting: Dermatology

## 2021-07-20 NOTE — Progress Notes (Signed)
   Follow-Up Visit   Subjective  Kaitlin Berry is a 76 y.o. female who presents for the following: Psoriasis (Flare on the back and under breast she is due for TB test).  Psoriasis, minor flare on Skyrizi Location:  Duration:  Quality:  Associated Signs/Symptoms: Modifying Factors:  Severity:  Timing: Context:   Objective  Well appearing patient in no apparent distress; mood and affect are within normal limits. Mid Back Generally stays over 90% clear on Skyrizi, no significant joint symptoms.  Current flare under the breast and on the back with thin psoriasiform plaques.  No objective synovitis.    All skin waist up examined.  Joints and arm and hand examined.  Areas beneath undergarments not fully examined.   Assessment & Plan    Psoriasis  Related Procedures QuantiFERON-TB Gold Plus  Tired  Related Procedures QuantiFERON-TB Gold Plus  Encounter for long-term (current) use of medications  Related Procedures QuantiFERON-TB Gold Plus  Plaque psoriasis Mid Back  We discussed switching to an alternative biologic for now we will continue with Skyrizi and add topical triamcinolone.  Continue the skyrizi add topical for break through.  Update TB status.  triamcinolone cream (KENALOG) 0.1 % - Mid Back Apply 1 application. topically daily.      I, Lavonna Monarch, MD, have reviewed all documentation for this visit.  The documentation on 07/20/21 for the exam, diagnosis, procedures, and orders are all accurate and complete.

## 2021-08-03 ENCOUNTER — Telehealth: Payer: Self-pay

## 2021-08-03 NOTE — Telephone Encounter (Signed)
-----   Message from Rollen Sox, [facility] sent at 07/13/2021  7:01 AM EDT ----- Regarding: labs Please call patient to remind to get labs if she has not already

## 2021-08-03 NOTE — Telephone Encounter (Signed)
Lmom to complete fasting labs asap will route to myself to continue calling a couple more times.

## 2021-08-04 ENCOUNTER — Other Ambulatory Visit: Payer: Self-pay | Admitting: Dermatology

## 2021-08-04 NOTE — Telephone Encounter (Signed)
2nd Lmom to complete labs

## 2021-08-05 ENCOUNTER — Telehealth: Payer: Self-pay | Admitting: *Deleted

## 2021-08-05 NOTE — Telephone Encounter (Signed)
Fax from [employer] stating skyrizi  has agreed to delivery date of 08/05/21.

## 2021-08-05 NOTE — Telephone Encounter (Signed)
Called and finally spoke to pt who stated that they plan to complete fasting labs tomorrow

## 2021-08-13 ENCOUNTER — Other Ambulatory Visit: Payer: Self-pay

## 2021-08-13 DIAGNOSIS — I5032 Chronic diastolic (congestive) heart failure: Secondary | ICD-10-CM

## 2021-08-13 DIAGNOSIS — E1169 Type 2 diabetes mellitus with other specified complication: Secondary | ICD-10-CM

## 2021-08-14 LAB — COMPREHENSIVE METABOLIC PANEL
ALT: 22 IU/L (ref 0–32)
AST: 28 IU/L (ref 0–40)
Albumin/Globulin Ratio: 1.1 — ABNORMAL LOW (ref 1.2–2.2)
Albumin: 3.9 g/dL (ref 3.7–4.7)
Alkaline Phosphatase: 154 IU/L — ABNORMAL HIGH (ref 44–121)
BUN/Creatinine Ratio: 16 (ref 12–28)
BUN: 28 mg/dL — ABNORMAL HIGH (ref 8–27)
Bilirubin Total: 0.3 mg/dL (ref 0.0–1.2)
CO2: 15 mmol/L — ABNORMAL LOW (ref 20–29)
Calcium: 8.5 mg/dL — ABNORMAL LOW (ref 8.7–10.3)
Chloride: 105 mmol/L (ref 96–106)
Creatinine, Ser: 1.73 mg/dL — ABNORMAL HIGH (ref 0.57–1.00)
Globulin, Total: 3.7 g/dL (ref 1.5–4.5)
Glucose: 163 mg/dL — ABNORMAL HIGH (ref 70–99)
Potassium: 4.9 mmol/L (ref 3.5–5.2)
Sodium: 137 mmol/L (ref 134–144)
Total Protein: 7.6 g/dL (ref 6.0–8.5)
eGFR: 30 mL/min/{1.73_m2} — ABNORMAL LOW (ref >59)

## 2021-08-14 LAB — HEMOGLOBIN A1C
Est. average glucose Bld gHb Est-mCnc: 177 mg/dL
Hgb A1c MFr Bld: 7.8 % — ABNORMAL HIGH (ref 4.8–5.6)

## 2021-08-14 LAB — LIPID PANEL
Chol/HDL Ratio: 3.8 ratio (ref 0.0–4.4)
Cholesterol, Total: 125 mg/dL (ref 100–199)
HDL: 33 mg/dL — ABNORMAL LOW (ref >39)
LDL Chol Calc (NIH): 69 mg/dL (ref 0–99)
Triglycerides: 130 mg/dL (ref 0–149)
VLDL Cholesterol Cal: 23 mg/dL (ref 5–40)

## 2021-08-17 ENCOUNTER — Encounter: Payer: Self-pay | Admitting: Pharmacist

## 2021-08-17 ENCOUNTER — Ambulatory Visit: Payer: Medicare HMO | Admitting: Pharmacist

## 2021-08-17 VITALS — BP 142/77 | HR 65

## 2021-08-17 DIAGNOSIS — I5032 Chronic diastolic (congestive) heart failure: Secondary | ICD-10-CM | POA: Diagnosis not present

## 2021-08-17 DIAGNOSIS — I10 Essential (primary) hypertension: Secondary | ICD-10-CM | POA: Diagnosis not present

## 2021-08-17 MED ORDER — SPIRONOLACTONE 25 MG PO TABS: ORAL_TABLET | ORAL | 1 refills | Status: DC

## 2021-08-17 NOTE — Progress Notes (Unsigned)
Patient ID: [name]                 DOB: [birth year]                      MRN: [id number]     HPI: Kaitlin Berry is a 76 y.o. female referred by Dr. Ellyn Berry to HTN clinic. PMH is significant for HTN, CHF, T2DM, CKD, and HLD.  Carvedilol was increased to 6.$RemoveBefore'25mg'IExJaaSODxmDD$  BID at last visit with Dr Kaitlin Berry.  Patient presents today with husband. Ambulates with walker. Can not be very physically active.  Eats the majority of their meals at home. Husband does cooking. Recently they have been eating chicken and mashed potatoes.  A1c has decreased from 11.2 to 7.8 over last 2 years. However patient does not check blood sugar often and reports she eats a lot of ice cream.  Drinks mostly water.  Has not been chekcing blood pressure at home. Has a wrist cuff but has not been using. Reports compliance with losartan and carvedilol with no adverse effects.   Current HTN meds:  Losartan $RemoveB'50mg'adXtIBza$   Carvedilol 6.$RemoveBefor'25mg'csgcPVTeGGZI$  BID  Previously tried:  Metoprolol Lisinopril  BP goal: <130/80  Wt Readings from Last 3 Encounters:  07/06/21 291 lb 3.2 oz (132.1 kg)  05/29/21 289 lb 9.6 oz (131.4 kg)  11/20/19 250 lb (113.4 kg)   BP Readings from Last 3 Encounters:  07/06/21 (!) 150/80  06/12/21 (!) 151/64  06/12/21 (!) 151/63   Pulse Readings from Last 3 Encounters:  07/06/21 78  06/12/21 62  06/12/21 87    Renal function: CrCl cannot be calculated (Unknown ideal weight.).  Past Medical History:  Diagnosis Date   Anxiety    Arthritis    rt knee   BACK PAIN 03/24/2007   Qualifier: Diagnosis of  By: Kaitlin Greet MD, Kaitlin Berry    Chronic back pain    DEPRESSION 03/24/2007   Qualifier: Diagnosis of  By: Kaitlin Greet MD, Kaitlin Berry    Diabetes mellitus without complication, with long-term current use of insulin (Kaitlin Berry)    GERD (gastroesophageal reflux disease)    Hyperlipidemia    HYPERSOMNIA 03/24/2007   Qualifier: Diagnosis of  By: Kaitlin Greet MD, Kaitlin Berry    Hypertension    Morbid obesity with BMI of 45.0-49.9, adult  ([place])    OBSTRUCTIVE SLEEP APNEA 05/17/2007   severe with AHI 35.1 on PSG 2009 - on CPAP   Psoriasis    Urticaria 11/27/2011    Current Outpatient Medications on File Prior to Visit  Medication Sig Dispense Refill   blood glucose meter kit and supplies KIT Dispense based on patient and insurance preference. Use up to four times daily as directed. (FOR ICD-9 250.00, 250.01). 1 each 0   carvedilol (COREG) 3.125 MG tablet Take 2 tablets (6.25 mg total) by mouth 2 (two) times daily. 120 tablet 3   citalopram (CELEXA) 40 MG tablet Take 40 mg by mouth daily.     Dulaglutide (TRULICITY) 2.44 WN/0.2VO SOPN Inject 0.75 mg into the skin once a week.     FIASP FLEXTOUCH 100 UNIT/ML FlexTouch Pen Inject 20 Units into the skin See admin instructions. 50 units per meal and if reading is 300 or more,  take additional 50 units, as needed.     insulin degludec (TRESIBA FLEXTOUCH) 100 UNIT/ML SOPN FlexTouch Pen Inject 70 Units into the skin daily.      losartan (COZAAR) 50 MG tablet Take 50 mg by mouth daily.  SKYRIZI PEN 150 MG/ML SOAJ INJECT 150MG (1 PEN) SUBCUTANEOUSLY EVERY 12 WEEKS AS DIRECTED. 1 mL 3   triamcinolone cream (KENALOG) 0.1 % Apply 1 application. topically daily. 80 g 10   vitamin B-12 (CYANOCOBALAMIN) 1000 MCG tablet Take 1,000 mcg by mouth daily.     No current facility-administered medications on file prior to visit.    Allergies  Allergen Reactions   Atorvastatin Other (See Comments)   Ciprofloxacin Other (See Comments) and Hives    Unknown   Ciprofloxacin Hcl Hives   Lisinopril Rash     Assessment/Plan:  1. Hypertension -  Patient BP in room 142/77 which is improved since last visit although still above goal of <130/80.  Advised next steps were to increase losartan or add spironolactone. Due to CHF diagnosis recommended adding spironolactone first.  Will start at 12.50m once daily due to patient's renal function and will check BMP in 1 week.  Recheck in clinic in 4 weeks.  Patient voiced understanding.  Continue losartan 538mdaily Continue carvedilol 6.254mID Start spironolactone 12.5mg36mce daily Check BMP in 1 week  ChriKarren CobblearmD, BCACEnetaiCEAlpaughP [place], [id number]: [phone]: [phone]

## 2021-08-17 NOTE — Patient Instructions (Addendum)
It was nice meeting you today  We would like your blood pressure to be less than 130/80  Please continue your:  Losartan '50mg'$  once a day Carvedilol 6.'25mg'$  twice a day  We will start a new medication called spironolactone 12.'5mg'$  once a day  Please update your blood work next week  We will see you back in about 4 weeks  Karren Cobble, PharmD, [place], [place], [place], [place], [place], [address] Phone: [phone], Fax: [phone]

## 2021-09-09 ENCOUNTER — Emergency Department (HOSPITAL_COMMUNITY): Payer: Medicare HMO

## 2021-09-09 ENCOUNTER — Inpatient Hospital Stay (HOSPITAL_COMMUNITY)
Admission: EM | Admit: 2021-09-09 | Discharge: 2021-09-14 | DRG: 641 | Disposition: A | Discharge status: Skilled Nursing Facility | Payer: Medicare HMO | Attending: Internal Medicine | Admitting: Internal Medicine

## 2021-09-09 ENCOUNTER — Encounter (HOSPITAL_COMMUNITY): Payer: Self-pay

## 2021-09-09 ENCOUNTER — Other Ambulatory Visit: Payer: Self-pay

## 2021-09-09 DIAGNOSIS — Z8249 Family history of ischemic heart disease and other diseases of the circulatory system: Secondary | ICD-10-CM

## 2021-09-09 DIAGNOSIS — E875 Hyperkalemia: Secondary | ICD-10-CM | POA: Diagnosis not present

## 2021-09-09 DIAGNOSIS — N390 Urinary tract infection, site not specified: Secondary | ICD-10-CM | POA: Diagnosis present

## 2021-09-09 DIAGNOSIS — Z881 Allergy status to other antibiotic agents status: Secondary | ICD-10-CM

## 2021-09-09 DIAGNOSIS — E8722 Chronic metabolic acidosis: Secondary | ICD-10-CM | POA: Diagnosis present

## 2021-09-09 DIAGNOSIS — E039 Hypothyroidism, unspecified: Secondary | ICD-10-CM | POA: Diagnosis present

## 2021-09-09 DIAGNOSIS — R531 Weakness: Secondary | ICD-10-CM

## 2021-09-09 DIAGNOSIS — Z888 Allergy status to other drugs, medicaments and biological substances status: Secondary | ICD-10-CM

## 2021-09-09 DIAGNOSIS — I130 Hypertensive heart and chronic kidney disease with heart failure and stage 1 through stage 4 chronic kidney disease, or unspecified chronic kidney disease: Secondary | ICD-10-CM | POA: Diagnosis present

## 2021-09-09 DIAGNOSIS — N184 Chronic kidney disease, stage 4 (severe): Secondary | ICD-10-CM | POA: Diagnosis present

## 2021-09-09 DIAGNOSIS — E785 Hyperlipidemia, unspecified: Secondary | ICD-10-CM | POA: Diagnosis present

## 2021-09-09 DIAGNOSIS — M549 Dorsalgia, unspecified: Secondary | ICD-10-CM | POA: Diagnosis present

## 2021-09-09 DIAGNOSIS — E119 Type 2 diabetes mellitus without complications: Secondary | ICD-10-CM

## 2021-09-09 DIAGNOSIS — Z794 Long term (current) use of insulin: Secondary | ICD-10-CM

## 2021-09-09 DIAGNOSIS — I5032 Chronic diastolic (congestive) heart failure: Secondary | ICD-10-CM | POA: Diagnosis present

## 2021-09-09 DIAGNOSIS — W19XXXA Unspecified fall, initial encounter: Principal | ICD-10-CM

## 2021-09-09 DIAGNOSIS — E1122 Type 2 diabetes mellitus with diabetic chronic kidney disease: Secondary | ICD-10-CM | POA: Diagnosis present

## 2021-09-09 DIAGNOSIS — G4733 Obstructive sleep apnea (adult) (pediatric): Secondary | ICD-10-CM | POA: Diagnosis present

## 2021-09-09 DIAGNOSIS — E1169 Type 2 diabetes mellitus with other specified complication: Secondary | ICD-10-CM | POA: Diagnosis present

## 2021-09-09 DIAGNOSIS — K219 Gastro-esophageal reflux disease without esophagitis: Secondary | ICD-10-CM | POA: Diagnosis present

## 2021-09-09 DIAGNOSIS — Z6841 Body Mass Index (BMI) 40.0 and over, adult: Secondary | ICD-10-CM

## 2021-09-09 DIAGNOSIS — G8929 Other chronic pain: Secondary | ICD-10-CM | POA: Diagnosis present

## 2021-09-09 DIAGNOSIS — Z20822 Contact with and (suspected) exposure to covid-19: Secondary | ICD-10-CM | POA: Diagnosis present

## 2021-09-09 DIAGNOSIS — M542 Cervicalgia: Secondary | ICD-10-CM | POA: Diagnosis present

## 2021-09-09 DIAGNOSIS — Z79899 Other long term (current) drug therapy: Secondary | ICD-10-CM

## 2021-09-09 DIAGNOSIS — Z833 Family history of diabetes mellitus: Secondary | ICD-10-CM

## 2021-09-09 DIAGNOSIS — M1711 Unilateral primary osteoarthritis, right knee: Secondary | ICD-10-CM | POA: Diagnosis present

## 2021-09-09 DIAGNOSIS — L400 Psoriasis vulgaris: Secondary | ICD-10-CM

## 2021-09-09 DIAGNOSIS — N183 Chronic kidney disease, stage 3 unspecified: Secondary | ICD-10-CM | POA: Diagnosis present

## 2021-09-09 DIAGNOSIS — I10 Essential (primary) hypertension: Secondary | ICD-10-CM | POA: Diagnosis present

## 2021-09-09 DIAGNOSIS — Z7985 Long-term (current) use of injectable non-insulin antidiabetic drugs: Secondary | ICD-10-CM

## 2021-09-09 DIAGNOSIS — F32A Depression, unspecified: Secondary | ICD-10-CM | POA: Diagnosis present

## 2021-09-09 DIAGNOSIS — K580 Irritable bowel syndrome with diarrhea: Secondary | ICD-10-CM | POA: Diagnosis present

## 2021-09-09 DIAGNOSIS — G4710 Hypersomnia, unspecified: Secondary | ICD-10-CM | POA: Diagnosis present

## 2021-09-09 LAB — RESP PANEL BY RT-PCR (FLU A&B, COVID) ARPGX2
Influenza A by PCR: NEGATIVE
Influenza B by PCR: NEGATIVE
SARS Coronavirus 2 by RT PCR: NEGATIVE

## 2021-09-09 LAB — CBC WITH DIFFERENTIAL/PLATELET
Abs Immature Granulocytes: 0.03 10*3/uL (ref 0.00–0.07)
Basophils Absolute: 0.1 10*3/uL (ref 0.0–0.1)
Basophils Relative: 1 %
Eosinophils Absolute: 0.4 10*3/uL (ref 0.0–0.5)
Eosinophils Relative: 4 %
HCT: 39 % (ref 36.0–46.0)
Hemoglobin: 12.1 g/dL (ref 12.0–15.0)
Immature Granulocytes: 0 %
Lymphocytes Relative: 19 %
Lymphs Abs: 2 10*3/uL (ref 0.7–4.0)
MCH: 30.5 pg (ref 26.0–34.0)
MCHC: 31 g/dL (ref 30.0–36.0)
MCV: 98.2 fL (ref 80.0–100.0)
Monocytes Absolute: 1 10*3/uL (ref 0.1–1.0)
Monocytes Relative: 10 %
Neutro Abs: 6.9 10*3/uL (ref 1.7–7.7)
Neutrophils Relative %: 66 %
Platelets: 199 10*3/uL (ref 150–400)
RBC: 3.97 MIL/uL (ref 3.87–5.11)
RDW: 14.2 % (ref 11.5–15.5)
WBC: 10.4 10*3/uL (ref 4.0–10.5)
nRBC: 0 % (ref 0.0–0.2)

## 2021-09-09 LAB — URINALYSIS, ROUTINE W REFLEX MICROSCOPIC
Bilirubin Urine: NEGATIVE
Glucose, UA: 50 mg/dL — AB
Ketones, ur: NEGATIVE mg/dL
Nitrite: NEGATIVE
Protein, ur: NEGATIVE mg/dL
Specific Gravity, Urine: 1.015 (ref 1.005–1.030)
pH: 6 (ref 5.0–8.0)

## 2021-09-09 LAB — BASIC METABOLIC PANEL
Anion gap: 6 (ref 5–15)
Anion gap: 6 (ref 5–15)
BUN: 39 mg/dL — ABNORMAL HIGH (ref 8–23)
BUN: 37 mg/dL — ABNORMAL HIGH (ref 8–23)
CO2: 17 mmol/L — ABNORMAL LOW (ref 22–32)
CO2: 16 mmol/L — ABNORMAL LOW (ref 22–32)
Calcium: 8.8 mg/dL — ABNORMAL LOW (ref 8.9–10.3)
Calcium: 8.5 mg/dL — ABNORMAL LOW (ref 8.9–10.3)
Chloride: 114 mmol/L — ABNORMAL HIGH (ref 98–111)
Chloride: 115 mmol/L — ABNORMAL HIGH (ref 98–111)
Creatinine, Ser: 1.77 mg/dL — ABNORMAL HIGH (ref 0.44–1.00)
Creatinine, Ser: 1.89 mg/dL — ABNORMAL HIGH (ref 0.44–1.00)
GFR, Estimated: 30 mL/min — ABNORMAL LOW (ref >60)
GFR, Estimated: 27 mL/min — ABNORMAL LOW (ref >60)
Glucose, Bld: 155 mg/dL — ABNORMAL HIGH (ref 70–99)
Glucose, Bld: 185 mg/dL — ABNORMAL HIGH (ref 70–99)
Potassium: 6.5 mmol/L (ref 3.5–5.1)
Potassium: 6.8 mmol/L (ref 3.5–5.1)
Sodium: 137 mmol/L (ref 135–145)
Sodium: 137 mmol/L (ref 135–145)

## 2021-09-09 LAB — GLUCOSE, CAPILLARY
Glucose-Capillary: 163 mg/dL — ABNORMAL HIGH (ref 70–99)
Glucose-Capillary: 143 mg/dL — ABNORMAL HIGH (ref 70–99)

## 2021-09-09 MED ORDER — NYSTATIN 100000 UNIT/GM EX POWD
Freq: Three times a day (TID) | CUTANEOUS | Status: DC
  Filled 2021-09-09 (×2): qty 15

## 2021-09-09 MED ORDER — DULAGLUTIDE 0.75 MG/0.5ML ~~LOC~~ SOAJ: 0.7500 mg | SUBCUTANEOUS | Status: DC

## 2021-09-09 MED ORDER — FENTANYL CITRATE PF 50 MCG/ML IJ SOSY
50.0000 ug | PREFILLED_SYRINGE | Freq: Once | INTRAMUSCULAR | Status: AC
  Administered 2021-09-09: 50 ug via INTRAMUSCULAR
  Filled 2021-09-09: qty 1

## 2021-09-09 MED ORDER — SODIUM ZIRCONIUM CYCLOSILICATE 5 G PO PACK
5.0000 g | PACK | Freq: Once | ORAL | Status: AC
Start: 2021-09-09 — End: 2021-09-09
  Administered 2021-09-09: 5 g via ORAL
  Filled 2021-09-09: qty 1

## 2021-09-09 MED ORDER — INSULIN DEGLUDEC 100 UNIT/ML ~~LOC~~ SOPN: 70.0000 [IU] | PEN_INJECTOR | Freq: Every day | SUBCUTANEOUS | Status: DC

## 2021-09-09 MED ORDER — LACTATED RINGERS IV SOLN: INTRAVENOUS | Status: DC

## 2021-09-09 MED ORDER — ACETAMINOPHEN 325 MG PO TABS
650.0000 mg | ORAL_TABLET | Freq: Four times a day (QID) | ORAL | Status: DC | PRN
  Administered 2021-09-09 – 2021-09-11 (×4): 650 mg via ORAL
  Filled 2021-09-09 (×5): qty 2

## 2021-09-09 MED ORDER — ACETAMINOPHEN 650 MG RE SUPP: 650.0000 mg | Freq: Four times a day (QID) | RECTAL | Status: DC | PRN

## 2021-09-09 MED ORDER — INSULIN ASPART (W/NIACINAMIDE) 100 UNIT/ML ~~LOC~~ SOPN: 20.0000 [IU] | PEN_INJECTOR | Freq: Three times a day (TID) | SUBCUTANEOUS | Status: DC

## 2021-09-09 MED ORDER — HYDROMORPHONE HCL 1 MG/ML IJ SOLN
1.0000 mg | Freq: Once | INTRAMUSCULAR | Status: AC
  Administered 2021-09-09: 1 mg via INTRAVENOUS
  Filled 2021-09-09: qty 1

## 2021-09-09 MED ORDER — LACTATED RINGERS IV BOLUS
1000.0000 mL | Freq: Once | INTRAVENOUS | Status: AC
  Administered 2021-09-09: 1000 mL via INTRAVENOUS

## 2021-09-09 MED ORDER — INSULIN ASPART 100 UNIT/ML IJ SOLN: 50.0000 [IU] | Freq: Three times a day (TID) | INTRAMUSCULAR | Status: DC

## 2021-09-09 MED ORDER — CITALOPRAM HYDROBROMIDE 20 MG PO TABS
40.0000 mg | ORAL_TABLET | Freq: Every day | ORAL | Status: DC
  Administered 2021-09-09: 40 mg via ORAL
  Filled 2021-09-09: qty 4

## 2021-09-09 MED ORDER — SODIUM BICARBONATE 8.4 % IV SOLN
25.0000 meq | Freq: Once | INTRAVENOUS | Status: AC
  Administered 2021-09-09: 25 meq via INTRAVENOUS
  Filled 2021-09-09: qty 50

## 2021-09-09 MED ORDER — TRIAMCINOLONE ACETONIDE 0.1 % EX CREA: 1.0000 | TOPICAL_CREAM | Freq: Every day | CUTANEOUS | Status: DC | PRN

## 2021-09-09 MED ORDER — INSULIN GLARGINE-YFGN 100 UNIT/ML ~~LOC~~ SOLN
70.0000 [IU] | Freq: Every day | SUBCUTANEOUS | Status: DC
Start: 2021-09-09 — End: 2021-09-11
  Administered 2021-09-09 – 2021-09-11 (×3): 70 [IU] via SUBCUTANEOUS
  Filled 2021-09-09 (×3): qty 0.7

## 2021-09-09 MED ORDER — INSULIN ASPART 100 UNIT/ML IJ SOLN
0.0000 [IU] | Freq: Three times a day (TID) | INTRAMUSCULAR | Status: DC
  Administered 2021-09-10 (×3): 3 [IU] via SUBCUTANEOUS
  Administered 2021-09-11: 4 [IU] via SUBCUTANEOUS
  Administered 2021-09-12 – 2021-09-14 (×5): 3 [IU] via SUBCUTANEOUS
  Filled 2021-09-09: qty 0.2

## 2021-09-09 MED ORDER — CALCIUM GLUCONATE-NACL 1-0.675 GM/50ML-% IV SOLN
1.0000 g | Freq: Once | INTRAVENOUS | Status: AC
  Administered 2021-09-09: 1000 mg via INTRAVENOUS
  Filled 2021-09-09: qty 50

## 2021-09-09 MED ORDER — CITALOPRAM HYDROBROMIDE 20 MG PO TABS
20.0000 mg | ORAL_TABLET | Freq: Every day | ORAL | Status: DC
  Administered 2021-09-10 – 2021-09-14 (×5): 20 mg via ORAL
  Filled 2021-09-09 (×5): qty 1

## 2021-09-09 MED ORDER — IPRATROPIUM-ALBUTEROL 0.5-2.5 (3) MG/3ML IN SOLN
3.0000 mL | RESPIRATORY_TRACT | Status: DC | PRN
  Administered 2021-09-10 (×2): 3 mL via RESPIRATORY_TRACT
  Filled 2021-09-09 (×2): qty 3

## 2021-09-09 MED ORDER — ONDANSETRON HCL 4 MG/2ML IJ SOLN: 4.0000 mg | Freq: Four times a day (QID) | INTRAMUSCULAR | Status: DC | PRN

## 2021-09-09 MED ORDER — SODIUM ZIRCONIUM CYCLOSILICATE 10 G PO PACK
10.0000 g | PACK | Freq: Once | ORAL | Status: AC
Start: 2021-09-09 — End: 2021-09-09
  Administered 2021-09-09: 10 g via ORAL
  Filled 2021-09-09: qty 1

## 2021-09-09 MED ORDER — ONDANSETRON HCL 4 MG PO TABS: 4.0000 mg | ORAL_TABLET | Freq: Four times a day (QID) | ORAL | Status: DC | PRN

## 2021-09-09 MED ORDER — IPRATROPIUM-ALBUTEROL 0.5-2.5 (3) MG/3ML IN SOLN
3.0000 mL | Freq: Once | RESPIRATORY_TRACT | Status: AC
  Administered 2021-09-09: 3 mL via RESPIRATORY_TRACT
  Filled 2021-09-09: qty 3

## 2021-09-09 MED ORDER — CARVEDILOL 6.25 MG PO TABS
6.2500 mg | ORAL_TABLET | Freq: Two times a day (BID) | ORAL | Status: DC
  Administered 2021-09-09 – 2021-09-14 (×11): 6.25 mg via ORAL
  Filled 2021-09-09 (×7): qty 1
  Filled 2021-09-09: qty 2
  Filled 2021-09-09 (×3): qty 1

## 2021-09-09 MED ORDER — ENOXAPARIN SODIUM 40 MG/0.4ML IJ SOSY
40.0000 mg | PREFILLED_SYRINGE | INTRAMUSCULAR | Status: DC
Start: 2021-09-09 — End: 2021-09-12
  Administered 2021-09-09 – 2021-09-11 (×3): 40 mg via SUBCUTANEOUS
  Filled 2021-09-09 (×3): qty 0.4

## 2021-09-09 MED ORDER — LOSARTAN POTASSIUM 25 MG PO TABS
50.0000 mg | ORAL_TABLET | Freq: Every day | ORAL | Status: DC
  Administered 2021-09-09: 50 mg via ORAL
  Filled 2021-09-09: qty 2

## 2021-09-09 MED ORDER — VITAMIN B-12 1000 MCG PO TABS
1000.0000 ug | ORAL_TABLET | Freq: Every day | ORAL | Status: DC
  Administered 2021-09-09 – 2021-09-14 (×6): 1000 ug via ORAL
  Filled 2021-09-09 (×6): qty 1

## 2021-09-09 MED ORDER — OXYCODONE HCL 5 MG PO TABS
10.0000 mg | ORAL_TABLET | ORAL | Status: DC | PRN
  Administered 2021-09-09 – 2021-09-13 (×13): 10 mg via ORAL
  Filled 2021-09-09 (×14): qty 2

## 2021-09-09 MED ORDER — INSULIN ASPART 100 UNIT/ML IJ SOLN: 20.0000 [IU] | Freq: Three times a day (TID) | INTRAMUSCULAR | Status: DC

## 2021-09-09 MED ORDER — SPIRONOLACTONE 25 MG PO TABS
25.0000 mg | ORAL_TABLET | Freq: Once | ORAL | Status: DC
Start: 2021-09-09 — End: 2021-09-09

## 2021-09-09 MED ORDER — INSULIN ASPART (W/NIACINAMIDE) 100 UNIT/ML ~~LOC~~ SOPN
20.0000 [IU] | PEN_INJECTOR | SUBCUTANEOUS | Status: DC
Start: 2021-09-09 — End: 2021-09-09

## 2021-09-09 MED ORDER — OXYCODONE-ACETAMINOPHEN 5-325 MG PO TABS
1.0000 | ORAL_TABLET | ORAL | Status: DC | PRN
  Administered 2021-09-09: 1 via ORAL
  Filled 2021-09-09: qty 1

## 2021-09-09 NOTE — ED Notes (Signed)
PT at bedside.

## 2021-09-09 NOTE — Progress Notes (Addendum)
[facility]: [id number]

## 2021-09-09 NOTE — ED Provider Notes (Addendum)
Patient care taken from [facility], PA-C at shift change.  In short, 76 year old female who presents the emergency department with right hip and knee pain after a mechanical fall.  Patient has underlying chronic right hip pain and known arthritis in the right knee.  Patient normally ambulates using a walker.  Patient does not take blood thinners. Physical Exam  BP 100/86   Pulse 74   Temp 98.2 F (36.8 C) (Oral)   Resp 16   Ht '5\' 3"'$  (1.6 m)   Wt 129.7 kg   SpO2 96%   BMI 50.66 kg/m   Physical Exam  Procedures  Procedures  ED Course / MDM    Medical Decision Making Amount and/or Complexity of Data Reviewed Labs: ordered. Radiology: ordered.  Risk OTC drugs. Prescription drug management. Decision regarding hospitalization.   I reviewed and interpreted CT scans of the right hip and right knee.  No acute fracture or dislocation noted in the right hip.  Mild arthritis.  Tricompartmental degenerative changes noted in the right knee with no fracture or dislocation.  I agree with the radiologist findings.  Currently awaiting PT evaluation and recommendations  Physical therapy is recommending SNF placement for patient.  TOC consult placed.  I have ordered basic labs, diet, and home meds. Plan on boarding patient until SNF placement is worked out  [employer] were reviewed.  Pertinent results include potassium 6.5.  Creatinine 1.77 is at patient's baseline.  I ordered Dilaudid, oxycodone for pain.  Also ordered calcium gluconate and sodium bicarbonate due to patient's hyperkalemia  EKG ordered and pending at this time.  Plan on discussing patient with hospitalist for possible admission due to hyperkalemia and immobility.  Dr.Ortiz plans to see patient for possible admission. Admit to hospital     Ronny Bacon 09/09/21 1500    Ronny Bacon 09/09/21 1502    Ezequiel Essex, MD 09/09/21 1729

## 2021-09-09 NOTE — H&P (Signed)
History and Physical    Patient: Kaitlin Berry JGG:[id number] DOB: May 20, 1945 DOA: 09/09/2021 DOS: the patient was seen and examined on 09/09/2021 PCP: Christain Sacramento, MD  Patient coming from: Home  Chief Complaint:  Chief Complaint  Patient presents with   Fall   HPI: Kaitlin Berry is a 76 y.o. female with medical history significant of anxiety, osteoarthritis of the right knee, chronic back pain, depression, type 2 diabetes, class III obesity with a BMI of 50.66 kg/m, GERD, IBS, hyperlipidemia, hypersomnia, hypertension, obstructive sleep apnea on CPAP, psoriasis, urticaria who had a fall at home while rushing to the bathroom due to having diarrhea secondary to her IBS.  She fell and hit her right knee and has significant difficulty getting up afterwards.  She has exacerbated chronic right knee pain.  She normally walks with a walker at home.  She was offered a knee surgery, but was asked by orthopedic surgery to lose weight first.  She has had some neck pain which is chronic.  Her back pain is not exacerbated.  She denied fever, chills, rhinorrhea, sore throat, wheezing or hemoptysis.  No chest pain, palpitations, diaphoresis, PND, orthopnea or pitting edema of the lower extremities.  No abdominal pain, nausea, emesis, diarrhea, constipation, melena or hematochezia.  No flank pain, dysuria, frequency or hematuria.  No polyuria, polydipsia, polyphagia or blurred vision.   ED course: Initial vital signs were temperature 97.4 F, pulse 66, respiration 18, BP 198 over 80 mmHg and O2 sat 96% on room air.  The patient received bicarbonate, insulin and calcium gluconate in the emergency department.  Lab work: Her urinalysis showed glucosuria of 50 mg/dL, small hemoglobinuria and moderate leukocyte esterase.  CBC was normal.  BMP showed sodium of 137, potassium 6.5, chloride 114 and CO2 is 17 mmol/L.  Anion gap was normal.  Glucose 155, BUN 39 and creatinine 1.77 mg/dL.  Renal function is consistent  with previous measurements.  Imaging: Chest radiograph with cardiomegaly but no acute finding.  Right knee-straightahead right hip x-rays, CT head, CT right knee than stated right hip with no acute abnormality.  Please see images and full radiology report for further details.   Review of Systems: As mentioned in the history of present illness. All other systems reviewed and are negative. Past Medical History:  Diagnosis Date   Anxiety    Arthritis    rt knee   BACK PAIN 03/24/2007   Qualifier: Diagnosis of  By: Gwenette Greet MD, Armando Reichert    Chronic back pain    DEPRESSION 03/24/2007   Qualifier: Diagnosis of  By: Gwenette Greet MD, Armando Reichert    Diabetes mellitus without complication, with long-term current use of insulin ([place])    GERD (gastroesophageal reflux disease)    Hyperlipidemia    HYPERSOMNIA 03/24/2007   Qualifier: Diagnosis of  By: Gwenette Greet MD, Armando Reichert    Hypertension    Morbid obesity with BMI of 45.0-49.9, adult ([place])    OBSTRUCTIVE SLEEP APNEA 05/17/2007   severe with AHI 35.1 on PSG 2009 - on CPAP   Psoriasis    Urticaria 11/27/2011   Past Surgical History:  Procedure Laterality Date   ABDOMINAL HYSTERECTOMY     BACK SURGERY     lumbar   BREAST LUMPECTOMY WITH RADIOACTIVE SEED LOCALIZATION Right 10/01/2014   Procedure: RIGHT BREAST LUMPECTOMY WITH RADIOACTIVE SEED LOCALIZATION;  Surgeon: Donnie Mesa, MD;  Location: [facility];  Service: General;  Laterality: Right;   CHOLECYSTECTOMY  CT CTA CORONARY W/CA SCORE W/CM &/OR WO/CM  06/12/2021   Coronary Calcium Score 100.  Minimal nonobstructive CAD (1 to 24%).  Consider nonatherosclerotic cause of chest pain or dyspnea.  Normal pulmonary vein drainage.  Normal left atrial appendage with no thrombus.  No significant noncardiac findings.   INCISIONAL BREAST BIOPSY     multiple   NM MYOVIEW LTD  11/2016   Normal LVEF - NO ISCHEMIA OR INFARCTION -- LOW RISK / NORMAL   SPINE SURGERY     TRANSTHORACIC ECHOCARDIOGRAM   11/29/2016   a) 10/'18: EF 60-65%.  Nl LV size w/ mild LVH.  No RWMA.  GR 1 DD & Nl LA size.  Mild MAC.  Trivial MR.  Mildly calcified ApV (sclerosis w/o stenosis).  Nl RV size & fxn.  Nl RVP & RAP.;; b) 08/01/2019: EF 60-65%.  No RWMA.  Nl LV size & fxn.  GR 1 DD.  Nl LA size.  No MR.  Mild AoV sclerosis w/o stenosis.  Nl RV size & fxn.  Nl RAP.  Unable to assess PA.-Poor windows.   TRANSTHORACIC ECHOCARDIOGRAM  06/16/2021   EF 70 to 75%.  Hyperdynamic LV with no RWMA.  Moderate LVH.  GR 1 DD.  Mild LA dilation.  Normal valves.   Social History:  reports that she has never smoked. She has never used smokeless tobacco. She reports current alcohol use. She reports that she does not use drugs.  Allergies  Allergen Reactions   Atorvastatin Other (See Comments)   Ciprofloxacin Other (See Comments) and Hives    Unknown   Ciprofloxacin Hcl Hives   Lisinopril Rash    Family History  Problem Relation Age of Onset   Alzheimer's disease Mother    Diabetes Father    Cancer Father    Heart disease Father    Diabetes Brother    Heart attack Brother    Lung cancer Brother    Stroke Brother     Prior to Admission medications   Medication Sig Start Date End Date Taking? Authorizing Provider  carvedilol (COREG) 3.125 MG tablet Take 2 tablets (6.25 mg total) by mouth 2 (two) times daily. 07/06/21  Yes Leonie Man, MD  citalopram (CELEXA) 40 MG tablet Take 40 mg by mouth daily.   Yes [provider]  Dulaglutide (TRULICITY) 2.84 XL/2.4MW SOPN Inject 0.75 mg into the skin once a week. 08/27/19  Yes [provider]  FIASP FLEXTOUCH 100 UNIT/ML FlexTouch Pen Inject 20 Units into the skin See admin instructions. 50 units per meal and if reading is 300 or more,  take additional 50 units, as needed. 08/05/19  Yes Charlynne Cousins, MD  insulin degludec (TRESIBA FLEXTOUCH) 100 UNIT/ML SOPN FlexTouch Pen Inject 70 Units into the skin daily.    Yes [provider]  losartan  (COZAAR) 25 MG tablet Take 50 mg by mouth daily.   Yes [provider]  SKYRIZI PEN 150 MG/ML SOAJ INJECT 150MG (1 PEN) SUBCUTANEOUSLY EVERY 12 WEEKS AS DIRECTED. Patient taking differently: Inject 150 mg into the skin every 3 (three) months. 3 months=12 Weeks 08/04/21  Yes Lavonna Monarch, MD  spironolactone (ALDACTONE) 25 MG tablet Take 1/2 tablet (12.82m) once daily in the morning 08/17/21  Yes HLeonie Man MD  triamcinolone cream (KENALOG) 0.1 % Apply 1 application. topically daily. Patient taking differently: Apply 1 application  topically daily as needed (flare up). 07/02/21  Yes TLavonna Monarch MD  vitamin B-12 (CYANOCOBALAMIN) 1000 MCG tablet Take  1,000 mcg by mouth daily.   Yes [provider]  blood glucose meter kit and supplies KIT Dispense based on patient and insurance preference. Use up to four times daily as directed. (FOR ICD-9 250.00, 250.01). 08/05/19   Charlynne Cousins, MD    Physical Exam: Vitals:   09/09/21 1400 09/09/21 1411 09/09/21 1415 09/09/21 1430  BP: (!) 174/71 (!) 174/71 (!) 179/64 (!) 189/79  Pulse: 78 79 80 77  Resp:    18  Temp:      TempSrc:      SpO2: 94%  96% 94%  Weight:      Height:       Physical Exam Vitals and nursing note reviewed.  Constitutional:      General: She is awake.     Appearance: She is morbidly obese.  HENT:     Head: Normocephalic.     Mouth/Throat:     Mouth: Mucous membranes are dry.  Eyes:     General: No scleral icterus.    Pupils: Pupils are equal, round, and reactive to light.  Neck:     Vascular: No JVD.  Cardiovascular:     Rate and Rhythm: Normal rate and regular rhythm.     Heart sounds: S1 normal and S2 normal.  Pulmonary:     Effort: Pulmonary effort is normal.     Breath sounds: Normal breath sounds.  Abdominal:     General: Bowel sounds are normal.     Palpations: Abdomen is soft.     Tenderness: There is no abdominal tenderness. There is no right CVA tenderness, left CVA tenderness  or guarding.  Musculoskeletal:     Cervical back: Neck supple.     Right lower leg: No edema.     Left lower leg: No edema.  Skin:    General: Skin is warm and dry.     Findings: Erythema present.     Comments: Bilateral inguinal erythema.  Neurological:     General: No focal deficit present.     Mental Status: She is alert and oriented to person, place, and time.  Psychiatric:        Mood and Affect: Mood normal.        Behavior: Behavior is cooperative.     Data Reviewed:  There are no new results to review at this time.  Assessment and Plan: Principal Problem:   Hyperkalemia Observation/telemetry. Continue time-limited IV fluids. Hold ARB/. Hold spironolactone. Monitor intake and output. Monitor renal function electrolytes. Lokelma 10 g p.o. x1 dose. Follow-up potassium level.  Active Problems:   Hyperlipidemia associated with type 2 diabetes mellitus ([place]) Currently not on medical therapy. Should follow-up with primary care provider.    OBSTRUCTIVE SLEEP APNEA Continue CPAP at bedtime.    Morbid obesity (HCC) BMI 50.66 kg/m. Lifestyle modifications.  Follow-up with PCP.    Hypothyroidism Not on levothyroxine. Check TSH level in AM.    Essential hypertension Hold ARB and spironolactone. Continue carvedilol 3.125 mg p.o. twice daily. Monitor blood pressure, heart rate, renal function.    Chronic diastolic heart failure (HCC) No signs of decompensation.. Continue carvedilol, but hold spironolactone and losartan.    CKD (chronic kidney disease), stage III (HCC) Monitor renal function electrolytes.    Insulin-requiring or dependent type II diabetes mellitus (HCC) Carbohydrate modified diet. Trulicity was ordered by ED. Continue Tresiba 70 units daily. CBG monitoring before meals and bedtime.     Advance Care Planning:   Code Status: Full Code  Consults:  Family Communication: Her son was at bedside.  Severity of Illness: The appropriate  patient status for this patient is OBSERVATION. Observation status is judged to be reasonable and necessary in order to provide the required intensity of service to ensure the patient's safety. The patient's presenting symptoms, physical exam findings, and initial radiographic and laboratory data in the context of their medical condition is felt to place them at decreased risk for further clinical deterioration. Furthermore, it is anticipated that the patient will be medically stable for discharge from the hospital within 2 midnights of admission.   Author: Reubin Milan, MD 09/09/2021 5:11 PM  For on call review [email or web address].   This document was prepared using Dragon voice recognition software and may contain some unintended transcription errors.

## 2021-09-09 NOTE — ED Triage Notes (Signed)
Patient BIB [facility] from home. Got up to use bathroom, slipped on the floor. Denies losing consciousness, not on blood thinners. Right knee pain and right pain. Chronic hip pain.

## 2021-09-09 NOTE — Progress Notes (Signed)
CBG not crossing over in computer CBG at 1827 was 163

## 2021-09-09 NOTE — ED Notes (Signed)
Attempted to ambulate patient, was unable to bear weight. Then we wrapped both knees with ace wraps and used a walker still unable to bear weight. Md notified

## 2021-09-09 NOTE — Evaluation (Signed)
Physical Therapy Evaluation Patient Details Name: Kaitlin Berry MRN: [id number] DOB: 1945-09-14 Today's Date: 09/09/2021  History of Present Illness  Kaitlin Berry is 76 yo female  brought to ED 7/11 after fall at  home, negative for fractures, significant arthritis right knee. PMH: OSA, obesity, chronic heart failure DM, anxiety and depression, arthritis.  Clinical Impression  The patient  reports a fall and has now increased pain Right knee and hip. Ne patient  assisted to sitting onto bed edge with max assist. Patient able to partially stand at [facility] with +2 mod assist as patient's  feet did not  reach floor and felt unsafe to attempt from high surface.  Patient  was ambulatory with Rollator  house hold distances until fall. Patient currently max of 2 for mobility. Spouse present and aware of recommendation for SNF for rehab. Spouse reports son in law is installing a ramp but presently, patient unable to safely transfer to a WC. Pt admitted with above diagnosis. P Pt currently with functional limitations due to the deficits listed below (see PT Problem List). Pt will benefit from skilled PT to increase their independence and safety with mobility to allow discharge to the venue listed below.          Recommendations for follow up therapy are one component of a multi-disciplinary discharge planning process, led by the attending physician.  Recommendations may be updated based on patient status, additional functional criteria and insurance authorization.  Follow Up Recommendations Skilled nursing-short term rehab (<3 hours/day) Can patient physically be transported by private vehicle: No    Assistance Recommended at Discharge Frequent or constant Supervision/Assistance  Patient can return home with the following  Two people to help with bathing/dressing/bathroom;Two people to help with walking and/or transfers;Assistance with cooking/housework;Assist for transportation;Help with stairs or  ramp for entrance    Equipment Recommendations None recommended by PT  Recommendations for Other Services       Functional Status Assessment Patient has had a recent decline in their functional status and demonstrates the ability to make significant improvements in function in a reasonable and predictable amount of time.     Precautions / Restrictions Precautions Precautions: Fall Precaution Comments: right knee does not fully extend      Mobility  Bed Mobility Overal bed mobility: Needs Assistance Bed Mobility: Supine to Sit, Sit to Supine     Supine to sit: Max assist Sit to supine: Max assist   General bed mobility comments: assist with trunk to sit up, patient able to weight shift  to scoot, feet not touching floor, Max assist for legs back onto bed and trunk.    Transfers Overall transfer level: Needs assistance   Transfers: Sit to/from Stand Sit to Stand: +2 safety/equipment, +2 physical assistance, From elevated surface           General transfer comment: Partially Stood with +2 mod assistance at [facility], able to clear buttocks from stretcher a few inches briefly, Inacreased pain in right  knee and hip reported by patient.    Ambulation/Gait               General Gait Details: unable  Stairs            Wheelchair Mobility    Modified Rankin (Stroke Patients Only)       Balance Overall balance assessment: Needs assistance, History of Falls Sitting-balance support: Feet unsupported, Bilateral upper extremity supported Sitting balance-Leahy Scale: Poor Sitting balance - Comments: requires support, body  habitus limiting and feet not reaching floor. Postural control: Posterior lean Standing balance support: During functional activity, Bilateral upper extremity supported, Reliant on assistive device for balance Standing balance-Leahy Scale: Poor Standing balance comment: at [facility]                             Pertinent  Vitals/Pain Pain Assessment Pain Assessment: 0-10 Pain Score: 8  Pain Location: right knee and right hip Pain Descriptors / Indicators: Aching, Discomfort Pain Intervention(s): Monitored during session, Limited activity within patient's tolerance, Repositioned    Home Living Family/patient expects to be discharged to:: Private residence Living Arrangements: Spouse/significant other Available Help at Discharge: Family;Available 24 hours/day Type of Home: House Home Access: Stairs to enter   [employer] of Steps: son is bukding ramp Alternate Level Stairs-Number of Steps: has a atair glide- has RW on both levels Home Layout: Two level Home Equipment: Rollator (4 wheels);Wheelchair - manual;Shower seat Additional Comments: able to walk with RW through home    Prior Function Prior Level of Function : Needs assist       Physical Assist : Mobility (physical);ADLs (physical) Mobility (physical): Bed mobility   Mobility Comments: assist with bed mobility, sleeps in recliner ADLs Comments: has been able to self toilet, assited with shoower.     Hand Dominance   Dominant Hand: Right    Extremity/Trunk Assessment   Upper Extremity Assessment Upper Extremity Assessment: Generalized weakness    Lower Extremity Assessment Lower Extremity Assessment: RLE deficits/detail RLE Deficits / Details: lacks 25* knee extension, Flexion to ~ 60 * did not bear weight when stood at [facility]    Cervical / Trunk Assessment Cervical / Trunk Assessment: Other exceptions;Back Surgery;Neck Surgery Cervical / Trunk Exceptions: body habitus, limited trunk and neck ROM  Communication   Communication: No difficulties  Cognition Arousal/Alertness: Awake/alert Behavior During Therapy: WFL for tasks assessed/performed Overall Cognitive Status: Difficult to assess Area of Impairment: Orientation                 Orientation Level: Time             General Comments: states that she  does not not usually know the date, knows it's July        General Comments      Exercises     Assessment/Plan    PT Assessment Patient needs continued PT services  PT Problem List Decreased strength;Decreased mobility;Decreased range of motion;Decreased knowledge of precautions;Decreased activity tolerance;Decreased balance;Pain;Decreased knowledge of use of DME;Obesity       PT Treatment Interventions DME instruction;Therapeutic activities;Gait training;Therapeutic exercise;Patient/family education;Functional mobility training    PT Goals (Current goals can be found in the Care Plan section)  Acute Rehab PT Goals Patient Stated Goal: I want to go home PT Goal Formulation: With patient/family Time For Goal Achievement: 09/23/21 Potential to Achieve Goals: Fair    Frequency Min 2X/week     Co-evaluation               AM-PAC PT "6 Clicks" Mobility  Outcome Measure Help needed turning from your back to your side while in a flat bed without using bedrails?: Total Help needed moving from lying on your back to sitting on the side of a flat bed without using bedrails?: Total Help needed moving to and from a bed to a chair (including a wheelchair)?: Total Help needed standing up from a chair using your arms (e.g., wheelchair or bedside chair)?:  Total Help needed to walk in hospital room?: Total Help needed climbing 3-5 steps with a railing? : Total 6 Click Score: 6    End of Session Equipment Utilized During Treatment: Gait belt Activity Tolerance: Patient limited by fatigue;Patient limited by pain Patient left: in bed;with family/visitor present Nurse Communication: Mobility status;Need for lift equipment PT Visit Diagnosis: Muscle weakness (generalized) (M62.81);History of falling (Z91.81);Pain Pain - Right/Left: Right Pain - part of body: Leg;Knee    Time: 1151-1229 PT Time Calculation (min) (ACUTE ONLY): 38 min   Charges:   PT Evaluation $PT Eval Low  Complexity: 1 Low PT Treatments $Therapeutic Activity: 23-37 mins        [place] Office [phone] Weekend [phone]   Claretha Cooper 09/09/2021, 1:57 PM

## 2021-09-09 NOTE — ED Notes (Signed)
CSW went to speak with pt regarding PT's recommendation for SNF Placement. Pt's husband, Herbie Baltimore was present. Pt and her husband shared that she fell at the home and decided to contact EMS when pain worsened. Pt and her husband shared that she has never been to rehab and stated "this is new to [place]." CSW asked pt and husband if they had preferred facilities. Herbie Baltimore stated "we know of places others have been but other than that, no." CSW asked pt and her husband where they would like information sent to. Pt and Herbie Baltimore said they would like her to be placed in [place]. Herbie Baltimore inquired about how to research on facilities. CSW encouraged pt to research on medicare.gov or we could provide a print out of facilities for them to look over. CSW will complete FL2 and send to SNF's in [place] per pt request.   Ulysees Barns, [email or web address], [email or web address]

## 2021-09-09 NOTE — ED Provider Notes (Addendum)
[facility] DEPT Provider Note   CSN: [id number] Arrival date & time: 09/09/21  0216     History  Chief Complaint  Patient presents with   Lytle Michaels    Kaitlin Berry is a 76 y.o. female.   Fall    Patient presents to ED by [facility] from home due to fall.  Patient was walking to the bathroom, she slipped on the floor and fell on her right side.  She is not sure if she had her head, does not think she lost consciousness.  She is having pain in the right knee and right side of her rib cage.  She has chronic right hip pain and states she has pain there as well.  Not having any nausea or vomiting, denies any prodromal symptoms.  At baseline she ambulates with a walker.  She is not on any blood thinners.  Denies any paresthesias, does not have any chest pain and feels somewhat short of breath.  Patient lives with her husband who is at bedside.  There are stairs in the home, they use a stair lift to get up.  She ambulates with a walker, was in physical therapy at some point but quit a few years ago due to it being uncomfortable.  Home Medications Prior to Admission medications   Medication Sig Start Date End Date Taking? Authorizing Provider  blood glucose meter kit and supplies KIT Dispense based on patient and insurance preference. Use up to four times daily as directed. (FOR ICD-9 250.00, 250.01). 08/05/19   Charlynne Cousins, MD  carvedilol (COREG) 3.125 MG tablet Take 2 tablets (6.25 mg total) by mouth 2 (two) times daily. 07/06/21   Leonie Man, MD  citalopram (CELEXA) 40 MG tablet Take 40 mg by mouth daily.    [provider]  Dulaglutide (TRULICITY) 8.29 HB/7.1IR SOPN Inject 0.75 mg into the skin once a week. 08/27/19   [provider]  FIASP FLEXTOUCH 100 UNIT/ML FlexTouch Pen Inject 20 Units into the skin See admin instructions. 50 units per meal and if reading is 300 or more,  take additional 50 units, as needed. 08/05/19   Charlynne Cousins, MD  insulin degludec (TRESIBA FLEXTOUCH) 100 UNIT/ML SOPN FlexTouch Pen Inject 70 Units into the skin daily.     [provider]  losartan (COZAAR) 50 MG tablet Take 50 mg by mouth daily.    [provider]  SKYRIZI PEN 150 MG/ML SOAJ INJECT 150MG (1 PEN) SUBCUTANEOUSLY EVERY 12 WEEKS AS DIRECTED. 08/04/21   Lavonna Monarch, MD  spironolactone (ALDACTONE) 25 MG tablet Take 1/2 tablet (12.7m) once daily in the morning 08/17/21   HLeonie Man MD  triamcinolone cream (KENALOG) 0.1 % Apply 1 application. topically daily. 07/02/21   TLavonna Monarch MD  vitamin B-12 (CYANOCOBALAMIN) 1000 MCG tablet Take 1,000 mcg by mouth daily.    [provider]      Allergies    Atorvastatin, Ciprofloxacin, Ciprofloxacin hcl, and Lisinopril    Review of Systems   Review of Systems  Physical Exam Updated Vital Signs BP (!) 160/73 (BP Location: Right Arm)   Pulse 71   Temp 98.2 F (36.8 C) (Oral)   Resp 18   Ht 5' 3"  (1.6 m)   Wt 129.7 kg   SpO2 94%   BMI 50.66 kg/m  Physical Exam Vitals and nursing note reviewed. Exam conducted with a chaperone present.  Constitutional:      Appearance: Normal appearance.  HENT:     Head: Normocephalic and atraumatic.     Comments: No periorbital ecchymosis, scalp lacerations, battle sign, septal hematoma Eyes:     General: No scleral icterus.       Right eye: No discharge.        Left eye: No discharge.     Extraocular Movements: Extraocular movements intact.     Pupils: Pupils are equal, round, and reactive to light.  Cardiovascular:     Rate and Rhythm: Normal rate and regular rhythm.     Pulses: Normal pulses.     Heart sounds: Normal heart sounds. No murmur heard.    No friction rub. No gallop.  Pulmonary:     Effort: Pulmonary effort is normal. No respiratory distress.     Breath sounds: Normal breath sounds.     Comments: Lung sounds present and equal bilaterally Abdominal:     General: Abdomen is flat.  Bowel sounds are normal. There is no distension.     Palpations: Abdomen is soft.     Tenderness: There is no abdominal tenderness.  Musculoskeletal:        General: Tenderness present.     Comments: Able to flex extend at the ankles bilaterally.  Better ROM to left lower extremity.  Moves upper extremities without difficulty.  Tenderness over right lower ribs.  No contusion or crepitus.  Tenderness at the right knee with passive and active range of motion.  Tenderness at the right hip, no crepitus or bruising.  Skin:    General: Skin is warm and dry.     Coloration: Skin is not jaundiced.  Neurological:     Mental Status: She is alert. Mental status is at baseline.     Coordination: Coordination normal.     Comments: Cranial nerves III through XII are grossly intact, grip strength equal bilaterally.  Difficulty raising either lower extremity secondary to pain.     ED Results / Procedures / Treatments   Labs (all labs ordered are listed, but only abnormal results are displayed) Labs Reviewed - No data to display  EKG None  Radiology DG Chest 2 View  Result Date: 09/09/2021 CLINICAL DATA:  Fall EXAM: CHEST - 2 VIEW COMPARISON:  07/31/2019 FINDINGS: Lungs are clear.  No pleural effusion or pneumothorax. Cardiomegaly. Degenerative changes of the visualized thoracolumbar spine. IMPRESSION: Cardiomegaly. No evidence of acute cardiopulmonary disease. Electronically Signed   By: Julian Hy M.D.   On: 09/09/2021 04:03   DG Hip Unilat W or Wo Pelvis 2-3 Views Right  Result Date: 09/09/2021 CLINICAL DATA:  Fall EXAM: DG HIP (WITH OR WITHOUT PELVIS) 2-3V RIGHT COMPARISON:  None Available. FINDINGS: Suboptimal visualization due to difficulty with soft tissue penetration. No fracture or dislocation is seen. Bilateral hip joint spaces are preserved. Visualized bony pelvis appears intact. IMPRESSION: Negative. Electronically Signed   By: Julian Hy M.D.   On: 09/09/2021 04:02   DG  Knee Complete 4 Views Right  Result Date: 09/09/2021 CLINICAL DATA:  Fall EXAM: RIGHT KNEE - COMPLETE 4+ VIEW COMPARISON:  12/18/2012 FINDINGS: Severe tricompartmental degenerative changes, most prominent in the medial compartment. This is progressive from 2014. No fracture or dislocation is seen. Suspected small suprapatellar knee joint effusion. IMPRESSION: No fracture or dislocation is seen. Severe tricompartmental degenerative changes, as above. Electronically Signed   By: Julian Hy M.D.   On: 09/09/2021 04:01   CT Head Wo Contrast  Result Date: 09/09/2021 CLINICAL DATA:  Fall EXAM: CT HEAD WITHOUT CONTRAST  TECHNIQUE: Contiguous axial images were obtained from the base of the skull through the vertex without intravenous contrast. RADIATION DOSE REDUCTION: This exam was performed according to the departmental dose-optimization program which includes automated exposure control, adjustment of the mA and/or kV according to patient size and/or use of iterative reconstruction technique. COMPARISON:  01/30/2003 FINDINGS: Motion degraded images. Brain: No evidence of acute infarction, hemorrhage, hydrocephalus, extra-axial collection or mass lesion/mass effect. Subcortical white matter and periventricular small vessel ischemic changes. Vascular: Intracranial atherosclerosis. Skull: Normal. Negative for fracture or focal lesion. Sinuses/Orbits: The visualized paranasal sinuses are essentially clear. The mastoid air cells are unopacified. Other: None. IMPRESSION: Motion degraded images. No evidence of acute intracranial abnormality. Small vessel ischemic changes. Electronically Signed   By: Julian Hy M.D.   On: 09/09/2021 04:00    Procedures Procedures    Medications Ordered in ED Medications  fentaNYL (SUBLIMAZE) injection 50 mcg (50 mcg Intramuscular Given 09/09/21 0359)    ED Course/ Medical Decision Making/ A&P                            Medical Decision Making Amount and/or  Complexity of Data Reviewed Radiology: ordered.  Risk Prescription drug management.   Patient presents due to fall at home.  There were no prodromal symptoms and presentation is consistent with a mechanical fall.  I considered labs but I do not think indicated.    On exam there are no focal deficits, lung sounds present bilaterally.  I do not appreciate any signs of a basilar skull fracture, she is also mentating well. Pulses in tact bilaterally  She has some chronic right knee and right hip pains that somewhat contribute to her care.  These appear to be more acute given the recent fall.  Though she has decreased ROM this is difficult to compare as it is also present at baseline.    I ordered and viewed the imaging as documented in ED course.  CT head and C-spine are negative for any acute process.  I also reviewed the plain films of the knee and hip which are somewhat limited to body habitus but did not show any acute signs of a fracture.  Negative for any acute fractures or dislocations.  No rib fractures, no pneumothorax.  Patient work-up initially thought patient be appropriate for discharge home.  She is unable to bear weight and walk, she is normally able to ambulate with a walker.  I will proceed with CT imaging of hip and knee to evaluate for possible occult fracture.  Disposition is pending reevaluation and CT scan results.  If the CT scans are negative for any acute process patient is unable to bear weight she will need physical therapy and OT evaluation prior to returning home. Case discussed with PA McCauley at shift change. See his note for final discharge.   Discussed HPI, physical exam and plan of care for this patient with attending Thayer Jew. The attending physician evaluated this patient as part of a shared visit and agrees with plan of care.     Final Clinical Impression(s) / ED Diagnoses Final diagnoses:  Fall, initial encounter    Rx / DC Orders ED Discharge  Orders     None         Sherrill Raring, PA-C 09/09/21 0447    Sherrill Raring, PA-C 09/09/21 1062    Dina Rich, Barbette Hair, MD 09/16/21 [phone]

## 2021-09-09 NOTE — Discharge Instructions (Signed)
You were seen today in the emergency department after a fall.  Your scans do not show any acute fractures or dislocations, there is also no bleeding in your head.  Very reassuring.  Use the walker to ambulate, follow-up with your primary this week for reevaluation.  I put in a home health order so you should receive a call from our social team to follow-up regarding possible evaluation for physical therapy for gait training.  talk with their primary again about possibly restarting physical therapy.  If you start having new symptoms or pain in other parts of your body that we did not image today return back to ED for further evaluation.

## 2021-09-10 DIAGNOSIS — Z794 Long term (current) use of insulin: Secondary | ICD-10-CM

## 2021-09-10 DIAGNOSIS — I10 Essential (primary) hypertension: Secondary | ICD-10-CM | POA: Diagnosis not present

## 2021-09-10 DIAGNOSIS — E119 Type 2 diabetes mellitus without complications: Secondary | ICD-10-CM | POA: Diagnosis not present

## 2021-09-10 DIAGNOSIS — N184 Chronic kidney disease, stage 4 (severe): Secondary | ICD-10-CM

## 2021-09-10 DIAGNOSIS — E875 Hyperkalemia: Secondary | ICD-10-CM | POA: Diagnosis not present

## 2021-09-10 DIAGNOSIS — I5032 Chronic diastolic (congestive) heart failure: Secondary | ICD-10-CM

## 2021-09-10 DIAGNOSIS — G4733 Obstructive sleep apnea (adult) (pediatric): Secondary | ICD-10-CM

## 2021-09-10 LAB — COMPREHENSIVE METABOLIC PANEL
ALT: 27 U/L (ref 0–44)
AST: 29 U/L (ref 15–41)
Albumin: 3.4 g/dL — ABNORMAL LOW (ref 3.5–5.0)
Alkaline Phosphatase: 114 U/L (ref 38–126)
Anion gap: 5 (ref 5–15)
BUN: 37 mg/dL — ABNORMAL HIGH (ref 8–23)
CO2: 19 mmol/L — ABNORMAL LOW (ref 22–32)
Calcium: 8.6 mg/dL — ABNORMAL LOW (ref 8.9–10.3)
Chloride: 113 mmol/L — ABNORMAL HIGH (ref 98–111)
Creatinine, Ser: 1.77 mg/dL — ABNORMAL HIGH (ref 0.44–1.00)
GFR, Estimated: 30 mL/min — ABNORMAL LOW (ref >60)
Glucose, Bld: 126 mg/dL — ABNORMAL HIGH (ref 70–99)
Potassium: 5.6 mmol/L — ABNORMAL HIGH (ref 3.5–5.1)
Sodium: 137 mmol/L (ref 135–145)
Total Bilirubin: 0.7 mg/dL (ref 0.3–1.2)
Total Protein: 7.8 g/dL (ref 6.5–8.1)

## 2021-09-10 LAB — GLUCOSE, CAPILLARY
Glucose-Capillary: 167 mg/dL — ABNORMAL HIGH (ref 70–99)
Glucose-Capillary: 135 mg/dL — ABNORMAL HIGH (ref 70–99)
Glucose-Capillary: 144 mg/dL — ABNORMAL HIGH (ref 70–99)
Glucose-Capillary: 130 mg/dL — ABNORMAL HIGH (ref 70–99)
Glucose-Capillary: 128 mg/dL — ABNORMAL HIGH (ref 70–99)
Glucose-Capillary: 129 mg/dL — ABNORMAL HIGH (ref 70–99)

## 2021-09-10 MED ORDER — SODIUM BICARBONATE 650 MG PO TABS
650.0000 mg | ORAL_TABLET | Freq: Two times a day (BID) | ORAL | Status: DC
  Administered 2021-09-10 – 2021-09-11 (×3): 650 mg via ORAL
  Filled 2021-09-10 (×3): qty 1

## 2021-09-10 MED ORDER — SODIUM ZIRCONIUM CYCLOSILICATE 10 G PO PACK
10.0000 g | PACK | Freq: Once | ORAL | Status: AC
Start: 2021-09-10 — End: 2021-09-10
  Administered 2021-09-10: 10 g via ORAL
  Filled 2021-09-10: qty 1

## 2021-09-10 MED ORDER — FLUCONAZOLE 150 MG PO TABS
150.0000 mg | ORAL_TABLET | Freq: Once | ORAL | Status: AC
Start: 2021-09-10 — End: 2021-09-10
  Administered 2021-09-10: 150 mg via ORAL
  Filled 2021-09-10: qty 1

## 2021-09-10 NOTE — NC FL2 (Signed)
[place] LEVEL OF CARE SCREENING TOOL     IDENTIFICATION  Patient Name: Kaitlin Berry Birthdate: [birth year] Sex: female Admission Date (Current Location): 09/09/2021  [facility] and [place] Number:  Herbalist and Address:  [facility],  [address], [place]      Provider Number: [id number]  Attending Physician Name and Address:  Aline August, MD  Relative Name and Phone Number:  Jamiya Nims, [phone]    Current Level of Care: Hospital Recommended Level of Care: [place] Prior Approval Number:    Date Approved/Denied:   PASRR Number: [id number]  Discharge Plan: SNF    Current Diagnoses: Patient Active Problem List   Diagnosis Date Noted   Hyperkalemia 09/09/2021   Hyperlipidemia associated with type 2 diabetes mellitus ([place]) 05/30/2021   CKD (chronic kidney disease), stage III ([place]) 94/85/4627   Metabolic acidosis 03/50/0938   Acute lower UTI 07/31/2019   Insulin-requiring or dependent type II diabetes mellitus ([place]) 07/31/2019   Chronic diastolic heart failure ([place]) 08/19/2017   Hypothyroidism 12/07/2016   Essential hypertension 12/07/2016   DOE (dyspnea on exertion) 11/17/2016   Precordial pain 11/17/2016   Acute cystitis 11/27/2011   Urticaria 11/27/2011   Morbid obesity ([place]) 11/27/2011   Obesity 04/22/2011   OBSTRUCTIVE SLEEP APNEA 05/17/2007   DEPRESSION 03/24/2007   BACK PAIN 03/24/2007   HYPERSOMNIA 03/24/2007    Orientation RESPIRATION BLADDER Height & Weight     Self, Situation, Time, Place  Normal Continent Weight: 286 lb (129.7 kg) Height:  '5\' 1"'$  (154.9 cm)  BEHAVIORAL SYMPTOMS/MOOD NEUROLOGICAL BOWEL NUTRITION STATUS      Continent Diet (Regular)  AMBULATORY STATUS COMMUNICATION OF NEEDS Skin   Extensive Assist Verbally Normal                       Personal Care Assistance Level of Assistance  Bathing, Feeding, Dressing Bathing Assistance: Limited  assistance Feeding assistance: Independent Dressing Assistance: Limited assistance     Functional Limitations Info  Sight, Hearing, Speech Sight Info: Adequate Hearing Info: Adequate Speech Info: Adequate    SPECIAL CARE FACTORS FREQUENCY  PT (By licensed PT), OT (By licensed OT)     PT Frequency: 5x/wk OT Frequency: 5x/wk            Contractures Contractures Info: Not present    Additional Factors Info  Code Status, Allergies Code Status Info: FULL Allergies Info: Atorvastatin,Ciprofloxacin,Ciprofloxacin Hcl,Lisinopril Psychotropic Info: See MAR         Current Medications (09/10/2021):  This is the current hospital active medication list Current Facility-Administered Medications  Medication Dose Route Frequency Provider Last Rate Last Admin   acetaminophen (TYLENOL) tablet 650 mg  650 mg Oral Q6H PRN Reubin Milan, MD   650 mg at 09/09/21 2130   Or   acetaminophen (TYLENOL) suppository 650 mg  650 mg Rectal Q6H PRN Reubin Milan, MD       carvedilol (COREG) tablet 6.25 mg  6.25 mg Oral BID Reubin Milan, MD   6.25 mg at 09/09/21 2129   citalopram (CELEXA) tablet 20 mg  20 mg Oral Daily Reubin Milan, MD       enoxaparin (LOVENOX) injection 40 mg  40 mg Subcutaneous Q24H Reubin Milan, MD   40 mg at 09/09/21 2130   insulin aspart (novoLOG) injection 0-20 Units  0-20 Units Subcutaneous TID WC Reubin Milan, MD   3 Units at 09/10/21 [id number]  insulin aspart (novoLOG) injection 20 Units  20 Units Subcutaneous TID WC Reubin Milan, MD       insulin aspart (novoLOG) injection 50 Units  50 Units Subcutaneous TID WC Reubin Milan, MD       insulin glargine-yfgn [facility]) injection 70 Units  70 Units Subcutaneous Daily Suzzanne Cloud, RPH   70 Units at 09/09/21 1818   ipratropium-albuterol (DUONEB) 0.5-2.5 (3) MG/3ML nebulizer solution 3 mL  3 mL Nebulization Q4H PRN Reubin Milan, MD   3 mL at 09/10/21 0544   nystatin  (MYCOSTATIN/NYSTOP) topical powder   Topical TID Reubin Milan, MD   Given at 09/09/21 2248   ondansetron (ZOFRAN) tablet 4 mg  4 mg Oral Q6H PRN Reubin Milan, MD       Or   ondansetron [facility]) injection 4 mg  4 mg Intravenous Q6H PRN Reubin Milan, MD       oxyCODONE (Oxy IR/ROXICODONE) immediate release tablet 10 mg  10 mg Oral Q4H PRN Reubin Milan, MD   10 mg at 09/10/21 0535   triamcinolone cream (KENALOG) 0.1 % cream 1 Application  1 Application Topical Daily PRN Reubin Milan, MD       vitamin B-12 (CYANOCOBALAMIN) tablet 1,000 mcg  1,000 mcg Oral Daily Reubin Milan, MD   1,000 mcg at 09/09/21 1932     Discharge Medications: Please see discharge summary for a list of discharge medications.  Relevant Imaging Results:  Relevant Lab Results:   Additional Information SSN: [id number]  Vassie Moselle, LCSW

## 2021-09-10 NOTE — Progress Notes (Signed)
PROGRESS NOTE    Kaitlin Berry  ZOX:[id number] DOB: [birth year] DOA: 09/09/2021 PCP: Christain Sacramento, MD   Brief Narrative:  76 y.o. female with medical history significant of anxiety, osteoarthritis of the right knee, chronic back pain, depression, type 2 diabetes, morbid obesity, GERD, IBS, hyperlipidemia, hypersomnia, hypertension, obstructive sleep apnea on CPAP, psoriasis, urticaria who had a fall at home while rushing to the bathroom due to having diarrhea secondary to her IBS.  She fell and hit her knee and was in severe right knee pain.  On presentation, potassium was 6.5, bicarb of 17, creatinine of 1.77 (close to baseline) chest x-ray was negative for acute finding.  Right knee/right hip x-rays, CT head, CT right knee/right hip were negative for acute fractures.  She was treated with IV sodium bicarbonate/insulin/calcium gluconate in the ED and subsequently also received Lokelma.  Assessment & Plan:   Hyperkalemia -Presented with 6.5 potassium, subsequent potassium was 6.8.  Received IV sodium bicarbonate/insulin/calcium gluconate in the ED and subsequently also received Lokelma. -Potassium 5.6 this morning.  We will give 1 more dose of Lokelma today. -ARB/spironolactone on hold -Repeat a.m. labs  CKD stage IV Chronic metabolic acidosis -Creatinine at baseline.  Bicarb of 19 today.  We will treat with oral sodium bicarbonate tablets.  Repeat a.m. labs.  Outpatient follow-up with nephrology/Dr. Gean Quint  Chronic diastolic heart failure -Compensated.  Strict input and output.  Daily weights.  Continue Coreg.  Losartan and spironolactone on hold.  Outpatient follow-up with cardiology  Hypertension -Blood pressure on the high side.  Monitor.  Continue Coreg.  Morbid obesity -Outpatient follow-up  Diabetes mellitus type 2 -Carb modified diet.  Continue long-acting insulin along with CBGs with SSI and NovoLog with meals  OSA -Continue CPAP at bedtime  Fall at home Chronic  knee pain -No fractures on imaging.  PT recommended SNF placement.  TOC following.   DVT prophylaxis: Lovenox Code Status: Full Family Communication: Husband at bedside Disposition Plan: Status is: Observation The patient will require care spanning > 2 midnights and should be moved to inpatient because: Of hyperkalemia.  Need for SNF placement   Consultants: None  Procedures: None  Antimicrobials: None   Subjective: Patient seen and examined at bedside.  Complains of some right knee pain.  No overnight fever, nausea, vomiting or chest pain reported.  Objective: Vitals:   09/10/21 0111 09/10/21 0522 09/10/21 0522 09/10/21 1026  BP: (!) 168/54 (!) 196/85 (!) 196/85 (!) 161/60  Pulse: 73 75 75 73  Resp: '19 16 16   '$ Temp: 97.6 F (36.4 C) 98 F (36.7 C) 98 F (36.7 C)   TempSrc:  Oral    SpO2: 95% 97% 97% 93%  Weight:      Height:        Intake/Output Summary (Last 24 hours) at 09/10/2021 1043 Last data filed at 09/09/2021 2258 Gross per 24 hour  Intake 1043.7 ml  Output 600 ml  Net 443.7 ml   Filed Weights   09/09/21 0221  Weight: 129.7 kg    Examination:  General exam: Appears calm and comfortable.  Looks chronically ill and deconditioned.  Currently on room air. Respiratory system: Bilateral decreased breath sounds at bases with some scattered crackles Cardiovascular system: S1 & S2 heard, Rate controlled Gastrointestinal system: Abdomen is morbidly obese, nondistended, soft and nontender. Normal bowel sounds heard. Extremities: No cyanosis, clubbing; mild lower extremity edema present  Central nervous system: Alert and oriented. No focal neurological deficits. Moving extremities Skin: No  rashes, lesions or ulcers Psychiatry: Mostly flat affect.  No signs of agitation.   Data Reviewed: I have personally reviewed following labs and imaging studies  CBC: Recent Labs  Lab 09/09/21 1252  WBC 10.4  NEUTROABS 6.9  HGB 12.1  HCT 39.0  MCV 98.2  PLT 680    Basic Metabolic Panel: Recent Labs  Lab 09/09/21 1252 09/09/21 2024 09/10/21 0545  NA 137 137 137  K 6.5* 6.8* 5.6*  CL 114* 115* 113*  CO2 17* 16* 19*  GLUCOSE 155* 185* 126*  BUN 39* 37* 37*  CREATININE 1.77* 1.89* 1.77*  CALCIUM 8.8* 8.5* 8.6*   GFR: Estimated Creatinine Clearance: 34.9 mL/min (A) (by C-G formula based on SCr of 1.77 mg/dL (H)). Liver Function Tests: Recent Labs  Lab 09/10/21 0545  AST 29  ALT 27  ALKPHOS 114  BILITOT 0.7  PROT 7.8  ALBUMIN 3.4*   No results for input(s): "LIPASE", "AMYLASE" in the last 168 hours. No results for input(s): "AMMONIA" in the last 168 hours. Coagulation Profile: No results for input(s): "INR", "PROTIME" in the last 168 hours. Cardiac Enzymes: No results for input(s): "CKTOTAL", "CKMB", "CKMBINDEX", "TROPONINI" in the last 168 hours. BNP (last 3 results) No results for input(s): "PROBNP" in the last 8760 hours. HbA1C: No results for input(s): "HGBA1C" in the last 72 hours. CBG: Recent Labs  Lab 09/09/21 1726 09/09/21 1826 09/09/21 2225 09/10/21 0801 09/10/21 1020  GLUCAP 167* 163* 143* 135* 144*   Lipid Profile: No results for input(s): "CHOL", "HDL", "LDLCALC", "TRIG", "CHOLHDL", "LDLDIRECT" in the last 72 hours. Thyroid Function Tests: No results for input(s): "TSH", "T4TOTAL", "FREET4", "T3FREE", "THYROIDAB" in the last 72 hours. Anemia Panel: No results for input(s): "VITAMINB12", "FOLATE", "FERRITIN", "TIBC", "IRON", "RETICCTPCT" in the last 72 hours. Sepsis Labs: No results for input(s): "PROCALCITON", "LATICACIDVEN" in the last 168 hours.  Recent Results (from the past 240 hour(s))  Resp Panel by RT-PCR (Flu A&B, Covid)     Status: None   Collection Time: 09/09/21 12:52 PM   Specimen: Nasal Swab  Result Value Ref Range Status   SARS Coronavirus 2 by RT PCR NEGATIVE NEGATIVE Final    Comment: (NOTE) SARS-CoV-2 target nucleic acids are NOT DETECTED.  The SARS-CoV-2 RNA is generally detectable  in upper respiratory specimens during the acute phase of infection. The lowest concentration of SARS-CoV-2 viral copies this assay can detect is 138 copies/mL. A negative result does not preclude SARS-Cov-2 infection and should not be used as the sole basis for treatment or other patient management decisions. A negative result may occur with  improper specimen collection/handling, submission of specimen other than nasopharyngeal swab, presence of viral mutation(s) within the areas targeted by this assay, and inadequate number of viral copies(<138 copies/mL). A negative result must be combined with clinical observations, patient history, and epidemiological information. The expected result is Negative.  Fact Sheet for Patients:  EntrepreneurPulse.com.au  Fact Sheet for Healthcare Providers:  IncredibleEmployment.be  This test is no t yet approved or cleared by the [place] FDA and  has been authorized for detection and/or diagnosis of SARS-CoV-2 by FDA under an Emergency Use Authorization (EUA). This EUA will remain  in effect (meaning this test can be used) for the duration of the COVID-19 declaration under Section 564(b)(1) of the Act, 21 U.S.C.section 360bbb-3(b)(1), unless the authorization is terminated  or revoked sooner.       Influenza A by PCR NEGATIVE NEGATIVE Final   Influenza B by PCR NEGATIVE NEGATIVE Final  Comment: (NOTE) The Xpert Xpress SARS-CoV-2/FLU/RSV plus assay is intended as an aid in the diagnosis of influenza from Nasopharyngeal swab specimens and should not be used as a sole basis for treatment. Nasal washings and aspirates are unacceptable for Xpert Xpress SARS-CoV-2/FLU/RSV testing.  Fact Sheet for Patients: EntrepreneurPulse.com.au  Fact Sheet for Healthcare Providers: IncredibleEmployment.be  This test is not yet approved or cleared by the [place] FDA and has  been authorized for detection and/or diagnosis of SARS-CoV-2 by FDA under an Emergency Use Authorization (EUA). This EUA will remain in effect (meaning this test can be used) for the duration of the COVID-19 declaration under Section 564(b)(1) of the Act, 21 U.S.C. section 360bbb-3(b)(1), unless the authorization is terminated or revoked.  Performed at [facility], [place] [address], [place], [place] [address]          Radiology Studies: CT Hip Right Wo Contrast  Result Date: 09/09/2021 CLINICAL DATA:  Hip trauma, pain EXAM: CT OF THE RIGHT HIP WITHOUT CONTRAST TECHNIQUE: Multidetector CT imaging of the right hip was performed according to the standard protocol. Multiplanar CT image reconstructions were also generated. RADIATION DOSE REDUCTION: This exam was performed according to the departmental dose-optimization program which includes automated exposure control, adjustment of the mA and/or kV according to patient size and/or use of iterative reconstruction technique. COMPARISON:  None Available. FINDINGS: Bones/Joint/Cartilage Generalized osteopenia. No acute fracture or dislocation. Mild osteoarthritis of the right hip. Normal alignment. No joint effusion. Ligaments Ligaments are suboptimally evaluated by CT. Muscles and Tendons Muscles are normal. No muscle atrophy. No intramuscular fluid collection or hematoma. Soft tissue No fluid collection or hematoma. No soft tissue mass. Peripheral vascular atherosclerotic disease. IMPRESSION: 1. No acute fracture or dislocation of the right hip. 2. Mild osteoarthritis of the right hip. Electronically Signed   By: Kathreen Devoid M.D.   On: 09/09/2021 08:30   CT Knee Right Wo Contrast  Result Date: 09/09/2021 CLINICAL DATA:  Right knee pain.  Knee trauma. EXAM: CT OF THE RIGHT KNEE WITHOUT CONTRAST TECHNIQUE: Multidetector CT imaging of the right knee was performed according to the standard protocol. Multiplanar CT image  reconstructions were also generated. RADIATION DOSE REDUCTION: This exam was performed according to the departmental dose-optimization program which includes automated exposure control, adjustment of the mA and/or kV according to patient size and/or use of iterative reconstruction technique. COMPARISON:  None Available. FINDINGS: Bones/Joint/Cartilage Generalized osteopenia. No acute fracture or dislocation. No aggressive osseous lesion. Normal alignment. Large joint effusion. Severe medial femorotibial compartment osteoarthritis with a bone-on-bone appearance, subchondral cystic changes and marginal osteophytes. Moderate-severe osteoarthritis of the lateral femorotibial compartment with small marginal osteophytes. Mild osteoarthritis of the patellofemoral compartment with small marginal osteophytes. Multiple loose bodies throughout the knee joint space. Ligaments Ligaments are suboptimally evaluated by CT. Muscles and Tendons Muscles are normal. No muscle atrophy. No intramuscular fluid collection or hematoma. Patellar tendon and quadriceps tendon are intact. Soft tissue No fluid collection or hematoma. No soft tissue mass. Peripheral vascular atherosclerotic disease. IMPRESSION: 1.  No acute osseous injury of the right knee. 2. Tricompartmental osteoarthritis of the right knee most severe in the medial femorotibial compartment. Electronically Signed   By: Kathreen Devoid M.D.   On: 09/09/2021 08:27   DG Chest 2 View  Result Date: 09/09/2021 CLINICAL DATA:  Fall EXAM: CHEST - 2 VIEW COMPARISON:  07/31/2019 FINDINGS: Lungs are clear.  No pleural effusion or pneumothorax. Cardiomegaly. Degenerative changes of the visualized thoracolumbar spine. IMPRESSION: Cardiomegaly. No  evidence of acute cardiopulmonary disease. Electronically Signed   By: Julian Hy M.D.   On: 09/09/2021 04:03   DG Hip Unilat W or Wo Pelvis 2-3 Views Right  Result Date: 09/09/2021 CLINICAL DATA:  Fall EXAM: DG HIP (WITH OR WITHOUT  PELVIS) 2-3V RIGHT COMPARISON:  None Available. FINDINGS: Suboptimal visualization due to difficulty with soft tissue penetration. No fracture or dislocation is seen. Bilateral hip joint spaces are preserved. Visualized bony pelvis appears intact. IMPRESSION: Negative. Electronically Signed   By: Julian Hy M.D.   On: 09/09/2021 04:02   DG Knee Complete 4 Views Right  Result Date: 09/09/2021 CLINICAL DATA:  Fall EXAM: RIGHT KNEE - COMPLETE 4+ VIEW COMPARISON:  12/18/2012 FINDINGS: Severe tricompartmental degenerative changes, most prominent in the medial compartment. This is progressive from 2014. No fracture or dislocation is seen. Suspected small suprapatellar knee joint effusion. IMPRESSION: No fracture or dislocation is seen. Severe tricompartmental degenerative changes, as above. Electronically Signed   By: Julian Hy M.D.   On: 09/09/2021 04:01   CT Head Wo Contrast  Result Date: 09/09/2021 CLINICAL DATA:  Fall EXAM: CT HEAD WITHOUT CONTRAST TECHNIQUE: Contiguous axial images were obtained from the base of the skull through the vertex without intravenous contrast. RADIATION DOSE REDUCTION: This exam was performed according to the departmental dose-optimization program which includes automated exposure control, adjustment of the mA and/or kV according to patient size and/or use of iterative reconstruction technique. COMPARISON:  01/30/2003 FINDINGS: Motion degraded images. Brain: No evidence of acute infarction, hemorrhage, hydrocephalus, extra-axial collection or mass lesion/mass effect. Subcortical white matter and periventricular small vessel ischemic changes. Vascular: Intracranial atherosclerosis. Skull: Normal. Negative for fracture or focal lesion. Sinuses/Orbits: The visualized paranasal sinuses are essentially clear. The mastoid air cells are unopacified. Other: None. IMPRESSION: Motion degraded images. No evidence of acute intracranial abnormality. Small vessel ischemic changes.  Electronically Signed   By: Julian Hy M.D.   On: 09/09/2021 04:00        Scheduled Meds:  carvedilol  6.25 mg Oral BID   citalopram  20 mg Oral Daily   enoxaparin (LOVENOX) injection  40 mg Subcutaneous Q24H   insulin aspart  0-20 Units Subcutaneous TID WC   insulin aspart  20 Units Subcutaneous TID WC   insulin aspart  50 Units Subcutaneous TID WC   insulin glargine-yfgn  70 Units Subcutaneous Daily   nystatin   Topical TID   vitamin B-12  1,000 mcg Oral Daily   Continuous Infusions:        Aline August, MD [facility] 09/10/2021, 10:43 AM

## 2021-09-10 NOTE — TOC Initial Note (Addendum)
Transition of Care [facility]) - Initial/Assessment Note    Patient Details  Name: Kaitlin Berry MRN: [id number] Date of Birth: [birth year]  [facility] CM/SW Contact:    Vassie Moselle, LCSW Phone Number: 09/10/2021, 9:13 AM  Clinical Narrative:                 Pt has been referred out for SNF placement. Currently awaiting bed offers.   Update 1255: Met with pt to review bed offers for SNF. Pt requested CSW contact spouse to review bed offers with him. CSW left voicemail for pt's spouse. Currently awaiting return call.   Update 1530: CSW met with pt and obtained additional phone number for her spouse at [phone]. CSW attempted to reach pt's husband and left voicemail requesting return call.   Expected Discharge Plan: Skilled Nursing Facility Barriers to Discharge: Continued Medical Work up   Patient Goals and CMS Choice Patient states their goals for this hospitalization and ongoing recovery are:: To return home   Choice offered to / list presented to : Patient, Spouse  Expected Discharge Plan and Services Expected Discharge Plan: [place] In-house Referral: Clinical Social Work Discharge Planning Services: CM Consult Post Acute Care Choice: [place] arrangements for the past 2 months: Single Family Home                 DME Arranged: N/A DME Agency: NA                  Prior Living Arrangements/Services Living arrangements for the past 2 months: [place] Lives with:: Spouse Patient language and need for interpreter reviewed:: Yes Do you feel safe going back to the place where you live?: Yes      Need for Family Participation in Patient Care: No (Comment) Care giver support system in place?: No (comment) Current home services: DME Criminal Activity/Legal Involvement Pertinent to Current Situation/Hospitalization: No - Comment as needed  Activities of Daily Living Home Assistive Devices/Equipment:  Environmental consultant (specify type), Eyeglasses, Shower chair with back (stair lift) ADL Screening (condition at time of admission) Patient's cognitive ability adequate to safely complete daily activities?: Yes Is the patient deaf or have difficulty hearing?: No Does the patient have difficulty seeing, even when wearing glasses/contacts?: No Does the patient have difficulty concentrating, remembering, or making decisions?: Yes (some memory problems) Patient able to express need for assistance with ADLs?: Yes Does the patient have difficulty dressing or bathing?: Yes Independently performs ADLs?: No Communication: Independent Dressing (OT): Independent Grooming: Independent Feeding: Independent Bathing: Independent Toileting: Needs assistance Is this a change from baseline?: Change from baseline, expected to last >3days In/Out Bed: Needs assistance Is this a change from baseline?: Change from baseline, expected to last >3 days Walks in Home: Needs assistance Is this a change from baseline?: Change from baseline, expected to last >3 days Does the patient have difficulty walking or climbing stairs?: Yes Weakness of Legs: Both Weakness of Arms/Hands: Both  Permission Sought/Granted Permission sought to share information with : Facility Sport and exercise psychologist, Family Supports Permission granted to share information with : Yes, Verbal Permission Granted  Share Information with NAME: Maisha Bogen     Permission granted to share info w Relationship: Spouse  Permission granted to share info w Contact Information: [phone]  Emotional Assessment       Orientation: : Oriented to Self, Oriented to Place, Oriented to  Time, Oriented to Situation Alcohol / Substance Use: Not Applicable Psych Involvement:  No (comment)  Admission diagnosis:  Hyperkalemia [E87.5] Weakness [R53.1] Fall, initial encounter [W19.XXXA] Patient Active Problem List   Diagnosis Date Noted   Hyperkalemia 09/09/2021    Hyperlipidemia associated with type 2 diabetes mellitus ([place]) 05/30/2021   CKD (chronic kidney disease), stage III ([place]) 65/53/7482   Metabolic acidosis 70/78/6754   Acute lower UTI 07/31/2019   Insulin-requiring or dependent type II diabetes mellitus ([place]) 07/31/2019   Chronic diastolic heart failure ([place]) 08/19/2017   Hypothyroidism 12/07/2016   Essential hypertension 12/07/2016   DOE (dyspnea on exertion) 11/17/2016   Precordial pain 11/17/2016   Acute cystitis 11/27/2011   Urticaria 11/27/2011   Morbid obesity ([place]) 11/27/2011   Obesity 04/22/2011   OBSTRUCTIVE SLEEP APNEA 05/17/2007   DEPRESSION 03/24/2007   BACK PAIN 03/24/2007   HYPERSOMNIA 03/24/2007   PCP:  Christain Sacramento, MD Pharmacy:   [id number] [address], [place]. [place] [address] Phone: [phone] Fax: [phone]  [place] [facility] [place], [place] [address] Phone: [phone] Fax: [phone], [place], [address] [place] [address] Phone: [phone] Fax: [phone]     Social Determinants of Health (SDOH) Interventions    Readmission Risk Interventions     No data to display

## 2021-09-10 NOTE — Inpatient Diabetes Management (Signed)
Inpatient Diabetes Program Recommendations  AACE/ADA: New Consensus Statement on Inpatient Glycemic Control (2015)  Target Ranges:  Prepandial:   less than 140 mg/dL      Peak postprandial:   less than 180 mg/dL (1-2 hours)      Critically ill patients:  140 - 180 mg/dL   Lab Results  Component Value Date   GLUCAP 130 (H) 09/10/2021   HGBA1C 7.8 (H) 08/13/2021    Review of Glycemic Control  Latest Reference Range & Units 09/10/21 08:01 09/10/21 10:20 09/10/21 11:28  Glucose-Capillary 70 - 99 mg/dL 135 (H) 144 (H) 130 (H)  (H): Data is abnormally high Diabetes history: Type 2 DM Outpatient Diabetes medications: Trulicity 4.16 mg qwk, Fiasp 20-70 units TID, Tresiba 70 units QD Current orders for Inpatient glycemic control: Semglee 70 units QD, Novolog 0-20 units TID, Novolog 50 units TID, Novolog 20 units TID  Inpatient Diabetes Program Recommendations:    Discontinue Novolog 50 units TID & Novolog 20 units TID.  Spoke with patient to verify home medications (see above). Patient injects dose based on meal intake. Denies checking blood sugar. Reviewed patient's current A1c of 7.8%. Explained what a A1c is and what it measures. Also reviewed goal A1c with patient, importance of good glucose control @ home, and blood sugar goals. Encouraged to check blood sugars and reviewed recommended frequency. Patient has not had a good appetite and not eating well.  Patient in agreement with recommendations. Secure chat sent to MD.   Thanks, Bronson Curb, MSN, RNC-OB Diabetes Coordinator [phone] (8a-5p)

## 2021-09-11 DIAGNOSIS — Z6841 Body Mass Index (BMI) 40.0 and over, adult: Secondary | ICD-10-CM | POA: Diagnosis not present

## 2021-09-11 DIAGNOSIS — N390 Urinary tract infection, site not specified: Secondary | ICD-10-CM | POA: Diagnosis present

## 2021-09-11 DIAGNOSIS — I10 Essential (primary) hypertension: Secondary | ICD-10-CM | POA: Diagnosis not present

## 2021-09-11 DIAGNOSIS — K219 Gastro-esophageal reflux disease without esophagitis: Secondary | ICD-10-CM | POA: Diagnosis present

## 2021-09-11 DIAGNOSIS — Z881 Allergy status to other antibiotic agents status: Secondary | ICD-10-CM | POA: Diagnosis not present

## 2021-09-11 DIAGNOSIS — G4710 Hypersomnia, unspecified: Secondary | ICD-10-CM | POA: Diagnosis present

## 2021-09-11 DIAGNOSIS — N184 Chronic kidney disease, stage 4 (severe): Secondary | ICD-10-CM | POA: Diagnosis present

## 2021-09-11 DIAGNOSIS — E8722 Chronic metabolic acidosis: Secondary | ICD-10-CM | POA: Diagnosis present

## 2021-09-11 DIAGNOSIS — Z833 Family history of diabetes mellitus: Secondary | ICD-10-CM | POA: Diagnosis not present

## 2021-09-11 DIAGNOSIS — K580 Irritable bowel syndrome with diarrhea: Secondary | ICD-10-CM | POA: Diagnosis present

## 2021-09-11 DIAGNOSIS — E1122 Type 2 diabetes mellitus with diabetic chronic kidney disease: Secondary | ICD-10-CM | POA: Diagnosis present

## 2021-09-11 DIAGNOSIS — M1711 Unilateral primary osteoarthritis, right knee: Secondary | ICD-10-CM | POA: Diagnosis present

## 2021-09-11 DIAGNOSIS — G4733 Obstructive sleep apnea (adult) (pediatric): Secondary | ICD-10-CM | POA: Diagnosis present

## 2021-09-11 DIAGNOSIS — G8929 Other chronic pain: Secondary | ICD-10-CM | POA: Diagnosis present

## 2021-09-11 DIAGNOSIS — M542 Cervicalgia: Secondary | ICD-10-CM | POA: Diagnosis present

## 2021-09-11 DIAGNOSIS — M549 Dorsalgia, unspecified: Secondary | ICD-10-CM | POA: Diagnosis present

## 2021-09-11 DIAGNOSIS — I5032 Chronic diastolic (congestive) heart failure: Secondary | ICD-10-CM | POA: Diagnosis present

## 2021-09-11 DIAGNOSIS — E039 Hypothyroidism, unspecified: Secondary | ICD-10-CM | POA: Diagnosis present

## 2021-09-11 DIAGNOSIS — E785 Hyperlipidemia, unspecified: Secondary | ICD-10-CM | POA: Diagnosis present

## 2021-09-11 DIAGNOSIS — F32A Depression, unspecified: Secondary | ICD-10-CM | POA: Diagnosis present

## 2021-09-11 DIAGNOSIS — E875 Hyperkalemia: Secondary | ICD-10-CM | POA: Diagnosis present

## 2021-09-11 DIAGNOSIS — Z20822 Contact with and (suspected) exposure to covid-19: Secondary | ICD-10-CM | POA: Diagnosis present

## 2021-09-11 DIAGNOSIS — I130 Hypertensive heart and chronic kidney disease with heart failure and stage 1 through stage 4 chronic kidney disease, or unspecified chronic kidney disease: Secondary | ICD-10-CM | POA: Diagnosis present

## 2021-09-11 DIAGNOSIS — Z888 Allergy status to other drugs, medicaments and biological substances status: Secondary | ICD-10-CM | POA: Diagnosis not present

## 2021-09-11 LAB — BASIC METABOLIC PANEL
Anion gap: 6 (ref 5–15)
BUN: 38 mg/dL — ABNORMAL HIGH (ref 8–23)
CO2: 19 mmol/L — ABNORMAL LOW (ref 22–32)
Calcium: 8.6 mg/dL — ABNORMAL LOW (ref 8.9–10.3)
Chloride: 112 mmol/L — ABNORMAL HIGH (ref 98–111)
Creatinine, Ser: 2 mg/dL — ABNORMAL HIGH (ref 0.44–1.00)
GFR, Estimated: 26 mL/min — ABNORMAL LOW (ref >60)
Glucose, Bld: 95 mg/dL (ref 70–99)
Potassium: 5.4 mmol/L — ABNORMAL HIGH (ref 3.5–5.1)
Sodium: 137 mmol/L (ref 135–145)

## 2021-09-11 LAB — MAGNESIUM: Magnesium: 2.2 mg/dL (ref 1.7–2.4)

## 2021-09-11 LAB — GLUCOSE, CAPILLARY
Glucose-Capillary: 99 mg/dL (ref 70–99)
Glucose-Capillary: 164 mg/dL — ABNORMAL HIGH (ref 70–99)
Glucose-Capillary: 158 mg/dL — ABNORMAL HIGH (ref 70–99)

## 2021-09-11 MED ORDER — SODIUM BICARBONATE 650 MG PO TABS
650.0000 mg | ORAL_TABLET | Freq: Three times a day (TID) | ORAL | Status: DC
  Administered 2021-09-11 – 2021-09-14 (×9): 650 mg via ORAL
  Filled 2021-09-11 (×9): qty 1

## 2021-09-11 MED ORDER — SODIUM ZIRCONIUM CYCLOSILICATE 10 G PO PACK
10.0000 g | PACK | Freq: Once | ORAL | Status: AC
Start: 2021-09-11 — End: 2021-09-11
  Administered 2021-09-11: 10 g via ORAL
  Filled 2021-09-11: qty 1

## 2021-09-11 MED ORDER — LORATADINE 10 MG PO TABS
10.0000 mg | ORAL_TABLET | Freq: Every day | ORAL | Status: DC | PRN
  Administered 2021-09-11 – 2021-09-14 (×4): 10 mg via ORAL
  Filled 2021-09-11 (×4): qty 1

## 2021-09-11 MED ORDER — INSULIN GLARGINE-YFGN 100 UNIT/ML ~~LOC~~ SOLN
35.0000 [IU] | Freq: Every day | SUBCUTANEOUS | Status: DC
Start: 2021-09-12 — End: 2021-09-14
  Administered 2021-09-12 – 2021-09-14 (×3): 35 [IU] via SUBCUTANEOUS
  Filled 2021-09-11 (×3): qty 0.35

## 2021-09-11 NOTE — TOC Progression Note (Addendum)
Transition of Care [facility]) - Progression Note    Patient Details  Name: Kaitlin Berry MRN: [id number] Date of Birth: Jun 22, 1945  Transition of Care [facility]) CM/SW [place], LCSW Phone Number: 09/11/2021, 9:03 AM  Clinical Narrative:    Spoke with pt's spouse, Kaitlin Berry who states that [facility] SN[facility] is their top choice. CSW contacted [facility] however, they currently do not have bed availability. Pt's spouse is to review other bed offers prior to choosing SNF placement.   Update 1545: Pt/spouse accepted bed offer for [facility]. Insurance Josem Kaufmann has been started and currently awaiting approval.    Expected Discharge Plan: [place] Barriers to Discharge: Continued Medical Work up  Expected Discharge Plan and Services Expected Discharge Plan: [place] In-house Referral: Clinical Social Work Discharge Planning Services: CM Consult Post Acute Care Choice: [place] Living arrangements for the past 2 months: Single Family Home                 DME Arranged: N/A DME Agency: NA                   Social Determinants of Health (SDOH) Interventions    Readmission Risk Interventions     No data to display

## 2021-09-11 NOTE — Progress Notes (Signed)
Physical Therapy Treatment Patient Details Name: Kaitlin Berry MRN: [id number] DOB: [birth year] Today's Date: 09/11/2021   History of Present Illness Kaitlin Berry is 76 yo female  brought to ED 7/11 after fall at home, negative for fractures, significant arthritis right knee. Pt admitted with Hyperkalemia. PMH: OSA, obesity, chronic heart failure DM, anxiety and depression, arthritis.    PT Comments    Pt assisted with perform sit to stands from bed.  Pt unable to tolerate standing long and bed saturated with urine so utilized stedy for OOB to recliner.  Continue to recommend SNF upon d/c.    Recommendations for follow up therapy are one component of a multi-disciplinary discharge planning process, led by the attending physician.  Recommendations may be updated based on patient status, additional functional criteria and insurance authorization.  Follow Up Recommendations  Skilled nursing-short term rehab (<3 hours/day) Can patient physically be transported by private vehicle: No   Assistance Recommended at Discharge Frequent or constant Supervision/Assistance  Patient can return home with the following Two people to help with bathing/dressing/bathroom;Two people to help with walking and/or transfers;Assistance with cooking/housework;Assist for transportation;Help with stairs or ramp for entrance   Equipment Recommendations  None recommended by PT    Recommendations for Other Services       Precautions / Restrictions Precautions Precautions: Fall Precaution Comments: right knee does not fully extend     Mobility  Bed Mobility               General bed mobility comments: sitting EOB on arrival    Transfers Overall transfer level: Needs assistance Equipment used: Rolling walker (2 wheels) Transfers: Sit to/from Stand Sit to Stand: Mod assist, +2 physical assistance           General transfer comment: assist to rise and steady, pt briefly able to hold standing  however requested return to sitting due to pain in R LE, utilized Stedy for OOB to recliner, again mod +2 assist to stand with stedy    Ambulation/Gait                   Stairs             Wheelchair Mobility    Modified Rankin (Stroke Patients Only)       Balance Overall balance assessment: History of Falls         Standing balance support: Bilateral upper extremity supported, Reliant on assistive device for balance Standing balance-Leahy Scale: Poor                              Cognition Arousal/Alertness: Awake/alert Behavior During Therapy: WFL for tasks assessed/performed Overall Cognitive Status: Within Functional Limits for tasks assessed                                          Exercises      General Comments        Pertinent Vitals/Pain Pain Assessment Pain Assessment: 0-10 Pain Score: 6  Pain Location: right knee and right hip Pain Descriptors / Indicators: Aching, Discomfort, Tender Pain Intervention(s): Repositioned, Monitored during session    Home Living                          Prior Function  PT Goals (current goals can now be found in the care plan section) Progress towards PT goals: Progressing toward goals    Frequency    Min 2X/week      PT Plan Current plan remains appropriate    Co-evaluation              AM-PAC PT "6 Clicks" Mobility   Outcome Measure  Help needed turning from your back to your side while in a flat bed without using bedrails?: A Lot Help needed moving from lying on your back to sitting on the side of a flat bed without using bedrails?: A Lot Help needed moving to and from a bed to a chair (including a wheelchair)?: Total Help needed standing up from a chair using your arms (e.g., wheelchair or bedside chair)?: Total Help needed to walk in hospital room?: Total Help needed climbing 3-5 steps with a railing? : Total 6 Click Score:  8    End of Session Equipment Utilized During Treatment: Gait belt Activity Tolerance: Patient limited by fatigue;Patient limited by pain Patient left: in chair;with call bell/phone within reach;with chair alarm set   PT Visit Diagnosis: Muscle weakness (generalized) (M62.81);History of falling (Z91.81);Pain Pain - Right/Left: Right Pain - part of body: Leg;Knee     Time: 6629-4765 PT Time Calculation (min) (ACUTE ONLY): 17 min  Charges:  $Therapeutic Activity: 8-22 mins                     Jannette Spanner PT, DPT Physical Therapist Acute Rehabilitation Services Preferred contact method: Secure Chat Weekend Pager Only: [phone] Office: [place] 09/11/2021, 3:13 PM

## 2021-09-11 NOTE — Progress Notes (Signed)
Patient does not want to wear cpap tonight. Encouraged patient to call if she changes her mond

## 2021-09-11 NOTE — Progress Notes (Signed)
PROGRESS NOTE    Kaitlin Berry  NWG:[id number] DOB: 06-12-1945 DOA: 09/09/2021 PCP: Christain [place], MD   Brief Narrative:  76 y.o. female with medical history significant of anxiety, osteoarthritis of the right knee, chronic back pain, depression, type 2 diabetes, morbid obesity, GERD, IBS, hyperlipidemia, hypersomnia, hypertension, obstructive sleep apnea on CPAP, psoriasis, urticaria who had a fall at home while rushing to the bathroom due to having diarrhea secondary to her IBS.  She fell and hit her knee and was in severe right knee pain.  On presentation, potassium was 6.5, bicarb of 17, creatinine of 1.77 (close to baseline) chest x-ray was negative for acute finding.  Right knee/right hip x-rays, CT head, CT right knee/right hip were negative for acute fractures.  She was treated with IV sodium bicarbonate/insulin/calcium gluconate in the ED and subsequently also received Lokelma.  PT recommended SNF placement  Assessment & Plan:   Hyperkalemia -Presented with 6.5 potassium, subsequent potassium was 6.8.  Received IV sodium bicarbonate/insulin/calcium gluconate in the ED and subsequently also received Lokelma. -Potassium 5.4 this morning.  We will give 1 more dose of Lokelma today. -ARB/spironolactone on hold -Repeat a.m. labs  CKD stage IV Chronic metabolic acidosis -Creatinine at baseline.  Bicarb of 19 today.  Increase sodium bicarbonate to 650 mg 3 times a day.  Repeat a.m. labs.  Outpatient follow-up with nephrology/Dr. Gean Quint  Chronic diastolic heart failure -Compensated.  Strict input and output.  Daily weights.  Continue Coreg.  Losartan and spironolactone on hold.  Outpatient follow-up with cardiology  Hypertension -Blood pressure on the high side.  Monitor.  Continue Coreg.  Morbid obesity -Outpatient follow-up  Diabetes mellitus type 2 -Carb modified diet.  Continue long-acting insulin but decrease dose because of blood sugars intermittently being on the lower  side.  Continue CBGs with SSI and NovoLog with meals  OSA -Continue CPAP at bedtime  Fall at home Chronic knee pain -No fractures on imaging.  PT recommended SNF placement.  TOC following.   DVT prophylaxis: Lovenox Code Status: Full Family Communication: Husband at bedside Disposition Plan: Status is: inpatient because: Of hyperkalemia.  Need for SNF placement   Consultants: None  Procedures: None  Antimicrobials: None   Subjective: Patient seen and examined at bedside.  Complains of some right knee pain.  Denies worsening shortness of breath, fever, vomiting or chest pain. Objective: Vitals:   09/10/21 2116 09/10/21 2137 09/11/21 0520 09/11/21 0930  BP:  (!) 171/66 (!) 166/68 (!) 165/70  Pulse: 87 79 82 80  Resp:  (!) 21 20   Temp:  98.3 F (36.8 C) 98.1 F (36.7 C)   TempSrc:   Oral   SpO2: 92% 95% 96%   Weight:      Height:        Intake/Output Summary (Last 24 hours) at 09/11/2021 1143 Last data filed at 09/11/2021 0523 Gross per 24 hour  Intake 480 ml  Output 800 ml  Net -320 ml    Filed Weights   09/09/21 0221  Weight: 129.7 kg    Examination:  General: On 2 L oxygen via nasal cannula.  No distress.  Chronically ill and deconditioned. ENT/neck: No thyromegaly.  JVD is not elevated  respiratory: Decreased breath sounds at bases bilaterally with some crackles; no wheezing CVS: S1-S2 heard, rate controlled currently Abdominal: Soft, morbidly obese, nontender, slightly distended; no organomegaly, bowel sounds are heard Extremities: Trace lower extremity edema; no cyanosis  CNS: Awake and alert.  No focal neurologic  deficit.  Moves extremities Lymph: No obvious lymphadenopathy Skin: No obvious ecchymosis/lesions  psych: Currently not agitated.  Flat affect mostly      Data Reviewed: I have personally reviewed following labs and imaging studies  CBC: Recent Labs  Lab 09/09/21 1252  WBC 10.4  NEUTROABS 6.9  HGB 12.1  HCT 39.0  MCV 98.2   PLT 814    Basic Metabolic Panel: Recent Labs  Lab 09/09/21 1252 09/09/21 2024 09/10/21 0545 09/11/21 0531  NA 137 137 137 137  K 6.5* 6.8* 5.6* 5.4*  CL 114* 115* 113* 112*  CO2 17* 16* 19* 19*  GLUCOSE 155* 185* 126* 95  BUN 39* 37* 37* 38*  CREATININE 1.77* 1.89* 1.77* 2.00*  CALCIUM 8.8* 8.5* 8.6* 8.6*  MG  --   --   --  2.2    GFR: Estimated Creatinine Clearance: 30.9 mL/min (A) (by C-G formula based on SCr of 2 mg/dL (H)). Liver Function Tests: Recent Labs  Lab 09/10/21 0545  AST 29  ALT 27  ALKPHOS 114  BILITOT 0.7  PROT 7.8  ALBUMIN 3.4*    No results for input(s): "LIPASE", "AMYLASE" in the last 168 hours. No results for input(s): "AMMONIA" in the last 168 hours. Coagulation Profile: No results for input(s): "INR", "PROTIME" in the last 168 hours. Cardiac Enzymes: No results for input(s): "CKTOTAL", "CKMB", "CKMBINDEX", "TROPONINI" in the last 168 hours. BNP (last 3 results) No results for input(s): "PROBNP" in the last 8760 hours. HbA1C: No results for input(s): "HGBA1C" in the last 72 hours. CBG: Recent Labs  Lab 09/10/21 1128 09/10/21 1641 09/10/21 2127 09/11/21 0737 09/11/21 1125  GLUCAP 130* 128* 129* 99 164*    Lipid Profile: No results for input(s): "CHOL", "HDL", "LDLCALC", "TRIG", "CHOLHDL", "LDLDIRECT" in the last 72 hours. Thyroid Function Tests: No results for input(s): "TSH", "T4TOTAL", "FREET4", "T3FREE", "THYROIDAB" in the last 72 hours. Anemia Panel: No results for input(s): "VITAMINB12", "FOLATE", "FERRITIN", "TIBC", "IRON", "RETICCTPCT" in the last 72 hours. Sepsis Labs: No results for input(s): "PROCALCITON", "LATICACIDVEN" in the last 168 hours.  Recent Results (from the past 240 hour(s))  Resp Panel by RT-PCR (Flu A&B, Covid)     Status: None   Collection Time: 09/09/21 12:52 PM   Specimen: Nasal Swab  Result Value Ref Range Status   SARS Coronavirus 2 by RT PCR NEGATIVE NEGATIVE Final    Comment:  (NOTE) SARS-CoV-2 target nucleic acids are NOT DETECTED.  The SARS-CoV-2 RNA is generally detectable in upper respiratory specimens during the acute phase of infection. The lowest concentration of SARS-CoV-2 viral copies this assay can detect is 138 copies/mL. A negative result does not preclude SARS-Cov-2 infection and should not be used as the sole basis for treatment or other patient management decisions. A negative result may occur with  improper specimen collection/handling, submission of specimen other than nasopharyngeal swab, presence of viral mutation(s) within the areas targeted by this assay, and inadequate number of viral copies(<138 copies/mL). A negative result must be combined with clinical observations, patient history, and epidemiological information. The expected result is Negative.  Fact Sheet for Patients:  EntrepreneurPulse.com.au  Fact Sheet for Healthcare Providers:  IncredibleEmployment.be  This test is no t yet approved or cleared by the [place] FDA and  has been authorized for detection and/or diagnosis of SARS-CoV-2 by FDA under an Emergency Use Authorization (EUA). This EUA will remain  in effect (meaning this test can be used) for the duration of the COVID-19 declaration under Section 564(b)(1)  of the Act, 21 U.S.C.section 360bbb-3(b)(1), unless the authorization is terminated  or revoked sooner.       Influenza A by PCR NEGATIVE NEGATIVE Final   Influenza B by PCR NEGATIVE NEGATIVE Final    Comment: (NOTE) The Xpert Xpress SARS-CoV-2/FLU/RSV plus assay is intended as an aid in the diagnosis of influenza from Nasopharyngeal swab specimens and should not be used as a sole basis for treatment. Nasal washings and aspirates are unacceptable for Xpert Xpress SARS-CoV-2/FLU/RSV testing.  Fact Sheet for Patients: EntrepreneurPulse.com.au  Fact Sheet for Healthcare  Providers: IncredibleEmployment.be  This test is not yet approved or cleared by the [place] FDA and has been authorized for detection and/or diagnosis of SARS-CoV-2 by FDA under an Emergency Use Authorization (EUA). This EUA will remain in effect (meaning this test can be used) for the duration of the COVID-19 declaration under Section 564(b)(1) of the Act, 21 U.S.C. section 360bbb-3(b)(1), unless the authorization is terminated or revoked.  Performed at [facility], [place] [address], [place], [place] [address]          Radiology Studies: No results found.      Scheduled Meds:  carvedilol  6.25 mg Oral BID   citalopram  20 mg Oral Daily   enoxaparin (LOVENOX) injection  40 mg Subcutaneous Q24H   insulin aspart  0-20 Units Subcutaneous TID WC   insulin glargine-yfgn  70 Units Subcutaneous Daily   nystatin   Topical TID   sodium bicarbonate  650 mg Oral BID   vitamin B-12  1,000 mcg Oral Daily   Continuous Infusions:        Aline August, MD [facility] 09/11/2021, 11:43 AM

## 2021-09-11 NOTE — Plan of Care (Signed)
  Problem: Education: Goal: Ability to describe self-care measures that may prevent or decrease complications (Diabetes Survival Skills Education) will improve Outcome: Progressing Goal: Individualized Educational Video(s) Outcome: Progressing   Problem: Coping: Goal: Ability to adjust to condition or change in health will improve Outcome: Progressing   Problem: Fluid Volume: Goal: Ability to maintain a balanced intake and output will improve Outcome: Progressing   Problem: Health Behavior/Discharge Planning: Goal: Ability to identify and utilize available resources and services will improve Outcome: Progressing Goal: Ability to manage health-related needs will improve Outcome: Progressing   Problem: Metabolic: Goal: Ability to maintain appropriate glucose levels will improve Outcome: Progressing   Problem: Nutritional: Goal: Progress toward achieving an optimal weight will improve Outcome: Progressing   Problem: Skin Integrity: Goal: Risk for impaired skin integrity will decrease Outcome: Progressing   Problem: Education: Goal: Knowledge of General Education information will improve Description: Including pain rating scale, medication(s)/side effects and non-pharmacologic comfort measures Outcome: Progressing   Problem: Health Behavior/Discharge Planning: Goal: Ability to manage health-related needs will improve Outcome: Progressing   Problem: Clinical Measurements: Goal: Ability to maintain clinical measurements within normal limits will improve Outcome: Progressing   Problem: Clinical Measurements: Goal: Ability to maintain clinical measurements within normal limits will improve Outcome: Progressing Goal: Will remain free from infection Outcome: Progressing Goal: Diagnostic test results will improve Outcome: Progressing Goal: Respiratory complications will improve Outcome: Progressing Goal: Cardiovascular complication will be avoided Outcome: Progressing    Problem: Activity: Goal: Risk for activity intolerance will decrease Outcome: Progressing   Problem: Elimination: Goal: Will not experience complications related to bowel motility Outcome: Progressing Goal: Will not experience complications related to urinary retention Outcome: Progressing   Problem: Pain Managment: Goal: General experience of comfort will improve Outcome: Progressing   Problem: Safety: Goal: Ability to remain free from injury will improve Outcome: Progressing   Problem: Skin Integrity: Goal: Risk for impaired skin integrity will decrease Outcome: Progressing

## 2021-09-12 DIAGNOSIS — N184 Chronic kidney disease, stage 4 (severe): Secondary | ICD-10-CM | POA: Diagnosis not present

## 2021-09-12 DIAGNOSIS — I10 Essential (primary) hypertension: Secondary | ICD-10-CM | POA: Diagnosis not present

## 2021-09-12 DIAGNOSIS — E039 Hypothyroidism, unspecified: Secondary | ICD-10-CM

## 2021-09-12 DIAGNOSIS — E875 Hyperkalemia: Secondary | ICD-10-CM | POA: Diagnosis not present

## 2021-09-12 DIAGNOSIS — I5032 Chronic diastolic (congestive) heart failure: Secondary | ICD-10-CM | POA: Diagnosis not present

## 2021-09-12 LAB — BASIC METABOLIC PANEL
Anion gap: 8 (ref 5–15)
BUN: 42 mg/dL — ABNORMAL HIGH (ref 8–23)
CO2: 18 mmol/L — ABNORMAL LOW (ref 22–32)
Calcium: 8.2 mg/dL — ABNORMAL LOW (ref 8.9–10.3)
Chloride: 110 mmol/L (ref 98–111)
Creatinine, Ser: 2.14 mg/dL — ABNORMAL HIGH (ref 0.44–1.00)
GFR, Estimated: 24 mL/min — ABNORMAL LOW (ref >60)
Glucose, Bld: 131 mg/dL — ABNORMAL HIGH (ref 70–99)
Potassium: 5 mmol/L (ref 3.5–5.1)
Sodium: 136 mmol/L (ref 135–145)

## 2021-09-12 LAB — MAGNESIUM: Magnesium: 2 mg/dL (ref 1.7–2.4)

## 2021-09-12 LAB — GLUCOSE, CAPILLARY
Glucose-Capillary: 122 mg/dL — ABNORMAL HIGH (ref 70–99)
Glucose-Capillary: 130 mg/dL — ABNORMAL HIGH (ref 70–99)
Glucose-Capillary: 148 mg/dL — ABNORMAL HIGH (ref 70–99)

## 2021-09-12 MED ORDER — ENOXAPARIN SODIUM 30 MG/0.3ML IJ SOSY
30.0000 mg | PREFILLED_SYRINGE | INTRAMUSCULAR | Status: DC
  Administered 2021-09-12 – 2021-09-13 (×2): 30 mg via SUBCUTANEOUS
  Filled 2021-09-12 (×2): qty 0.3

## 2021-09-12 MED ORDER — FUROSEMIDE 40 MG PO TABS
40.0000 mg | ORAL_TABLET | Freq: Every day | ORAL | Status: DC
  Administered 2021-09-12 – 2021-09-14 (×3): 40 mg via ORAL
  Filled 2021-09-12 (×3): qty 1

## 2021-09-12 NOTE — Progress Notes (Signed)
Pt refusing CPAP QHS.  Machine remains at bedside, pt to notify RT if she changes her mind.

## 2021-09-12 NOTE — TOC Progression Note (Signed)
Transition of Care [facility]) - Progression Note    Patient Details  Name: Kaitlin Berry MRN: [id number] Date of Birth: Oct 07, 1945  Transition of Care [facility]) CM/SW [place], LCSW Phone Number: 09/12/2021, 9:58 AM  Clinical Narrative:    Pt's insurance Josem Kaufmann has been approved for SNF. Auth ID #[id number] and is valid from 7/15-7/18.    Expected Discharge Plan: [place] Barriers to Discharge: Continued Medical Work up  Expected Discharge Plan and Services Expected Discharge Plan: [place] In-house Referral: Clinical Social Work Discharge Planning Services: CM Consult Post Acute Care Choice: [place] Living arrangements for the past 2 months: Single Family Home                 DME Arranged: N/A DME Agency: NA                   Social Determinants of Health (SDOH) Interventions    Readmission Risk Interventions     No data to display

## 2021-09-12 NOTE — Progress Notes (Signed)
PROGRESS NOTE    Kaitlin Berry  JJK:[id number] DOB: Mar 18, 1945 DOA: 09/09/2021 PCP: Christain Sacramento, MD   Brief Narrative:  76 y.o. female with medical history significant of anxiety, osteoarthritis of the right knee, chronic back pain, depression, type 2 diabetes, morbid obesity, GERD, IBS, hyperlipidemia, hypersomnia, hypertension, obstructive sleep apnea on CPAP, psoriasis, urticaria who had a fall at home while rushing to the bathroom due to having diarrhea secondary to her IBS.  She fell and hit her knee and was in severe right knee pain.  On presentation, potassium was 6.5, bicarb of 17, creatinine of 1.77 (close to baseline) chest x-ray was negative for acute finding.  Right knee/right hip x-rays, CT head, CT right knee/right hip were negative for acute fractures.  She was treated with IV sodium bicarbonate/insulin/calcium gluconate in the ED and subsequently also received Lokelma.  PT recommended SNF placement  Assessment & Plan:   Hyperkalemia -Presented with 6.5 potassium, subsequent potassium was 6.8.  Received IV sodium bicarbonate/insulin/calcium gluconate in the ED and subsequently also received Lokelma. -Potassium 5 this morning. Received few doses of lokelma since admission. -ARB/spironolactone on hold -Repeat a.m. labs  CKD stage IV Chronic metabolic acidosis -Creatinine at baseline.  Bicarb of 18 today.  Continue sodium bicarbonate 650 mg 3 times a day.  Repeat a.m. labs.  Outpatient follow-up with nephrology/Dr. Gean Quint -Start lasix '40mg'$  daily  Chronic diastolic heart failure -Compensated.  Strict input and output.  Daily weights.  Continue Coreg.  Losartan and spironolactone on hold.  Outpatient follow-up with cardiology -We will start Lasix 40 mg daily today.  Hypertension -Blood pressure on the high side.  Monitor.  Continue Coreg and Lasix.  Morbid obesity -Outpatient follow-up  Diabetes mellitus type 2 -Carb modified diet.  Continue long-acting insulin and  CBGs with SSI OSA -Continue CPAP at bedtime  Fall at home Chronic knee pain -No fractures on imaging.  PT recommended SNF placement.  TOC following.   DVT prophylaxis: Lovenox Code Status: Full Family Communication: Husband at bedside Disposition Plan: Status is: inpatient because: Need for SNF placement   Consultants: None  Procedures: None  Antimicrobials: None   Subjective: Patient seen and examined at bedside. Knee pain is improving. Complains of some leg swelling.  Denies worsening chest pain or fever or vomiting  objective: Vitals:   09/11/21 1957 09/12/21 0331 09/12/21 0848 09/12/21 1311  BP: (!) 153/58 (!) 175/68  (!) 158/59  Pulse: 74 75 69 66  Resp: '18 18  18  '$ Temp: 98.3 F (36.8 C) (!) 97.5 F (36.4 C)  98.5 F (36.9 C)  TempSrc: Oral Oral  Oral  SpO2: 92% 99% 99% 96%  Weight:      Height:        Intake/Output Summary (Last 24 hours) at 09/12/2021 1341 Last data filed at 09/12/2021 0900 Gross per 24 hour  Intake 120 ml  Output 900 ml  Net -780 ml    Filed Weights   09/09/21 0221  Weight: 129.7 kg    Examination:  General: No acute distress.  Still on 2 L oxygen via nasal cannula. chronically ill and deconditioned. ENT/neck: No neck masses or JVD elevation noted  respiratory: Bilateral decreased breath sounds at bases with some crackles,  CVS: Currently rate controlled; S1 and S2 are heard  abdominal: Soft, morbidly obese, nontender, still slightly distended; no organomegaly, bowel sounds are heard normally  extremities: No clubbing; mild lower extremity edema present CNS: Alert and oriented.  No focal neurologic deficit.  Moving extremities  lymph: No palpable cervical lymphadenopathy Skin: No obvious rashes/petechiae  psych: No signs of agitation. Flat affect   Data Reviewed: I have personally reviewed following labs and imaging studies  CBC: Recent Labs  Lab 09/09/21 1252  WBC 10.4  NEUTROABS 6.9  HGB 12.1  HCT 39.0  MCV 98.2   PLT 147    Basic Metabolic Panel: Recent Labs  Lab 09/09/21 1252 09/09/21 2024 09/10/21 0545 09/11/21 0531 09/12/21 0531  NA 137 137 137 137 136  K 6.5* 6.8* 5.6* 5.4* 5.0  CL 114* 115* 113* 112* 110  CO2 17* 16* 19* 19* 18*  GLUCOSE 155* 185* 126* 95 131*  BUN 39* 37* 37* 38* 42*  CREATININE 1.77* 1.89* 1.77* 2.00* 2.14*  CALCIUM 8.8* 8.5* 8.6* 8.6* 8.2*  MG  --   --   --  2.2 2.0    GFR: Estimated Creatinine Clearance: 28.9 mL/min (A) (by C-G formula based on SCr of 2.14 mg/dL (H)). Liver Function Tests: Recent Labs  Lab 09/10/21 0545  AST 29  ALT 27  ALKPHOS 114  BILITOT 0.7  PROT 7.8  ALBUMIN 3.4*    No results for input(s): "LIPASE", "AMYLASE" in the last 168 hours. No results for input(s): "AMMONIA" in the last 168 hours. Coagulation Profile: No results for input(s): "INR", "PROTIME" in the last 168 hours. Cardiac Enzymes: No results for input(s): "CKTOTAL", "CKMB", "CKMBINDEX", "TROPONINI" in the last 168 hours. BNP (last 3 results) No results for input(s): "PROBNP" in the last 8760 hours. HbA1C: No results for input(s): "HGBA1C" in the last 72 hours. CBG: Recent Labs  Lab 09/10/21 2127 09/11/21 0737 09/11/21 1125 09/11/21 2114 09/12/21 1117  GLUCAP 129* 99 164* 158* 122*    Lipid Profile: No results for input(s): "CHOL", "HDL", "LDLCALC", "TRIG", "CHOLHDL", "LDLDIRECT" in the last 72 hours. Thyroid Function Tests: No results for input(s): "TSH", "T4TOTAL", "FREET4", "T3FREE", "THYROIDAB" in the last 72 hours. Anemia Panel: No results for input(s): "VITAMINB12", "FOLATE", "FERRITIN", "TIBC", "IRON", "RETICCTPCT" in the last 72 hours. Sepsis Labs: No results for input(s): "PROCALCITON", "LATICACIDVEN" in the last 168 hours.  Recent Results (from the past 240 hour(s))  Resp Panel by RT-PCR (Flu A&B, Covid)     Status: None   Collection Time: 09/09/21 12:52 PM   Specimen: Nasal Swab  Result Value Ref Range Status   SARS Coronavirus 2 by RT  PCR NEGATIVE NEGATIVE Final    Comment: (NOTE) SARS-CoV-2 target nucleic acids are NOT DETECTED.  The SARS-CoV-2 RNA is generally detectable in upper respiratory specimens during the acute phase of infection. The lowest concentration of SARS-CoV-2 viral copies this assay can detect is 138 copies/mL. A negative result does not preclude SARS-Cov-2 infection and should not be used as the sole basis for treatment or other patient management decisions. A negative result may occur with  improper specimen collection/handling, submission of specimen other than nasopharyngeal swab, presence of viral mutation(s) within the areas targeted by this assay, and inadequate number of viral copies(<138 copies/mL). A negative result must be combined with clinical observations, patient history, and epidemiological information. The expected result is Negative.  Fact Sheet for Patients:  EntrepreneurPulse.com.au  Fact Sheet for Healthcare Providers:  IncredibleEmployment.be  This test is no t yet approved or cleared by the [place] FDA and  has been authorized for detection and/or diagnosis of SARS-CoV-2 by FDA under an Emergency Use Authorization (EUA). This EUA will remain  in effect (meaning this test can be used) for the duration  of the COVID-19 declaration under Section 564(b)(1) of the Act, 21 U.S.C.section 360bbb-3(b)(1), unless the authorization is terminated  or revoked sooner.       Influenza A by PCR NEGATIVE NEGATIVE Final   Influenza B by PCR NEGATIVE NEGATIVE Final    Comment: (NOTE) The Xpert Xpress SARS-CoV-2/FLU/RSV plus assay is intended as an aid in the diagnosis of influenza from Nasopharyngeal swab specimens and should not be used as a sole basis for treatment. Nasal washings and aspirates are unacceptable for Xpert Xpress SARS-CoV-2/FLU/RSV testing.  Fact Sheet for Patients: EntrepreneurPulse.com.au  Fact Sheet for  Healthcare Providers: IncredibleEmployment.be  This test is not yet approved or cleared by the [place] FDA and has been authorized for detection and/or diagnosis of SARS-CoV-2 by FDA under an Emergency Use Authorization (EUA). This EUA will remain in effect (meaning this test can be used) for the duration of the COVID-19 declaration under Section 564(b)(1) of the Act, 21 U.S.C. section 360bbb-3(b)(1), unless the authorization is terminated or revoked.  Performed at [facility], [place] [address] [place], [place] [address]          Radiology Studies: No results found.      Scheduled Meds:  carvedilol  6.25 mg Oral BID   citalopram  20 mg Oral Daily   enoxaparin (LOVENOX) injection  40 mg Subcutaneous Q24H   furosemide  40 mg Oral Daily   insulin aspart  0-20 Units Subcutaneous TID WC   insulin glargine-yfgn  35 Units Subcutaneous Daily   nystatin   Topical TID   sodium bicarbonate  650 mg Oral TID   vitamin B-12  1,000 mcg Oral Daily   Continuous Infusions:        Aline August, MD [facility] 09/12/2021, 1:41 PM

## 2021-09-13 DIAGNOSIS — E785 Hyperlipidemia, unspecified: Secondary | ICD-10-CM

## 2021-09-13 DIAGNOSIS — N184 Chronic kidney disease, stage 4 (severe): Secondary | ICD-10-CM | POA: Diagnosis not present

## 2021-09-13 DIAGNOSIS — E1169 Type 2 diabetes mellitus with other specified complication: Secondary | ICD-10-CM

## 2021-09-13 DIAGNOSIS — E875 Hyperkalemia: Secondary | ICD-10-CM | POA: Diagnosis not present

## 2021-09-13 DIAGNOSIS — I10 Essential (primary) hypertension: Secondary | ICD-10-CM | POA: Diagnosis not present

## 2021-09-13 DIAGNOSIS — I5032 Chronic diastolic (congestive) heart failure: Secondary | ICD-10-CM | POA: Diagnosis not present

## 2021-09-13 LAB — URINALYSIS, ROUTINE W REFLEX MICROSCOPIC
Bilirubin Urine: NEGATIVE
Glucose, UA: NEGATIVE mg/dL
Ketones, ur: NEGATIVE mg/dL
Nitrite: POSITIVE — AB
Protein, ur: NEGATIVE mg/dL
Specific Gravity, Urine: 1.009 (ref 1.005–1.030)
pH: 5 (ref 5.0–8.0)

## 2021-09-13 LAB — BASIC METABOLIC PANEL
Anion gap: 7 (ref 5–15)
BUN: 47 mg/dL — ABNORMAL HIGH (ref 8–23)
CO2: 20 mmol/L — ABNORMAL LOW (ref 22–32)
Calcium: 8.5 mg/dL — ABNORMAL LOW (ref 8.9–10.3)
Chloride: 108 mmol/L (ref 98–111)
Creatinine, Ser: 2 mg/dL — ABNORMAL HIGH (ref 0.44–1.00)
GFR, Estimated: 26 mL/min — ABNORMAL LOW (ref >60)
Glucose, Bld: 132 mg/dL — ABNORMAL HIGH (ref 70–99)
Potassium: 4.9 mmol/L (ref 3.5–5.1)
Sodium: 135 mmol/L (ref 135–145)

## 2021-09-13 LAB — GLUCOSE, CAPILLARY
Glucose-Capillary: 116 mg/dL — ABNORMAL HIGH (ref 70–99)
Glucose-Capillary: 152 mg/dL — ABNORMAL HIGH (ref 70–99)
Glucose-Capillary: 126 mg/dL — ABNORMAL HIGH (ref 70–99)
Glucose-Capillary: 146 mg/dL — ABNORMAL HIGH (ref 70–99)
Glucose-Capillary: 131 mg/dL — ABNORMAL HIGH (ref 70–99)

## 2021-09-13 LAB — MAGNESIUM: Magnesium: 1.9 mg/dL (ref 1.7–2.4)

## 2021-09-13 MED ORDER — SENNOSIDES-DOCUSATE SODIUM 8.6-50 MG PO TABS
1.0000 | ORAL_TABLET | Freq: Two times a day (BID) | ORAL | Status: DC
  Administered 2021-09-13 – 2021-09-14 (×2): 1 via ORAL
  Filled 2021-09-13 (×2): qty 1

## 2021-09-13 MED ORDER — POLYETHYLENE GLYCOL 3350 17 G PO PACK: 17.0000 g | PACK | Freq: Every day | ORAL | Status: DC | PRN

## 2021-09-13 MED ORDER — BISACODYL 10 MG RE SUPP: 10.0000 mg | Freq: Every day | RECTAL | Status: DC | PRN

## 2021-09-13 NOTE — Progress Notes (Signed)
PROGRESS NOTE    Kaitlin Berry  QMG:[id number] DOB: Jun 30, 1945 DOA: 09/09/2021 PCP: Christain Sacramento, MD   Brief Narrative:  76 y.o. female with medical history significant of anxiety, osteoarthritis of the right knee, chronic back pain, depression, type 2 diabetes, morbid obesity, GERD, IBS, hyperlipidemia, hypersomnia, hypertension, obstructive sleep apnea on CPAP, psoriasis, urticaria who had a fall at home while rushing to the bathroom due to having diarrhea secondary to her IBS.  She fell and hit her knee and was in severe right knee pain.  On presentation, potassium was 6.5, bicarb of 17, creatinine of 1.77 (close to baseline) chest x-ray was negative for acute finding.  Right knee/right hip x-rays, CT head, CT right knee/right hip were negative for acute fractures.  She was treated with IV sodium bicarbonate/insulin/calcium gluconate in the ED and subsequently also received Lokelma.  PT recommended SNF placement.  Currently medically stable for discharge to SNF.  Assessment & Plan:   Hyperkalemia -Presented with 6.5 potassium, subsequent potassium was 6.8.  Received IV sodium bicarbonate/insulin/calcium gluconate in the ED and subsequently also received Lokelma. -Potassium 4.9 this morning. Received few doses of lokelma since admission. -ARB/spironolactone on hold -Repeat a.m. labs  CKD stage IV Chronic metabolic acidosis -Creatinine at baseline.  Bicarb of 20 today.  Continue sodium bicarbonate 650 mg 3 times a day.  Repeat a.m. labs.  Outpatient follow-up with nephrology/Dr. Gean Quint -Lasix 40 mg daily was started on 09/12/2021 which will be continued.  Chronic diastolic heart failure -Compensated.  Strict input and output.  Daily weights.  Continue Coreg.  Losartan and spironolactone on hold.  Outpatient follow-up with cardiology -Continue Lasix.  Concern for UTI -Patient feels that she might have a UTI.  Check UA.  Hypertension -Blood pressure on the high side.  Monitor.   Continue Coreg and Lasix.  Morbid obesity -Outpatient follow-up  Diabetes mellitus type 2 -Carb modified diet.  Continue long-acting insulin and CBGs with SSI OSA -Continue CPAP at bedtime  Fall at home Chronic knee pain -No fractures on imaging.  PT recommended SNF placement.  TOC following.   DVT prophylaxis: Lovenox Code Status: Full Family Communication: Husband at bedside Disposition Plan: Status is: inpatient because: Need for SNF placement   Consultants: None  Procedures: None  Antimicrobials: None   Subjective: Patient seen and examined at bedside.  No fever, vomiting, worsening abdominal pain reported.  Denies worsening shortness of breath.  Feels like she might have a urinary tract infection Objective: Vitals:   09/12/21 0848 09/12/21 1311 09/12/21 2150 09/13/21 0511  BP:  (!) 158/59 (!) 160/58 (!) 169/66  Pulse: 69 66 74 67  Resp:  '18 19 20  '$ Temp:  98.5 F (36.9 C) 98.2 F (36.8 C) 98.5 F (36.9 C)  TempSrc:  Oral Oral Oral  SpO2: 99% 96% 93% 92%  Weight:      Height:        Intake/Output Summary (Last 24 hours) at 09/13/2021 0819 Last data filed at 09/13/2021 0508 Gross per 24 hour  Intake 360 ml  Output 1300 ml  Net -940 ml    Filed Weights   09/09/21 0221  Weight: 129.7 kg    Examination:  General: On room air currently.  No distress currently.  Chronically ill and deconditioned. ENT/neck: No palpable thyromegaly or JVD elevation  respiratory: Decreased breath sounds at bases bilaterally with some basilar crackles  CVS: S1-S2 heard; rate controlled mostly  abdominal: Soft, morbidly obese, nontender, has mild distention; no organomegaly,  normal bowel sounds  extremities: Bilateral lower extremity edema present; no cyanosis  CNS: Awake and alert.  Slow to respond.  No focal neurologic deficit.  Able to move extremities  lymph: No palpable lymphadenopathy noted  skin: No obvious ecchymosis/lesions psych: Affect is flat.  Currently not  agitated   Data Reviewed: I have personally reviewed following labs and imaging studies  CBC: Recent Labs  Lab 09/09/21 1252  WBC 10.4  NEUTROABS 6.9  HGB 12.1  HCT 39.0  MCV 98.2  PLT 409    Basic Metabolic Panel: Recent Labs  Lab 09/09/21 2024 09/10/21 0545 09/11/21 0531 09/12/21 0531 09/13/21 0516  NA 137 137 137 136 135  K 6.8* 5.6* 5.4* 5.0 4.9  CL 115* 113* 112* 110 108  CO2 16* 19* 19* 18* 20*  GLUCOSE 185* 126* 95 131* 132*  BUN 37* 37* 38* 42* 47*  CREATININE 1.89* 1.77* 2.00* 2.14* 2.00*  CALCIUM 8.5* 8.6* 8.6* 8.2* 8.5*  MG  --   --  2.2 2.0 1.9    GFR: Estimated Creatinine Clearance: 30.9 mL/min (A) (by C-G formula based on SCr of 2 mg/dL (H)). Liver Function Tests: Recent Labs  Lab 09/10/21 0545  AST 29  ALT 27  ALKPHOS 114  BILITOT 0.7  PROT 7.8  ALBUMIN 3.4*    No results for input(s): "LIPASE", "AMYLASE" in the last 168 hours. No results for input(s): "AMMONIA" in the last 168 hours. Coagulation Profile: No results for input(s): "INR", "PROTIME" in the last 168 hours. Cardiac Enzymes: No results for input(s): "CKTOTAL", "CKMB", "CKMBINDEX", "TROPONINI" in the last 168 hours. BNP (last 3 results) No results for input(s): "PROBNP" in the last 8760 hours. HbA1C: No results for input(s): "HGBA1C" in the last 72 hours. CBG: Recent Labs  Lab 09/11/21 2114 09/12/21 1117 09/12/21 1638 09/12/21 2149 09/13/21 0727  GLUCAP 158* 122* 130* 148* 116*    Lipid Profile: No results for input(s): "CHOL", "HDL", "LDLCALC", "TRIG", "CHOLHDL", "LDLDIRECT" in the last 72 hours. Thyroid Function Tests: No results for input(s): "TSH", "T4TOTAL", "FREET4", "T3FREE", "THYROIDAB" in the last 72 hours. Anemia Panel: No results for input(s): "VITAMINB12", "FOLATE", "FERRITIN", "TIBC", "IRON", "RETICCTPCT" in the last 72 hours. Sepsis Labs: No results for input(s): "PROCALCITON", "LATICACIDVEN" in the last 168 hours.  Recent Results (from the past 240  hour(s))  Resp Panel by RT-PCR (Flu A&B, Covid)     Status: None   Collection Time: 09/09/21 12:52 PM   Specimen: Nasal Swab  Result Value Ref Range Status   SARS Coronavirus 2 by RT PCR NEGATIVE NEGATIVE Final    Comment: (NOTE) SARS-CoV-2 target nucleic acids are NOT DETECTED.  The SARS-CoV-2 RNA is generally detectable in upper respiratory specimens during the acute phase of infection. The lowest concentration of SARS-CoV-2 viral copies this assay can detect is 138 copies/mL. A negative result does not preclude SARS-Cov-2 infection and should not be used as the sole basis for treatment or other patient management decisions. A negative result may occur with  improper specimen collection/handling, submission of specimen other than nasopharyngeal swab, presence of viral mutation(s) within the areas targeted by this assay, and inadequate number of viral copies(<138 copies/mL). A negative result must be combined with clinical observations, patient history, and epidemiological information. The expected result is Negative.  Fact Sheet for Patients:  EntrepreneurPulse.com.au  Fact Sheet for Healthcare Providers:  IncredibleEmployment.be  This test is no t yet approved or cleared by the [place] FDA and  has been authorized for detection  and/or diagnosis of SARS-CoV-2 by FDA under an Emergency Use Authorization (EUA). This EUA will remain  in effect (meaning this test can be used) for the duration of the COVID-19 declaration under Section 564(b)(1) of the Act, 21 U.S.C.section 360bbb-3(b)(1), unless the authorization is terminated  or revoked sooner.       Influenza A by PCR NEGATIVE NEGATIVE Final   Influenza B by PCR NEGATIVE NEGATIVE Final    Comment: (NOTE) The Xpert Xpress SARS-CoV-2/FLU/RSV plus assay is intended as an aid in the diagnosis of influenza from Nasopharyngeal swab specimens and should not be used as a sole basis for  treatment. Nasal washings and aspirates are unacceptable for Xpert Xpress SARS-CoV-2/FLU/RSV testing.  Fact Sheet for Patients: EntrepreneurPulse.com.au  Fact Sheet for Healthcare Providers: IncredibleEmployment.be  This test is not yet approved or cleared by the [place] FDA and has been authorized for detection and/or diagnosis of SARS-CoV-2 by FDA under an Emergency Use Authorization (EUA). This EUA will remain in effect (meaning this test can be used) for the duration of the COVID-19 declaration under Section 564(b)(1) of the Act, 21 U.S.C. section 360bbb-3(b)(1), unless the authorization is terminated or revoked.  Performed at [facility], [place] [address] [place], [place] [address]          Radiology Studies: No results found.      Scheduled Meds:  carvedilol  6.25 mg Oral BID   citalopram  20 mg Oral Daily   enoxaparin (LOVENOX) injection  30 mg Subcutaneous Q24H   furosemide  40 mg Oral Daily   insulin aspart  0-20 Units Subcutaneous TID WC   insulin glargine-yfgn  35 Units Subcutaneous Daily   nystatin   Topical TID   sodium bicarbonate  650 mg Oral TID   vitamin B-12  1,000 mcg Oral Daily   Continuous Infusions:        Aline August, MD [facility] 09/13/2021, 8:19 AM

## 2021-09-13 NOTE — TOC Progression Note (Addendum)
Transition of Care [facility]) - Progression Note    Patient Details  Name: Kaitlin Berry MRN: [id number] Date of Birth: 03/30/1945  [facility] CM/SW Contact  Ross Ludwig, [place] Phone Number: 09/13/2021, 11:37 AM  Clinical Narrative:     CSW attempted to contact Kitty in admissions at [facility], [employer] had to leave a message to see if they can accept over weekend.  CSW awaiting for call back.  CSW called SNF to see if they can accept patient, and they said no because the admissions worker is not in today and CSW will have call tomorrow.  CSW updated attending physician.   Expected Discharge Plan: [place] Barriers to Discharge: Continued Medical Work up  Expected Discharge Plan and Services Expected Discharge Plan: [place] In-house Referral: Clinical Social Work Discharge Planning Services: CM Consult Post Acute Care Choice: [place] Living arrangements for the past 2 months: Single Family Home                 DME Arranged: N/A DME Agency: NA                   Social Determinants of Health (SDOH) Interventions    Readmission Risk Interventions     No data to display

## 2021-09-13 NOTE — Progress Notes (Signed)
Patient does not want to wear cpap tonight ?

## 2021-09-13 NOTE — Plan of Care (Signed)
  Problem: Education: Goal: Ability to describe self-care measures that may prevent or decrease complications (Diabetes Survival Skills Education) will improve Outcome: Progressing Goal: Individualized Educational Video(s) Outcome: Progressing   Problem: Coping: Goal: Ability to adjust to condition or change in health will improve Outcome: Progressing   Problem: Fluid Volume: Goal: Ability to maintain a balanced intake and output will improve Outcome: Progressing   Problem: Health Behavior/Discharge Planning: Goal: Ability to identify and utilize available resources and services will improve Outcome: Progressing Goal: Ability to manage health-related needs will improve Outcome: Progressing   Problem: Metabolic: Goal: Ability to maintain appropriate glucose levels will improve Outcome: Progressing   Problem: Nutritional: Goal: Progress toward achieving an optimal weight will improve Outcome: Progressing   Problem: Skin Integrity: Goal: Risk for impaired skin integrity will decrease Outcome: Progressing   Problem: Education: Goal: Knowledge of General Education information will improve Description: Including pain rating scale, medication(s)/side effects and non-pharmacologic comfort measures Outcome: Progressing   Problem: Health Behavior/Discharge Planning: Goal: Ability to manage health-related needs will improve Outcome: Progressing   Problem: Clinical Measurements: Goal: Ability to maintain clinical measurements within normal limits will improve Outcome: Progressing Goal: Will remain free from infection Outcome: Progressing Goal: Diagnostic test results will improve Outcome: Progressing Goal: Respiratory complications will improve Outcome: Progressing Goal: Cardiovascular complication will be avoided Outcome: Progressing   Problem: Activity: Goal: Risk for activity intolerance will decrease Outcome: Progressing   Problem: Elimination: Goal: Will not  experience complications related to bowel motility Outcome: Progressing Goal: Will not experience complications related to urinary retention Outcome: Progressing   Problem: Pain Managment: Goal: General experience of comfort will improve Outcome: Progressing   Problem: Safety: Goal: Ability to remain free from injury will improve Outcome: Progressing   Problem: Skin Integrity: Goal: Risk for impaired skin integrity will decrease Outcome: Progressing

## 2021-09-14 ENCOUNTER — Ambulatory Visit: Payer: Medicare HMO

## 2021-09-14 DIAGNOSIS — E875 Hyperkalemia: Secondary | ICD-10-CM | POA: Diagnosis not present

## 2021-09-14 DIAGNOSIS — I10 Essential (primary) hypertension: Secondary | ICD-10-CM | POA: Diagnosis not present

## 2021-09-14 DIAGNOSIS — I5032 Chronic diastolic (congestive) heart failure: Secondary | ICD-10-CM | POA: Diagnosis not present

## 2021-09-14 DIAGNOSIS — N184 Chronic kidney disease, stage 4 (severe): Secondary | ICD-10-CM | POA: Diagnosis not present

## 2021-09-14 LAB — BASIC METABOLIC PANEL
Anion gap: 9 (ref 5–15)
BUN: 50 mg/dL — ABNORMAL HIGH (ref 8–23)
CO2: 20 mmol/L — ABNORMAL LOW (ref 22–32)
Calcium: 8.8 mg/dL — ABNORMAL LOW (ref 8.9–10.3)
Chloride: 110 mmol/L (ref 98–111)
Creatinine, Ser: 1.95 mg/dL — ABNORMAL HIGH (ref 0.44–1.00)
GFR, Estimated: 26 mL/min — ABNORMAL LOW (ref >60)
Glucose, Bld: 122 mg/dL — ABNORMAL HIGH (ref 70–99)
Potassium: 4.7 mmol/L (ref 3.5–5.1)
Sodium: 139 mmol/L (ref 135–145)

## 2021-09-14 LAB — CBC WITH DIFFERENTIAL/PLATELET
Abs Immature Granulocytes: 0.03 10*3/uL (ref 0.00–0.07)
Basophils Absolute: 0 10*3/uL (ref 0.0–0.1)
Basophils Relative: 1 %
Eosinophils Absolute: 0.5 10*3/uL (ref 0.0–0.5)
Eosinophils Relative: 7 %
HCT: 36.8 % (ref 36.0–46.0)
Hemoglobin: 11.6 g/dL — ABNORMAL LOW (ref 12.0–15.0)
Immature Granulocytes: 0 %
Lymphocytes Relative: 25 %
Lymphs Abs: 2.1 10*3/uL (ref 0.7–4.0)
MCH: 30.2 pg (ref 26.0–34.0)
MCHC: 31.5 g/dL (ref 30.0–36.0)
MCV: 95.8 fL (ref 80.0–100.0)
Monocytes Absolute: 0.9 10*3/uL (ref 0.1–1.0)
Monocytes Relative: 10 %
Neutro Abs: 4.8 10*3/uL (ref 1.7–7.7)
Neutrophils Relative %: 57 %
Platelets: 227 10*3/uL (ref 150–400)
RBC: 3.84 MIL/uL — ABNORMAL LOW (ref 3.87–5.11)
RDW: 13.9 % (ref 11.5–15.5)
WBC: 8.3 10*3/uL (ref 4.0–10.5)
nRBC: 0 % (ref 0.0–0.2)

## 2021-09-14 LAB — MAGNESIUM: Magnesium: 2.1 mg/dL (ref 1.7–2.4)

## 2021-09-14 LAB — GLUCOSE, CAPILLARY
Glucose-Capillary: 121 mg/dL — ABNORMAL HIGH (ref 70–99)
Glucose-Capillary: 146 mg/dL — ABNORMAL HIGH (ref 70–99)
Glucose-Capillary: 122 mg/dL — ABNORMAL HIGH (ref 70–99)
Glucose-Capillary: 113 mg/dL — ABNORMAL HIGH (ref 70–99)

## 2021-09-14 MED ORDER — SENNOSIDES-DOCUSATE SODIUM 8.6-50 MG PO TABS: 1.0000 | ORAL_TABLET | Freq: Two times a day (BID) | ORAL | 0 refills | Status: DC

## 2021-09-14 MED ORDER — SODIUM CHLORIDE 0.9 % IV SOLN
1.0000 g | INTRAVENOUS | Status: DC
  Administered 2021-09-14: 1 g via INTRAVENOUS
  Filled 2021-09-14: qty 10

## 2021-09-14 MED ORDER — HYDRALAZINE HCL 20 MG/ML IJ SOLN
5.0000 mg | INTRAMUSCULAR | Status: DC | PRN
  Administered 2021-09-14 (×2): 5 mg via INTRAVENOUS
  Filled 2021-09-14 (×2): qty 1

## 2021-09-14 MED ORDER — TRIAMCINOLONE ACETONIDE 0.1 % EX CREA: 1.0000 | TOPICAL_CREAM | Freq: Every day | CUTANEOUS | Status: DC | PRN

## 2021-09-14 MED ORDER — TRESIBA FLEXTOUCH 100 UNIT/ML ~~LOC~~ SOPN: 35.0000 [IU] | PEN_INJECTOR | Freq: Every day | SUBCUTANEOUS | Status: AC

## 2021-09-14 MED ORDER — OXYCODONE HCL 10 MG PO TABS: 10.0000 mg | ORAL_TABLET | Freq: Four times a day (QID) | ORAL | 0 refills | Status: DC | PRN

## 2021-09-14 MED ORDER — CEPHALEXIN 500 MG PO CAPS: 500.0000 mg | ORAL_CAPSULE | Freq: Two times a day (BID) | ORAL | 0 refills | Status: AC

## 2021-09-14 MED ORDER — FUROSEMIDE 40 MG PO TABS
40.0000 mg | ORAL_TABLET | Freq: Every day | ORAL | 0 refills | Status: AC
Start: 2021-09-14 — End: ?

## 2021-09-14 MED ORDER — HYDRALAZINE HCL 20 MG/ML IJ SOLN
5.0000 mg | Freq: Once | INTRAMUSCULAR | Status: AC
Start: 2021-09-14 — End: 2021-09-14
  Administered 2021-09-14: 5 mg via INTRAVENOUS
  Filled 2021-09-14: qty 1

## 2021-09-14 MED ORDER — POLYETHYLENE GLYCOL 3350 17 G PO PACK: 17.0000 g | PACK | Freq: Every day | ORAL | 0 refills | Status: DC | PRN

## 2021-09-14 MED ORDER — SODIUM BICARBONATE 650 MG PO TABS: 650.0000 mg | ORAL_TABLET | Freq: Three times a day (TID) | ORAL | 0 refills | Status: AC

## 2021-09-14 NOTE — Care Management Important Message (Signed)
Important Message  Patient Details IM Letter given to the Patient. Name: Kaitlin Berry MRN: [id number] Date of Birth: 09/21/1945   Medicare Important Message Given:  Yes     Kerin Salen 09/14/2021, 11:54 AM

## 2021-09-14 NOTE — Progress Notes (Signed)
Report called to [facility] at 1222.

## 2021-09-14 NOTE — Discharge Summary (Signed)
Physician Discharge Summary  Kaitlin Berry TKW:[id number] DOB: [birth year] DOA: 09/09/2021  PCP: Christain Sacramento, MD  Admit date: 09/09/2021 Discharge date: 09/14/2021  Admitted From: Home Disposition: SNF  Recommendations for Outpatient Follow-up:  Follow up with SNF provider at earliest convenience with repeat BMP in the next few days  Outpatient follow-up with nephrology  Outpatient follow-up with cardiology  follow up in ED if symptoms worsen or new appear   Home Health: No Equipment/Devices: None  Discharge Condition: Stable CODE STATUS: Full Diet recommendation: Heart healthy/carb modified/fluid restriction of up to 1500 cc a day  Brief/Interim Summary: 76 y.o. female with medical history significant of anxiety, osteoarthritis of the right knee, chronic back pain, depression, type 2 diabetes, morbid obesity, GERD, IBS, hyperlipidemia, hypersomnia, hypertension, obstructive sleep apnea on CPAP, psoriasis, urticaria who had a fall at home while rushing to the bathroom due to having diarrhea secondary to her IBS.  She fell and hit her knee and was in severe right knee pain.  On presentation, potassium was 6.5, bicarb of 17, creatinine of 1.77 (close to baseline) chest x-ray was negative for acute finding.  Right knee/right hip x-rays, CT head, CT right knee/right hip were negative for acute fractures.  She was treated with IV sodium bicarbonate/insulin/calcium gluconate in the ED and subsequently also received Lokelma.  PT recommended SNF placement.  Currently medically stable for discharge to SNF.  She will be discharged to SNF once bed is available.    Discharge Diagnoses:   Hyperkalemia -Presented with 6.5 potassium, subsequent potassium was 6.8.  Received IV sodium bicarbonate/insulin/calcium gluconate in the ED and subsequently also received Lokelma. -Potassium 4.7 this morning. Received few doses of lokelma since admission. -ARB/spironolactone have been discontinued -Outpatient  follow-up of BMP.   CKD stage IV Chronic metabolic acidosis -Creatinine at baseline.  Bicarb of 20 today.  Continue sodium bicarbonate 650 mg 3 times a day.  Outpatient follow-up with nephrology/Dr. Gean Quint -Lasix 40 mg daily was started on 09/12/2021 which will be continued.   Chronic diastolic heart failure -Compensated.  Strict input and output.  Daily weights.  Continue Coreg.  Losartan and spironolactone on hold.  Outpatient follow-up with cardiology -Continue Lasix.   Concern for UTI -UA on 09/13/2021 was concerning for UTI.  Rocephin has been started in the hospital.  She will be discharged on Keflex to finish therapy.    Hypertension -Continue Coreg and Lasix and Lasix.   Morbid obesity -Outpatient follow-up   Diabetes mellitus type 2 -Carb modified diet.  Continue lower dose of long-acting insulin.  Outpatient follow-up  -Continue CPAP at bedtime   Fall at home Chronic knee pain -No fractures on imaging.  PT recommended SNF placement.  Discharge to SNF once bed is available  Discharge Instructions  Discharge Instructions     Ambulatory referral to Cardiology   Complete by: As directed    Diet - low sodium heart healthy   Complete by: As directed    Diet Carb Modified   Complete by: As directed       Allergies as of 09/14/2021       Reactions   Atorvastatin Other (See Comments)   Ciprofloxacin Other (See Comments), Hives   Unknown   Ciprofloxacin Hcl Hives   Lisinopril Rash        Medication List     STOP taking these medications    Fiasp FlexTouch 100 UNIT/ML FlexTouch Pen Generic drug: insulin aspart   losartan 25 MG tablet Commonly known as:  COZAAR   spironolactone 25 MG tablet Commonly known as: ALDACTONE       TAKE these medications    blood glucose meter kit and supplies Kit Dispense based on patient and insurance preference. Use up to four times daily as directed. (FOR ICD-9 250.00, 250.01).   carvedilol 3.125 MG  tablet Commonly known as: COREG Take 2 tablets (6.25 mg total) by mouth 2 (two) times daily.   cephALEXin 500 MG capsule Commonly known as: KEFLEX Take 1 capsule (500 mg total) by mouth 2 (two) times daily for 5 days.   citalopram 40 MG tablet Commonly known as: CELEXA Take 40 mg by mouth daily.   furosemide 40 MG tablet Commonly known as: LASIX Take 1 tablet (40 mg total) by mouth daily.   Oxycodone HCl 10 MG Tabs Take 1 tablet (10 mg total) by mouth every 6 (six) hours as needed for severe pain.   polyethylene glycol 17 g packet Commonly known as: MIRALAX / GLYCOLAX Take 17 g by mouth daily as needed for moderate constipation.   senna-docusate 8.6-50 MG tablet Commonly known as: Senokot-S Take 1 tablet by mouth 2 (two) times daily.   Skyrizi Pen 150 MG/ML Soaj Generic drug: Risankizumab-rzaa INJECT 150MG (1 PEN) SUBCUTANEOUSLY EVERY 12 WEEKS AS DIRECTED. What changed: See the new instructions.   sodium bicarbonate 650 MG tablet Take 1 tablet (650 mg total) by mouth 3 (three) times daily.   Tyler Aas FlexTouch 100 UNIT/ML FlexTouch Pen Generic drug: insulin degludec Inject 35 Units into the skin daily. What changed: how much to take   triamcinolone cream 0.1 % Commonly known as: KENALOG Apply 1 Application topically daily as needed (flare up).   Trulicity 8.67 EH/2.0NO Sopn Generic drug: Dulaglutide Inject 0.75 mg into the skin once a week.   vitamin B-12 1000 MCG tablet Commonly known as: CYANOCOBALAMIN Take 1,000 mcg by mouth daily.        Contact information for follow-up providers     Christain Sacramento, MD In 2 days.   Specialty: Family Medicine Contact information: [address] [place] [address] [phone]              Contact information for after-discharge care     Destination     [facility] Preferred SNF .   Service: Skilled Nursing Contact information: [address]  [address] [phone]                    Allergies  Allergen Reactions   Atorvastatin Other (See Comments)   Ciprofloxacin Other (See Comments) and Hives    Unknown   Ciprofloxacin Hcl Hives   Lisinopril Rash    Consultations: None   Procedures/Studies: CT Hip Right Wo Contrast  Result Date: 09/09/2021 CLINICAL DATA:  Hip trauma, pain EXAM: CT OF THE RIGHT HIP WITHOUT CONTRAST TECHNIQUE: Multidetector CT imaging of the right hip was performed according to the standard protocol. Multiplanar CT image reconstructions were also generated. RADIATION DOSE REDUCTION: This exam was performed according to the departmental dose-optimization program which includes automated exposure control, adjustment of the mA and/or kV according to patient size and/or use of iterative reconstruction technique. COMPARISON:  None Available. FINDINGS: Bones/Joint/Cartilage Generalized osteopenia. No acute fracture or dislocation. Mild osteoarthritis of the right hip. Normal alignment. No joint effusion. Ligaments Ligaments are suboptimally evaluated by CT. Muscles and Tendons Muscles are normal. No muscle atrophy. No intramuscular fluid collection or hematoma. Soft tissue No fluid  collection or hematoma. No soft tissue mass. Peripheral vascular atherosclerotic disease. IMPRESSION: 1. No acute fracture or dislocation of the right hip. 2. Mild osteoarthritis of the right hip. Electronically Signed   By: Kathreen Devoid M.D.   On: 09/09/2021 08:30   CT Knee Right Wo Contrast  Result Date: 09/09/2021 CLINICAL DATA:  Right knee pain.  Knee trauma. EXAM: CT OF THE RIGHT KNEE WITHOUT CONTRAST TECHNIQUE: Multidetector CT imaging of the right knee was performed according to the standard protocol. Multiplanar CT image reconstructions were also generated. RADIATION DOSE REDUCTION: This exam was performed according to the departmental dose-optimization program which includes automated exposure control, adjustment of the mA  and/or kV according to patient size and/or use of iterative reconstruction technique. COMPARISON:  None Available. FINDINGS: Bones/Joint/Cartilage Generalized osteopenia. No acute fracture or dislocation. No aggressive osseous lesion. Normal alignment. Large joint effusion. Severe medial femorotibial compartment osteoarthritis with a bone-on-bone appearance, subchondral cystic changes and marginal osteophytes. Moderate-severe osteoarthritis of the lateral femorotibial compartment with small marginal osteophytes. Mild osteoarthritis of the patellofemoral compartment with small marginal osteophytes. Multiple loose bodies throughout the knee joint space. Ligaments Ligaments are suboptimally evaluated by CT. Muscles and Tendons Muscles are normal. No muscle atrophy. No intramuscular fluid collection or hematoma. Patellar tendon and quadriceps tendon are intact. Soft tissue No fluid collection or hematoma. No soft tissue mass. Peripheral vascular atherosclerotic disease. IMPRESSION: 1.  No acute osseous injury of the right knee. 2. Tricompartmental osteoarthritis of the right knee most severe in the medial femorotibial compartment. Electronically Signed   By: Kathreen Devoid M.D.   On: 09/09/2021 08:27   DG Chest 2 View  Result Date: 09/09/2021 CLINICAL DATA:  Fall EXAM: CHEST - 2 VIEW COMPARISON:  07/31/2019 FINDINGS: Lungs are clear.  No pleural effusion or pneumothorax. Cardiomegaly. Degenerative changes of the visualized thoracolumbar spine. IMPRESSION: Cardiomegaly. No evidence of acute cardiopulmonary disease. Electronically Signed   By: Julian Hy M.D.   On: 09/09/2021 04:03   DG Hip Unilat W or Wo Pelvis 2-3 Views Right  Result Date: 09/09/2021 CLINICAL DATA:  Fall EXAM: DG HIP (WITH OR WITHOUT PELVIS) 2-3V RIGHT COMPARISON:  None Available. FINDINGS: Suboptimal visualization due to difficulty with soft tissue penetration. No fracture or dislocation is seen. Bilateral hip joint spaces are preserved.  Visualized bony pelvis appears intact. IMPRESSION: Negative. Electronically Signed   By: Julian Hy M.D.   On: 09/09/2021 04:02   DG Knee Complete 4 Views Right  Result Date: 09/09/2021 CLINICAL DATA:  Fall EXAM: RIGHT KNEE - COMPLETE 4+ VIEW COMPARISON:  12/18/2012 FINDINGS: Severe tricompartmental degenerative changes, most prominent in the medial compartment. This is progressive from 2014. No fracture or dislocation is seen. Suspected small suprapatellar knee joint effusion. IMPRESSION: No fracture or dislocation is seen. Severe tricompartmental degenerative changes, as above. Electronically Signed   By: Julian Hy M.D.   On: 09/09/2021 04:01   CT Head Wo Contrast  Result Date: 09/09/2021 CLINICAL DATA:  Fall EXAM: CT HEAD WITHOUT CONTRAST TECHNIQUE: Contiguous axial images were obtained from the base of the skull through the vertex without intravenous contrast. RADIATION DOSE REDUCTION: This exam was performed according to the departmental dose-optimization program which includes automated exposure control, adjustment of the mA and/or kV according to patient size and/or use of iterative reconstruction technique. COMPARISON:  01/30/2003 FINDINGS: Motion degraded images. Brain: No evidence of acute infarction, hemorrhage, hydrocephalus, extra-axial collection or mass lesion/mass effect. Subcortical white matter and periventricular small vessel ischemic changes. Vascular: Intracranial  atherosclerosis. Skull: Normal. Negative for fracture or focal lesion. Sinuses/Orbits: The visualized paranasal sinuses are essentially clear. The mastoid air cells are unopacified. Other: None. IMPRESSION: Motion degraded images. No evidence of acute intracranial abnormality. Small vessel ischemic changes. Electronically Signed   By: Julian Hy M.D.   On: 09/09/2021 04:00      Subjective: Patient seen and examined at bedside.  Feels okay.  Feels okay to go to rehab today.  No overnight fever,  vomiting, chest pain reported.  Discharge Exam: Vitals:   09/14/21 0621 09/14/21 0924  BP: (!) 182/71 (!) 188/62  Pulse:  66  Resp:    Temp:    SpO2:      General: Pt is alert, awake, not in acute distress.  Looks chronically ill and deconditioned.  Currently on room air. Cardiovascular: rate controlled, S1/S2 + Respiratory: bilateral decreased breath sounds at bases, some bibasilar crackles heard Abdominal: Soft, morbidly obese, NT, ND, bowel sounds + Extremities: Bilateral lower extremity mild edema; no cyanosis    The results of significant diagnostics from this hospitalization (including imaging, microbiology, ancillary and laboratory) are listed below for reference.     Microbiology: Recent Results (from the past 240 hour(s))  Resp Panel by RT-PCR (Flu A&B, Covid)     Status: None   Collection Time: 09/09/21 12:52 PM   Specimen: Nasal Swab  Result Value Ref Range Status   SARS Coronavirus 2 by RT PCR NEGATIVE NEGATIVE Final    Comment: (NOTE) SARS-CoV-2 target nucleic acids are NOT DETECTED.  The SARS-CoV-2 RNA is generally detectable in upper respiratory specimens during the acute phase of infection. The lowest concentration of SARS-CoV-2 viral copies this assay can detect is 138 copies/mL. A negative result does not preclude SARS-Cov-2 infection and should not be used as the sole basis for treatment or other patient management decisions. A negative result may occur with  improper specimen collection/handling, submission of specimen other than nasopharyngeal swab, presence of viral mutation(s) within the areas targeted by this assay, and inadequate number of viral copies(<138 copies/mL). A negative result must be combined with clinical observations, patient history, and epidemiological information. The expected result is Negative.  Fact Sheet for Patients:  EntrepreneurPulse.com.au  Fact Sheet for Healthcare Providers:   IncredibleEmployment.be  This test is no t yet approved or cleared by the [place] FDA and  has been authorized for detection and/or diagnosis of SARS-CoV-2 by FDA under an Emergency Use Authorization (EUA). This EUA will remain  in effect (meaning this test can be used) for the duration of the COVID-19 declaration under Section 564(b)(1) of the Act, 21 U.S.C.section 360bbb-3(b)(1), unless the authorization is terminated  or revoked sooner.       Influenza A by PCR NEGATIVE NEGATIVE Final   Influenza B by PCR NEGATIVE NEGATIVE Final    Comment: (NOTE) The Xpert Xpress SARS-CoV-2/FLU/RSV plus assay is intended as an aid in the diagnosis of influenza from Nasopharyngeal swab specimens and should not be used as a sole basis for treatment. Nasal washings and aspirates are unacceptable for Xpert Xpress SARS-CoV-2/FLU/RSV testing.  Fact Sheet for Patients: EntrepreneurPulse.com.au  Fact Sheet for Healthcare Providers: IncredibleEmployment.be  This test is not yet approved or cleared by the [place] FDA and has been authorized for detection and/or diagnosis of SARS-CoV-2 by FDA under an Emergency Use Authorization (EUA). This EUA will remain in effect (meaning this test can be used) for the duration of the COVID-19 declaration under Section 564(b)(1) of the Act, 21 U.S.C.  section 360bbb-3(b)(1), unless the authorization is terminated or revoked.  Performed at [facility], [place] [address] [place], [place] [address]      Labs: BNP (last 3 results) No results for input(s): "BNP" in the last 8760 hours. Basic Metabolic Panel: Recent Labs  Lab 09/10/21 0545 09/11/21 0531 09/12/21 0531 09/13/21 0516 09/14/21 0451  NA 137 137 136 135 139  K 5.6* 5.4* 5.0 4.9 4.7  CL 113* 112* 110 108 110  CO2 19* 19* 18* 20* 20*  GLUCOSE 126* 95 131* 132* 122*  BUN 37* 38* 42* 47* 50*  CREATININE 1.77*  2.00* 2.14* 2.00* 1.95*  CALCIUM 8.6* 8.6* 8.2* 8.5* 8.8*  MG  --  2.2 2.0 1.9 2.1   Liver Function Tests: Recent Labs  Lab 09/10/21 0545  AST 29  ALT 27  ALKPHOS 114  BILITOT 0.7  PROT 7.8  ALBUMIN 3.4*   No results for input(s): "LIPASE", "AMYLASE" in the last 168 hours. No results for input(s): "AMMONIA" in the last 168 hours. CBC: Recent Labs  Lab 09/09/21 1252 09/14/21 0451  WBC 10.4 8.3  NEUTROABS 6.9 4.8  HGB 12.1 11.6*  HCT 39.0 36.8  MCV 98.2 95.8  PLT 199 227   Cardiac Enzymes: No results for input(s): "CKTOTAL", "CKMB", "CKMBINDEX", "TROPONINI" in the last 168 hours. BNP: Invalid input(s): "POCBNP" CBG: Recent Labs  Lab 09/13/21 1128 09/13/21 1213 09/13/21 1643 09/13/21 2115 09/14/21 0730  GLUCAP 152* 126* 146* 131* 122*   D-Dimer No results for input(s): "DDIMER" in the last 72 hours. Hgb A1c No results for input(s): "HGBA1C" in the last 72 hours. Lipid Profile No results for input(s): "CHOL", "HDL", "LDLCALC", "TRIG", "CHOLHDL", "LDLDIRECT" in the last 72 hours. Thyroid function studies No results for input(s): "TSH", "T4TOTAL", "T3FREE", "THYROIDAB" in the last 72 hours.  Invalid input(s): "FREET3" Anemia work up No results for input(s): "VITAMINB12", "FOLATE", "FERRITIN", "TIBC", "IRON", "RETICCTPCT" in the last 72 hours. Urinalysis    Component Value Date/Time   COLORURINE YELLOW 09/13/2021 2055   APPEARANCEUR CLEAR 09/13/2021 2055   LABSPEC 1.009 09/13/2021 2055   PHURINE 5.0 09/13/2021 2055   GLUCOSEU NEGATIVE 09/13/2021 2055   HGBUR SMALL (A) 09/13/2021 2055   BILIRUBINUR NEGATIVE 09/13/2021 2055   BILIRUBINUR NEG 11/27/2011 0922   KETONESUR NEGATIVE 09/13/2021 2055   PROTEINUR NEGATIVE 09/13/2021 2055   UROBILINOGEN 1.0 11/27/2011 0922   UROBILINOGEN 0.2 06/29/2009 1325   NITRITE POSITIVE (A) 09/13/2021 2055   LEUKOCYTESUR LARGE (A) 09/13/2021 2055   Sepsis Labs Recent Labs  Lab 09/09/21 1252 09/14/21 0451  WBC 10.4 8.3    Microbiology Recent Results (from the past 240 hour(s))  Resp Panel by RT-PCR (Flu A&B, Covid)     Status: None   Collection Time: 09/09/21 12:52 PM   Specimen: Nasal Swab  Result Value Ref Range Status   SARS Coronavirus 2 by RT PCR NEGATIVE NEGATIVE Final    Comment: (NOTE) SARS-CoV-2 target nucleic acids are NOT DETECTED.  The SARS-CoV-2 RNA is generally detectable in upper respiratory specimens during the acute phase of infection. The lowest concentration of SARS-CoV-2 viral copies this assay can detect is 138 copies/mL. A negative result does not preclude SARS-Cov-2 infection and should not be used as the sole basis for treatment or other patient management decisions. A negative result may occur with  improper specimen collection/handling, submission of specimen other than nasopharyngeal swab, presence of viral mutation(s) within the areas targeted by this assay, and inadequate number of viral copies(<138 copies/mL).  A negative result must be combined with clinical observations, patient history, and epidemiological information. The expected result is Negative.  Fact Sheet for Patients:  EntrepreneurPulse.com.au  Fact Sheet for Healthcare Providers:  IncredibleEmployment.be  This test is no t yet approved or cleared by the [place] FDA and  has been authorized for detection and/or diagnosis of SARS-CoV-2 by FDA under an Emergency Use Authorization (EUA). This EUA will remain  in effect (meaning this test can be used) for the duration of the COVID-19 declaration under Section 564(b)(1) of the Act, 21 U.S.C.section 360bbb-3(b)(1), unless the authorization is terminated  or revoked sooner.       Influenza A by PCR NEGATIVE NEGATIVE Final   Influenza B by PCR NEGATIVE NEGATIVE Final    Comment: (NOTE) The Xpert Xpress SARS-CoV-2/FLU/RSV plus assay is intended as an aid in the diagnosis of influenza from Nasopharyngeal swab  specimens and should not be used as a sole basis for treatment. Nasal washings and aspirates are unacceptable for Xpert Xpress SARS-CoV-2/FLU/RSV testing.  Fact Sheet for Patients: EntrepreneurPulse.com.au  Fact Sheet for Healthcare Providers: IncredibleEmployment.be  This test is not yet approved or cleared by the [place] FDA and has been authorized for detection and/or diagnosis of SARS-CoV-2 by FDA under an Emergency Use Authorization (EUA). This EUA will remain in effect (meaning this test can be used) for the duration of the COVID-19 declaration under Section 564(b)(1) of the Act, 21 U.S.C. section 360bbb-3(b)(1), unless the authorization is terminated or revoked.  Performed at [facility], [place] [address], [place], [place] [address]      Time coordinating discharge: 35 minutes  SIGNED:   Aline August, MD  [facility] 09/14/2021, 9:56 AM

## 2021-09-14 NOTE — Progress Notes (Signed)
Physical Therapy Treatment Patient Details Name: Kaitlin Berry MRN: [id number] DOB: 11/19/1945 Today's Date: 09/14/2021   History of Present Illness Kaitlin Berry is 76 yo female  brought to ED 7/11 after fall at home, negative for fractures, significant arthritis right knee. Pt admitted with Hyperkalemia. PMH: OSA, obesity, chronic heart failure DM, anxiety and depression, arthritis.    PT Comments    Pt up in recliner, reporting she needs to use the restroom. Required min guard for sit to stand and step pivot transfers to/from [facility] with +2 for safety, no physical assist or overt LOB noted. Pt able to complete pericare herself in standing with min guard for safety. No increase in pain reported, pt returned to recliner at end of sessions. Encouraged pt to get up and utilize RW during rehabilitation process, pt verbalized understanding. Discharge destination remains appropriate. We will continue to follow acutely.    Recommendations for follow up therapy are one component of a multi-disciplinary discharge planning process, led by the attending physician.  Recommendations may be updated based on patient status, additional functional criteria and insurance authorization.  Follow Up Recommendations  Skilled nursing-short term rehab (<3 hours/day) Can patient physically be transported by private vehicle: No   Assistance Recommended at Discharge Frequent or constant Supervision/Assistance  Patient can return home with the following Two people to help with bathing/dressing/bathroom;Two people to help with walking and/or transfers;Assistance with cooking/housework;Assist for transportation;Help with stairs or ramp for entrance   Equipment Recommendations  None recommended by PT    Recommendations for Other Services       Precautions / Restrictions Precautions Precautions: Fall Precaution Comments: right knee does not fully extend Restrictions Weight Bearing Restrictions: Yes RLE Weight  Bearing: Weight bearing as tolerated     Mobility  Bed Mobility               General bed mobility comments: Pt seated in recliner upon entry and exit    Transfers Overall transfer level: Needs assistance Equipment used: Rolling walker (2 wheels) Transfers: Sit to/from Stand, Bed to chair/wheelchair/BSC Sit to Stand: Min guard, +2 safety/equipment   Step pivot transfers: Min guard, +2 safety/equipment       General transfer comment: Sit to stand: min guard +2 for safety and VCs for sequencing, no physical assist required using RW. Step pivot: min guard +2 for safety with VCs for sequencing, no physical assist required or overt LOB    Ambulation/Gait               General Gait Details: unable   Stairs             Wheelchair Mobility    Modified Rankin (Stroke Patients Only)       Balance Overall balance assessment: History of Falls Sitting-balance support: Feet unsupported, Bilateral upper extremity supported Sitting balance-Leahy Scale: Poor Sitting balance - Comments: requires support, body habitus limiting and feet not reaching floor.   Standing balance support: Bilateral upper extremity supported, Reliant on assistive device for balance, During functional activity Standing balance-Leahy Scale: Poor                              Cognition Arousal/Alertness: Awake/alert Behavior During Therapy: WFL for tasks assessed/performed Overall Cognitive Status: Within Functional Limits for tasks assessed                   Orientation Level: Time  Exercises      General Comments        Pertinent Vitals/Pain Pain Assessment Pain Assessment: Faces Faces Pain Scale: Hurts little more Pain Location: right knee and right hip Pain Descriptors / Indicators: Aching, Discomfort, Tender Pain Intervention(s): Limited activity within patient's tolerance, Monitored during session, Repositioned    Home  Living                          Prior Function            PT Goals (current goals can now be found in the care plan section) Acute Rehab PT Goals Patient Stated Goal: I want to go home PT Goal Formulation: With patient/family Time For Goal Achievement: 09/23/21 Potential to Achieve Goals: Fair Progress towards PT goals: Progressing toward goals    Frequency    Min 2X/week      PT Plan Current plan remains appropriate    Co-evaluation              AM-PAC PT "6 Clicks" Mobility   Outcome Measure  Help needed turning from your back to your side while in a flat bed without using bedrails?: A Lot Help needed moving from lying on your back to sitting on the side of a flat bed without using bedrails?: A Lot Help needed moving to and from a bed to a chair (including a wheelchair)?: Total Help needed standing up from a chair using your arms (e.g., wheelchair or bedside chair)?: Total Help needed to walk in hospital room?: Total Help needed climbing 3-5 steps with a railing? : Total 6 Click Score: 8    End of Session Equipment Utilized During Treatment: Gait belt Activity Tolerance: Patient limited by fatigue;Patient limited by pain Patient left: in chair;with call bell/phone within reach;with chair alarm set;with family/visitor present Nurse Communication: Mobility status;Need for lift equipment PT Visit Diagnosis: Muscle weakness (generalized) (M62.81);History of falling (Z91.81);Pain Pain - Right/Left: Right Pain - part of body: Leg;Knee     Time: 1141-1157 PT Time Calculation (min) (ACUTE ONLY): 16 min  Charges:  $Therapeutic Activity: 8-22 mins                    Coolidge Breeze, PT, DPT [facility] Department Office: [phone] Pager: [phone]   Coolidge Breeze 09/14/2021, 1:18 PM

## 2021-09-14 NOTE — TOC Transition Note (Signed)
Transition of Care [facility]) - CM/SW Discharge Note   Patient Details  Name: Kaitlin Berry MRN: [id number] Date of Birth: 06-18-1945  Transition of Care [facility]) CM/SW Contact:  Vassie Moselle, LCSW Phone Number: 09/14/2021, 11:54 AM   Clinical Narrative:    Pt is to discharge to [facility] room 304A. Nurses to call report to [phone]. Pt and spouse aware of transfer. PTAR has been called for transportation.   Final next level of care: Skilled Nursing Facility Barriers to Discharge: Barriers Resolved   Patient Goals and CMS Choice Patient states their goals for this hospitalization and ongoing recovery are:: To return home   Choice offered to / list presented to : Patient, Spouse  Discharge Placement PASRR number recieved: 09/10/21            Patient chooses bed at: [facility] Patient to be transferred to facility by: [place] Name of family member notified: Willeen Cass Patient and family notified of of transfer: 09/14/21  Discharge Plan and Services In-house Referral: Clinical Social Work Discharge Planning Services: CM Consult Post Acute Care Choice: [place]          DME Arranged: N/A DME Agency: NA                  Social Determinants of Health (SDOH) Interventions     Readmission Risk Interventions    09/14/2021   11:52 AM  Readmission Risk Prevention Plan  Post Dischage Appt Complete  Medication Screening Complete  Transportation Screening Complete

## 2021-09-14 NOTE — Progress Notes (Signed)
Pt dressed, iv removed, PTAR in room

## 2021-09-15 LAB — URINE CULTURE: Culture: >=100000 — AB

## 2021-09-22 NOTE — Progress Notes (Unsigned)
Cardiology Office Note:    Date:  09/23/2021   Kaitlin Berry, DOB 15-May-1945, MRN [id number]  PCP:  Christain Sacramento, Berry [place] Cardiologist: Kaitlin Hew, Berry   Reason for visit: Hospital follow-up  History of Present Illness:    Kaitlin Berry is a 76 y.o. female with a hx of chronic diastolic heart failure, diabetes, hypertension, hyperlipidemia, sleep apnea, chronic back pain limiting walking, CKD with creatinine 1.7-1.9.Kaitlin Berry  She last saw Kaitlin Berry in May 2023.  She is noted to have significant immobility and deconditioning, in a wheelchair.  Was using CPAP.  She has dyspnea with minimal exertion.  Her blood pressure was high off Coreg.  She was restarted on Coreg.  Coronary Calcium Score was 100 with no significant CAD noted on CT scan.    She saw Darcus Pester Pharm.D. on August 17, 2021.  To get blood pressure less than 130/80, she was started on spironolactone 12.5 mg daily.  She was admitted to the hospital July 12 to September 14, 2021 after having a fall at home while rushing to the bathroom secondary to the diarrhea from her IBS.  She fell and hit her knee.  On presentation, potassium was 6.5, bicarb of 17, creatinine of 1.77. She was treated with IV sodium bicarbonate/insulin/calcium gluconate in the ED and subsequently also received Lokelma.  Losartan & spironolactone discontinued.  Lasix 40 mg daily was started on July 15 and recommended to have outpatient follow-up with nephrology - Dr. Gean Berry.  Today, she feels better.  She was in SNF for 4 days and now is able to walk to bathroom at back.  She has improved but is not back to her mobility baseline.    She states she has been taking losartan and was not aware that this was discontinued on her discharge summary.  She stopped taking spironolactone.  She denies significant dyspnea.  No chest pain or palpitations.  Her lower extremity edema has improved.  She is now taking Lasix 40 mg as needed.  She uses  CPAP.  She has a follow-up with nephrology in August.     Past Medical History:  Diagnosis Date   Anxiety    Arthritis    rt knee   BACK PAIN 03/24/2007   Qualifier: Diagnosis of  By: Kaitlin Berry, Kaitlin Berry    Chronic back pain    DEPRESSION 03/24/2007   Qualifier: Diagnosis of  By: Kaitlin Berry, Kaitlin Berry    Diabetes mellitus without complication, with long-term current use of insulin ([place])    GERD (gastroesophageal reflux disease)    Hyperlipidemia    HYPERSOMNIA 03/24/2007   Qualifier: Diagnosis of  By: Kaitlin Berry, Kaitlin Berry    Hypertension    Morbid obesity with BMI of 45.0-49.9, adult ([place])    OBSTRUCTIVE SLEEP APNEA 05/17/2007   severe with AHI 35.1 on PSG 2009 - on CPAP   Psoriasis    Urticaria 11/27/2011    Past Surgical History:  Procedure Laterality Date   ABDOMINAL HYSTERECTOMY     BACK SURGERY     lumbar   BREAST LUMPECTOMY WITH RADIOACTIVE SEED LOCALIZATION Right 10/01/2014   Procedure: RIGHT BREAST LUMPECTOMY WITH RADIOACTIVE SEED LOCALIZATION;  Surgeon: Kaitlin Mesa, Berry;  Location: [place];  Service: General;  Laterality: Right;   CHOLECYSTECTOMY     CT CTA CORONARY W/CA SCORE W/CM &/OR WO/CM  06/12/2021   Coronary Calcium Score 100.  Minimal nonobstructive CAD (1 to  24%).  Consider nonatherosclerotic cause of chest pain or dyspnea.  Normal pulmonary vein drainage.  Normal left atrial appendage with no thrombus.  No significant noncardiac findings.   INCISIONAL BREAST BIOPSY     multiple   NM MYOVIEW LTD  11/2016   Normal LVEF - NO ISCHEMIA OR INFARCTION -- LOW RISK / NORMAL   SPINE SURGERY     TRANSTHORACIC ECHOCARDIOGRAM  11/29/2016   a) 10/'18: EF 60-65%.  Nl LV size w/ mild LVH.  No RWMA.  GR 1 DD & Nl LA size.  Mild MAC.  Trivial MR.  Mildly calcified ApV (sclerosis w/o stenosis).  Nl RV size & fxn.  Nl RVP & RAP.;; b) 08/01/2019: EF 60-65%.  No RWMA.  Nl LV size & fxn.  GR 1 DD.  Nl LA size.  No MR.  Mild AoV sclerosis w/o stenosis.  Nl RV size &  fxn.  Nl RAP.  Unable to assess PA.-Poor windows.   TRANSTHORACIC ECHOCARDIOGRAM  06/16/2021   EF 70 to 75%.  Hyperdynamic LV with no RWMA.  Moderate LVH.  GR 1 DD.  Mild LA dilation.  Normal valves.    Current Medications: Current Meds  Medication Sig   blood glucose meter kit and supplies KIT Dispense based on patient and insurance preference. Use up to four times daily as directed. (FOR ICD-9 250.00, 250.01).   citalopram (CELEXA) 40 MG tablet Take 40 mg by mouth daily.   Dulaglutide (TRULICITY) 5.40 GQ/6.7YP SOPN Inject 0.75 mg into the skin once a week.   furosemide (LASIX) 40 MG tablet Take 1 tablet (40 mg total) by mouth daily.   HYDROcodone-acetaminophen (NORCO) 10-325 MG tablet Take 1 tablet by mouth daily as needed.   insulin degludec (TRESIBA FLEXTOUCH) 100 UNIT/ML FlexTouch Pen Inject 35 Units into the skin daily.   losartan (COZAAR) 50 MG tablet Take 50 mg by mouth daily. Take 1 Tablet Daily   SKYRIZI PEN 150 MG/ML SOAJ INJECT 150MG (1 PEN) SUBCUTANEOUSLY EVERY 12 WEEKS AS DIRECTED. (Patient taking differently: Inject 150 mg into the skin every 3 (three) months. 3 months=12 Weeks)   sodium bicarbonate 650 MG tablet Take 1 tablet (650 mg total) by mouth 3 (three) times daily.   triamcinolone cream (KENALOG) 0.1 % Apply 1 Application topically daily as needed (flare up).   vitamin B-12 (CYANOCOBALAMIN) 1000 MCG tablet Take 1,000 mcg by mouth daily.   [DISCONTINUED] carvedilol (COREG) 3.125 MG tablet Take 2 tablets (6.25 mg total) by mouth 2 (two) times daily.     Allergies:   Atorvastatin, Ciprofloxacin, Ciprofloxacin hcl, Spironolactone, and Lisinopril   Social History   Socioeconomic History   Marital status: Married    Spouse name: Not on file   Number of children: Not on file   Years of education: Not on file   Highest education level: Not on file  Occupational History   Not on file  Tobacco Use   Smoking status: Never   Smokeless tobacco: Never  Vaping Use    Vaping Use: Never used  Substance and Sexual Activity   Alcohol use: Yes    Comment: social   Drug use: No   Sexual activity: Not on file  Other Topics Concern   Not on file  Social History Narrative   Not on file   Social Determinants of Health   Financial Resource Strain: Not on file  Food Insecurity: Not on file  Transportation Needs: Not on file  Physical Activity: Not on file  Stress:  Not on file  Social Connections: Not on file     Family History: The patient's family history includes Alzheimer's disease in her mother; Cancer in her father; Diabetes in her brother and father; Heart attack in her brother; Heart disease in her father; Lung cancer in her brother; Stroke in her brother.  ROS:   Please see the history of present illness.     EKGs/Labs/Other Studies Reviewed:    EKG:  The ekg ordered today demonstrates sinus bradycardia, heart rate 59.  Recent Labs: 09/10/2021: ALT 27 09/14/2021: BUN 50; Creatinine, Ser 1.95; Hemoglobin 11.6; Magnesium 2.1; Platelets 227; Potassium 4.7; Sodium 139   Recent Lipid Panel Lab Results  Component Value Date/Time   CHOL 125 08/13/2021 12:15 PM   TRIG 130 08/13/2021 12:15 PM   HDL 33 (L) 08/13/2021 12:15 PM   LDLCALC 69 08/13/2021 12:15 PM    Physical Exam:    VS:  BP 134/76 (BP Location: Left Arm, Patient Position: Sitting, Cuff Size: Large)   Pulse (!) 59   Resp 20   Ht _0  (1.626 m)   Wt 280 lb (127 kg)   SpO2 96%   BMI 48.06 kg/m    No data found.     Wt Readings from Last 3 Encounters:  09/23/21 280 lb (127 kg)  09/09/21 286 lb (129.7 kg)  07/06/21 291 lb 3.2 oz (132.1 kg)     GEN:  Well nourished, well developed in no acute distress, obese, in a wheelchair HEENT: Normal NECK: No JVD; No carotid bruits CARDIAC: RRR, no murmurs, rubs, gallops RESPIRATORY:  Clear to auscultation without rales, wheezing or rhonchi  ABDOMEN: Soft, non-tender, non-distended MUSCULOSKELETAL: No edema; No deformity  SKIN:  Warm and dry NEUROLOGIC:  Alert and oriented PSYCHIATRIC:  Normal affect     ASSESSMENT AND PLAN   Hyperkalemia -After starting spironolactone 12.5 mg daily.  She was admitted for a fall before she was having follow-up blood work.  Spironolactone discontinued. -Potassium normal on discharge.  Recheck today.  Chronic diastolic heart failure, euvolemic -2D echo April 2023 with EF 70 to 76%, grade 1 diastolic dysfunction, moderate LVH -Continue losartan 41m daily.  Check renal function today. -We will add spironolactone to her allergy list given significant hyperkalemia after its initiation. -Continue Lasix 40 mg as needed -gave her heart failure zone patient instruction sheet about when to take Lasix.  Hypertension, reasonably controlled -Meds 6/19: Coreg 6.25 twice daily, losartan 50 mg daily, spironolactone 12.5 mg daily.  Patient admitted July for fall, K 6.5 --losartan and spironolactone discontinued.  Patient continue losartan. -They state systolic blood pressure 1720Nto 140s at SNF.   -Recommend checking blood pressure 3 times a week and log.  Bring blood pressure log and wrist cuff to next appointment.  If systolic blood pressure remains elevated, consider adding amlodipine. -Goal BP is <130/80.  Recommend DASH diet (high in vegetables, fruits, low-fat dairy products, whole grains, poultry, fish, and nuts and low in sweets, sugar-sweetened beverages, and red meats), salt restriction and increase physical activity.  Coronary artery disease, no angina -Coronary Calcium Score was 100, mild disease -LDL goal <100 -LDL 69 in June 2023.   Disposition - Follow-up in November with Dr. HEllyn Hackas scheduled.   Medication Adjustments/Labs and Tests Ordered: Current medicines are reviewed at length with the patient today.  Concerns regarding medicines are outlined above.  Orders Placed This Encounter  Procedures   Basic metabolic panel   EKG [id number]  Meds ordered this  encounter   Medications   carvedilol (COREG) 3.125 MG tablet    Sig: Take 2 tablets (6.25 mg total) by mouth 2 (two) times daily.    Dispense:  120 tablet    Refill:  3    Patient Instructions  Medication Instructions:  No changes *If you need a refill on your cardiac medications before your next appointment, please call your pharmacy*   Lab Work: BMET Today. If you have labs (blood work) drawn today and your tests are completely normal, you will receive your results only by: [place] (if you have MyChart) OR A paper copy in the mail If you have any lab test that is abnormal or we need to change your treatment, we will call you to review the results.   Testing/Procedures: No Testing   Follow-Up: At [facility], you and your health needs are our priority.  As part of our continuing mission to provide you with exceptional heart care, we have created designated Provider Care Teams.  These Care Teams include your primary Cardiologist (physician) and Advanced Practice Providers (APPs -  Physician Assistants and Nurse Practitioners) who all work together to provide you with the care you need, when you need it.  We recommend signing up for the patient portal called "MyChart".  Sign up information is provided on this After Visit Summary.  MyChart is used to connect with patients for Virtual Visits (Telemedicine).  Patients are able to view lab/test results, encounter notes, upcoming appointments, etc.  Non-urgent messages can be sent to your provider as well.   To learn more about what you can do with MyChart, go to [email or web address].    Your next appointment:   Keep Scheduled Appointment  The format for your next appointment:   In Person  Provider:   Glenetta Hew, Berry     Other Instructions Please Bring Blood Pressure Log and Blood Pressure cuff to Follow-up visit.  Important Information About Sugar         Signed, Gaston Islam  09/23/2021 9:18 AM     [place]

## 2021-09-23 ENCOUNTER — Encounter: Payer: Self-pay | Admitting: Physician Assistant

## 2021-09-23 ENCOUNTER — Ambulatory Visit: Payer: Medicare HMO | Admitting: Physician Assistant

## 2021-09-23 VITALS — BP 134/76 | HR 59 | Resp 20 | Ht 64.0 in | Wt 280.0 lb

## 2021-09-23 DIAGNOSIS — I10 Essential (primary) hypertension: Secondary | ICD-10-CM

## 2021-09-23 DIAGNOSIS — E875 Hyperkalemia: Secondary | ICD-10-CM

## 2021-09-23 DIAGNOSIS — I5032 Chronic diastolic (congestive) heart failure: Secondary | ICD-10-CM | POA: Diagnosis not present

## 2021-09-23 LAB — BASIC METABOLIC PANEL
BUN/Creatinine Ratio: 22 (ref 12–28)
BUN: 35 mg/dL — ABNORMAL HIGH (ref 8–27)
CO2: 17 mmol/L — ABNORMAL LOW (ref 20–29)
Calcium: 8.6 mg/dL — ABNORMAL LOW (ref 8.7–10.3)
Chloride: 111 mmol/L — ABNORMAL HIGH (ref 96–106)
Creatinine, Ser: 1.58 mg/dL — ABNORMAL HIGH (ref 0.57–1.00)
Glucose: 166 mg/dL — ABNORMAL HIGH (ref 70–99)
Potassium: 5.1 mmol/L (ref 3.5–5.2)
Sodium: 141 mmol/L (ref 134–144)
eGFR: 34 mL/min/{1.73_m2} — ABNORMAL LOW (ref >59)

## 2021-09-23 MED ORDER — CARVEDILOL 3.125 MG PO TABS: 6.2500 mg | ORAL_TABLET | Freq: Two times a day (BID) | ORAL | 3 refills | Status: DC

## 2021-09-23 MED ORDER — CARVEDILOL 6.25 MG PO TABS: 6.2500 mg | ORAL_TABLET | Freq: Two times a day (BID) | ORAL | 3 refills | Status: DC

## 2021-09-23 NOTE — Addendum Note (Signed)
Addended by: Merri Ray A on: 09/23/2021 09:21 AM   Modules accepted: Orders

## 2021-09-23 NOTE — Patient Instructions (Signed)
Medication Instructions:  No changes *If you need a refill on your cardiac medications before your next appointment, please call your pharmacy*   Lab Work: BMET Today. If you have labs (blood work) drawn today and your tests are completely normal, you will receive your results only by: [place] (if you have MyChart) OR A paper copy in the mail If you have any lab test that is abnormal or we need to change your treatment, we will call you to review the results.   Testing/Procedures: No Testing   Follow-Up: At [facility], you and your health needs are our priority.  As part of our continuing mission to provide you with exceptional heart care, we have created designated Provider Care Teams.  These Care Teams include your primary Cardiologist (physician) and Advanced Practice Providers (APPs -  Physician Assistants and Nurse Practitioners) who all work together to provide you with the care you need, when you need it.  We recommend signing up for the patient portal called "MyChart".  Sign up information is provided on this After Visit Summary.  MyChart is used to connect with patients for Virtual Visits (Telemedicine).  Patients are able to view lab/test results, encounter notes, upcoming appointments, etc.  Non-urgent messages can be sent to your provider as well.   To learn more about what you can do with MyChart, go to [email or web address].    Your next appointment:   Keep Scheduled Appointment  The format for your next appointment:   In Person  Provider:   Glenetta Hew, MD     Other Instructions Please Bring Blood Pressure Log and Blood Pressure cuff to Follow-up visit.  Important Information About Sugar

## 2021-09-24 ENCOUNTER — Telehealth: Payer: Self-pay

## 2021-09-24 NOTE — Telephone Encounter (Addendum)
Called patient regarding results. Unable to leave message or patient to call office.----- Message from Warren Lacy, PA-C sent at 09/24/2021  8:11 AM EDT ----- Kidney function improved compared to July 17.  Creatinine down to 1.58 -- better than baseline: 1.7-1.9. Potassium normal.  Recommendations: Continue current medications.

## 2021-09-29 ENCOUNTER — Telehealth: Payer: Self-pay

## 2021-09-29 MED ORDER — SKYRIZI PEN 150 MG/ML ~~LOC~~ SOAJ: SUBCUTANEOUS | 6 refills | Status: DC

## 2021-09-29 MED ORDER — SKYRIZI PEN 150 MG/ML ~~LOC~~ SOAJ: 1.0000 | SUBCUTANEOUS | 6 refills | Status: DC

## 2021-09-29 NOTE — Addendum Note (Signed)
Addended by: Sheran Lawless on: 09/29/2021 12:11 PM   Modules accepted: Orders

## 2021-09-29 NOTE — Telephone Encounter (Signed)
Refill ok? 

## 2021-10-07 ENCOUNTER — Telehealth: Payer: Self-pay

## 2021-10-07 NOTE — Telephone Encounter (Addendum)
Called patient regarding results. Unable to leave message . Letter mailed out 10/07/2021----- Message from Warren Lacy, PA-C sent at 09/24/2021  8:11 AM EDT ----- Kidney function improved compared to July 17.  Creatinine down to 1.58 -- better than baseline: 1.7-1.9. Potassium normal.  Recommendations: Continue current medications.

## 2021-11-16 ENCOUNTER — Encounter: Payer: Self-pay | Admitting: Cardiology

## 2022-01-08 ENCOUNTER — Ambulatory Visit: Payer: Medicare HMO | Attending: Cardiology | Admitting: Cardiology

## 2022-01-14 ENCOUNTER — Encounter: Payer: Self-pay | Admitting: Cardiology

## 2022-02-06 NOTE — Progress Notes (Signed)
This encounter was created in error - please disregard.

## 2022-02-08 ENCOUNTER — Encounter: Payer: Self-pay | Admitting: Cardiology

## 2022-02-08 ENCOUNTER — Ambulatory Visit: Payer: Medicare HMO | Attending: Cardiology | Admitting: Cardiology

## 2022-02-08 VITALS — BP 122/68 | HR 63 | Ht 64.0 in | Wt 285.0 lb

## 2022-02-08 DIAGNOSIS — E1169 Type 2 diabetes mellitus with other specified complication: Secondary | ICD-10-CM | POA: Diagnosis not present

## 2022-02-08 DIAGNOSIS — E875 Hyperkalemia: Secondary | ICD-10-CM | POA: Diagnosis not present

## 2022-02-08 DIAGNOSIS — I10 Essential (primary) hypertension: Secondary | ICD-10-CM | POA: Diagnosis not present

## 2022-02-08 DIAGNOSIS — I5032 Chronic diastolic (congestive) heart failure: Secondary | ICD-10-CM

## 2022-02-08 DIAGNOSIS — E785 Hyperlipidemia, unspecified: Secondary | ICD-10-CM

## 2022-02-08 NOTE — Progress Notes (Signed)
Primary Care Provider: Christain Sacramento, MD [place] Cardiologist: Glenetta Hew, MD Electrophysiologist: None Nephrologist: Dr. Gean Quint  Clinic Note: Chief Complaint  Patient presents with   Follow-up    5 to 6 months.  Relatively stable.   ===================================  ASSESSMENT/PLAN   Problem List Items Addressed This Visit       Cardiology Problems   Hyperlipidemia associated with type 2 diabetes mellitus (HCC) (Chronic)    With a Coronary Calcium Score of 100, would like to see LDL less than 100. Most recent lipids showed LDL 69 on no medications.  For diabetes she is on Trulicity-before weight loss may benefit more from either Ozempic or Mounjaro. Could also benefit from SGLT2 inhibitors to assist with diuresis.      Essential hypertension - Primary (Chronic)    BP is stable now on carvedilol and losartan along with furosemide.  She is really taking the Lasix as needed.  I would like for her to take at least a couple days a week to avoid aggression and hyperkalemia.       Chronic diastolic heart failure (HCC) (Chronic)    She has been given a diagnosis of chronic diastolic heart failure without really true confirmation of this.  She has hypertension and diastolic dysfunction on echo with moderate LVH.  Continue BP control with carvedilol and ARB.  Would also recommend continuing if not daily, at least 3 to 4 days a week Lasix.  No spironolactone because of hyperkalemia.  Consider SGLT2 inhibitor for additional diuretic effect.  Support stockings if possible to lower EXTR edema.        Other   Hyperkalemia   Relevant Orders   Basic metabolic panel (Completed)   Morbid obesity (HCC) (Chronic)    Discussed portance of dietary modification.  I truthfully she may benefit from switching her GLP-1 agonist to either Mounjaro or Ozempic.  For exercise, recommended water aerobics/water walking      Relevant Orders   Basic metabolic  panel (Completed)    ===================================  HPI:    Kaitlin Berry is a morbidly obese (almost wheelchair-bound) 76 y.o. female with a PMH notable for DM-2, HLD, HTN, OSA on CPAP,?  HFpEF, minimal CAD on Coronary CTA, with chronic back pain and knee pain as well as IBS with chronic diarrhea who presents today for 4-63-monthfollow-up.  Recent Hospitalizations:  7/12-17/2023: See suffered a fall while rushing to the bathroom for IBS related diarrhea.  She felt her knee with severe right knee pain.  Was noted to be hyperkalemic with a potassium of 6.5 and a bicarb of 17, with creatinine 1.77.  No acute fractures noted.  Treated with IV sodium bicarb, insulin and calcium gluconate as well as Lokelma.  Potassium was 4.7 at discharge.  ARB and spironolactone discontinued.  Was started on Lasix 40 mg daily Coreg continued.--discharged to SNF where she spent 4 days.  Kaitlin PLOTTwas last seen on September 23, 2021 by JCaron Presume PA for hospital follow-up.  (I had last seen her in May 23.  I restarted her Coreg.  We reviewed Coronary Calcium Score over 100 with nonischemic CAD on Coronary CTA) -> she was started on spironolactone on July 19 and CVRR.  Unfortunately, she was then admitted shortly thereafter following a bout of diarrhea after fall.  Ended up showing metabolic acidosis with hyperkalemia requiring treatment noted above.  Was switched from spironolactone to Lasix on discharge.  Was able to walk back and forth  to the bath room.  Not back to baseline mobility, but notably improved.  Actually she has been taking her losartan open not understand that this was supposed to stop.  She was not taking spironolactone. => Chemistry levels rechecked.  Continued on losartan, but held spironolactone.  Continued on Lasix.  BPs at home are low 130-140 mm range at SNF.  Next consideration would be to add amlodipine.  Talked about DASH diet.   Reviewed  CV studies:    The following studies  were reviewed today: (if available, images/films reviewed: From Epic Chart or Care Everywhere) No new studies.:  Interval History:   Kaitlin Berry returns here today overall doing okay from a cardiac standpoint.  She has not necessarily been taking Lasix every day =>  has been taking it more as needed basis.  She has not had any further episodes of chest pain or pressure at rest or exertion.  She is not however all that active still try to recover from her fall.  Mobility is still limited, remains mostly in a wheelchair but is able to transfe and backward to the bathroom etc. without much difficulty. No PND orthopnea-sleeps with CPAP.  Trivial swelling.  No arrhythmia symptoms.  No syncope or near syncope.  She is very deconditioned and if she does too much will get short of breath, but this is not new.  No chest pressure or pain.  Also not walking enough to notice any claudication.   REVIEWED OF SYSTEMS   Review of Systems  Constitutional:  Positive for malaise/fatigue (Sedentary, does not do much.). Negative for weight loss.  HENT:  Negative for congestion.   Respiratory:  Positive for shortness of breath (At baseline). Negative for cough.   Cardiovascular:  Positive for leg swelling (Stable).  Gastrointestinal:  Negative for blood in stool and melena.  Genitourinary:  Negative for hematuria.  Musculoskeletal:  Positive for joint pain (Right knee definitely hurts.). Negative for falls (No recent falls) and myalgias.  Neurological:  Positive for dizziness (If she stands up quickly, but is mostly wheelchair-bound.) and weakness (Legs are notably weak.).  Psychiatric/Behavioral:  Negative for depression and memory loss. The patient is not nervous/anxious.     I have reviewed and (if needed) personally updated the patient's problem list, medications, allergies, past medical and surgical history, social and family history.   PAST MEDICAL HISTORY   Past Medical History:  Diagnosis Date    Anxiety    Arthritis    rt knee   BACK PAIN 03/24/2007   Qualifier: Diagnosis of  By: Gwenette Greet MD, Armando Reichert    Chronic back pain    DEPRESSION 03/24/2007   Qualifier: Diagnosis of  By: Gwenette Greet MD, Armando Reichert    Diabetes mellitus without complication, with long-term current use of insulin ([place])    GERD (gastroesophageal reflux disease)    Hyperlipidemia    HYPERSOMNIA 03/24/2007   Qualifier: Diagnosis of  By: Gwenette Greet MD, Armando Reichert    Hypertension    Morbid obesity with BMI of 45.0-49.9, adult ([place])    OBSTRUCTIVE SLEEP APNEA 05/17/2007   severe with AHI 35.1 on PSG 2009 - on CPAP   Psoriasis    Urticaria 11/27/2011    PAST SURGICAL HISTORY   Past Surgical History:  Procedure Laterality Date   ABDOMINAL HYSTERECTOMY     BACK SURGERY     lumbar   BREAST LUMPECTOMY WITH RADIOACTIVE SEED LOCALIZATION Right 10/01/2014   Procedure: RIGHT BREAST LUMPECTOMY WITH RADIOACTIVE SEED LOCALIZATION;  Surgeon: Donnie Mesa, MD;  Location: [facility];  Service: General;  Laterality: Right;   CHOLECYSTECTOMY     CT CTA CORONARY W/CA SCORE W/CM &/OR WO/CM  06/12/2021   Coronary Calcium Score 100.  Minimal nonobstructive CAD (1 to 24%).  Consider nonatherosclerotic cause of chest pain or dyspnea.  Normal pulmonary vein drainage.  Normal left atrial appendage with no thrombus.  No significant noncardiac findings.   INCISIONAL BREAST BIOPSY     multiple   NM MYOVIEW LTD  11/2016   Normal LVEF - NO ISCHEMIA OR INFARCTION -- LOW RISK / NORMAL   SPINE SURGERY     TRANSTHORACIC ECHOCARDIOGRAM  11/29/2016   a) 10/'18: EF 60-65%.  Nl LV size w/ mild LVH.  No RWMA.  GR 1 DD & Nl LA size.  Mild MAC.  Trivial MR.  Mildly calcified ApV (sclerosis w/o stenosis).  Nl RV size & fxn.  Nl RVP & RAP.;; b) 08/01/2019: EF 60-65%.  No RWMA.  Nl LV size & fxn.  GR 1 DD.  Nl LA size.  No MR.  Mild AoV sclerosis w/o stenosis.  Nl RV size & fxn.  Nl RAP.  Unable to assess PA.-Poor windows.   TRANSTHORACIC  ECHOCARDIOGRAM  06/16/2021   EF 70 to 75%.  Hyperdynamic LV with no RWMA.  Moderate LVH.  GR 1 DD.  Mild LA dilation.  Normal valves.    Immunization History  Administered Date(s) Administered   Influenza Split 11/27/2011   Pneumococcal Polysaccharide-23 08/04/2019    MEDICATIONS/ALLERGIES   Current Meds  Medication Sig   blood glucose meter kit and supplies KIT Dispense based on patient and insurance preference. Use up to four times daily as directed. (FOR ICD-9 250.00, 250.01).   carvedilol (COREG) 6.25 MG tablet Take 1 tablet (6.25 mg total) by mouth 2 (two) times daily.   citalopram (CELEXA) 40 MG tablet Take 40 mg by mouth daily.   Dulaglutide (TRULICITY) 3.29 JJ/8.8CZ SOPN Inject 0.75 mg into the skin once a week.   furosemide (LASIX) 40 MG tablet Take 1 tablet (40 mg total) by mouth daily.   HYDROcodone-acetaminophen (NORCO) 10-325 MG tablet Take 1 tablet by mouth daily as needed.   insulin degludec (TRESIBA FLEXTOUCH) 100 UNIT/ML FlexTouch Pen Inject 35 Units into the skin daily.   losartan (COZAAR) 50 MG tablet Take 50 mg by mouth daily. Take 1 Tablet Daily   Risankizumab-rzaa (SKYRIZI PEN) 150 MG/ML SOAJ INJECT 150MG (1 PEN) SUB Q EVERY 12 WEEKS   sodium bicarbonate 650 MG tablet Take 1 tablet (650 mg total) by mouth 3 (three) times daily.   triamcinolone cream (KENALOG) 0.1 % Apply 1 Application topically daily as needed (flare up).   vitamin B-12 (CYANOCOBALAMIN) 1000 MCG tablet Take 1,000 mcg by mouth daily.    Allergies  Allergen Reactions   Atorvastatin Other (See Comments)   Ciprofloxacin Other (See Comments) and Hives    Unknown   Ciprofloxacin Hcl Hives   Spironolactone     hyperkalemia   Lisinopril Rash    SOCIAL HISTORY/FAMILY HISTORY   Reviewed in Epic:  Pertinent findings:  Social History   Tobacco Use   Smoking status: Never   Smokeless tobacco: Never  Vaping Use   Vaping Use: Never used  Substance Use Topics   Alcohol use: Yes    Comment:  social   Drug use: No   Social History   Social History Narrative   Not on file    OBJCTIVE -PE,  EKG, labs   Wt Readings from Last 3 Encounters:  02/08/22 285 lb (129.3 kg)  09/23/21 280 lb (127 kg)  09/09/21 286 lb (129.7 kg)    Physical Exam: BP 122/68 (BP Location: Left Arm, Patient Position: Sitting, Cuff Size: Large)   Pulse 63   Ht _0  (1.626 m)   Wt 285 lb (129.3 kg)   SpO2 97%   BMI 48.92 kg/m  Physical Exam Vitals reviewed.  Constitutional:      General: She is not in acute distress.    Appearance: She is ill-appearing (Chronically ill-appearing.  Morbidly obese.  Mostly wheelchair-bound.). She is not toxic-appearing.     Comments: Sitting in wheelchair.  HENT:     Head: Normocephalic and atraumatic.  Neck:     Vascular: No carotid bruit or JVD (Unable to assess).  Cardiovascular:     Rate and Rhythm: Normal rate and regular rhythm. No extrasystoles are present.    Chest Wall: PMI is not displaced (Unable to assess).     Pulses: Decreased pulses (Diminished due to body habitus, puffy bilateral LE swelling.).     Heart sounds: S1 normal and S2 normal. Heart sounds are distant. No murmur heard.    No friction rub. No gallop.  Pulmonary:     Effort: Pulmonary effort is normal. No respiratory distress.     Comments: Distant breath sounds but no wheezes rales or rhonchi. Musculoskeletal:        General: Swelling (Trivial to 1+ puffy bilateral lower extremity edema.) present.     Cervical back: Normal range of motion and neck supple.  Skin:    General: Skin is warm and dry.  Neurological:     General: No focal deficit present.     Mental Status: She is alert and oriented to person, place, and time.     Gait: Gait abnormal (Wheelchair).  Psychiatric:        Mood and Affect: Mood normal.        Behavior: Behavior normal.        Thought Content: Thought content normal.        Judgment: Judgment normal.     Adult ECG Report Not checked  Recent Labs:    Lab Results  Component Value Date   CREATININE 1.58 (H) 09/23/2021 - K+ 5.1   CREATININE 1.95 (H) 09/14/2021 - K+ 4.7     Lab Results  Component Value Date   CHOL 125 08/13/2021   HDL 33 (L) 08/13/2021   LDLCALC 69 08/13/2021   TRIG 130 08/13/2021   CHOLHDL 3.8 08/13/2021       Latest Ref Rng & Units 09/14/2021    4:51 AM 09/09/2021   12:52 PM 04/23/2020   10:44 AM  CBC  WBC 4.0 - 10.5 K/uL 8.3  10.4  12.8   Hemoglobin 12.0 - 15.0 g/dL 11.6  12.1  11.7   Hematocrit 36.0 - 46.0 % 36.8  39.0  35.5   Platelets 150 - 400 K/uL 227  199  285     Lab Results  Component Value Date   HGBA1C 7.8 (H) 08/13/2021   No results found for: "TSH"  ================================================== I spent a total of 21 minutes with the patient spent in direct patient consultation.  Additional time spent with chart review  / charting (studies, outside notes, etc): 14 min Total Time: 35 min  Current medicines are reviewed at length with the patient today.  (+/- concerns) N/A  Notice: This dictation was prepared with  Sales executive along with Engineer, materials. Any transcriptional errors that result from this process are unintentional and may not be corrected upon review.  Studies Ordered:   Orders Placed This Encounter  Procedures   Basic metabolic panel   No orders of the defined types were placed in this encounter.   Patient Instructions / Medication Changes & Studies & Tests Ordered   Patient Instructions  Medication Instructions:   Not needed *If you need a refill on your cardiac medications before your next appointment, please call your pharmacy*   Lab Work: BMP  If you have labs (blood work) drawn today and your tests are completely normal, you will receive your results only by: MyChart Message (if you have MyChart) OR A paper copy in the mail If you have any lab test that is abnormal or we need to change your treatment, we will call you to review the  results.   Testing/Procedures: Not needed   Follow-Up: At [facility], you and your health needs are our priority.  As part of our continuing mission to provide you with exceptional heart care, we have created designated Provider Care Teams.  These Care Teams include your primary Cardiologist (physician) and Advanced Practice Providers (APPs -  Physician Assistants and Nurse Practitioners) who all work together to provide you with the care you need, when you need it.     Your next appointment:   6 month(s)  The format for your next appointment:   In Person  Provider:   Glenetta Hew, MD    Other Instructions   Recommend  looking into doing water aerobics      Leonie Man, MD, MS Glenetta Hew, M.D., M.S. Interventional Cardiologist  [facility]  Pager # [phone] Phone # [phone] [address]. [place], [place] [address]   Thank you for choosing [facility]!!

## 2022-02-08 NOTE — Patient Instructions (Addendum)
Medication Instructions:   Not needed *If you need a refill on your cardiac medications before your next appointment, please call your pharmacy*   Lab Work: BMP  If you have labs (blood work) drawn today and your tests are completely normal, you will receive your results only by: [place] (if you have MyChart) OR A paper copy in the mail If you have any lab test that is abnormal or we need to change your treatment, we will call you to review the results.   Testing/Procedures: Not needed   Follow-Up: At [facility], you and your health needs are our priority.  As part of our continuing mission to provide you with exceptional heart care, we have created designated Provider Care Teams.  These Care Teams include your primary Cardiologist (physician) and Advanced Practice Providers (APPs -  Physician Assistants and Nurse Practitioners) who all work together to provide you with the care you need, when you need it.     Your next appointment:   6 month(s)  The format for your next appointment:   In Person  Provider:   Glenetta Hew, MD    Other Instructions   Recommend  looking into doing water aerobics

## 2022-02-11 LAB — BASIC METABOLIC PANEL
BUN/Creatinine Ratio: 17 (ref 12–28)
BUN: 26 mg/dL (ref 8–27)
CO2: 17 mmol/L — ABNORMAL LOW (ref 20–29)
Calcium: 8.1 mg/dL — ABNORMAL LOW (ref 8.7–10.3)
Chloride: 106 mmol/L (ref 96–106)
Creatinine, Ser: 1.56 mg/dL — ABNORMAL HIGH (ref 0.57–1.00)
Glucose: 260 mg/dL — ABNORMAL HIGH (ref 70–99)
Potassium: 5 mmol/L (ref 3.5–5.2)
Sodium: 136 mmol/L (ref 134–144)
eGFR: 34 mL/min/{1.73_m2} — ABNORMAL LOW (ref >59)

## 2022-02-18 ENCOUNTER — Encounter: Payer: Self-pay | Admitting: *Deleted

## 2022-02-28 ENCOUNTER — Encounter: Payer: Self-pay | Admitting: Cardiology

## 2022-02-28 NOTE — Assessment & Plan Note (Signed)
With a Coronary Calcium Score of 100, would like to see LDL less than 100. Most recent lipids showed LDL 69 on no medications.  For diabetes she is on Trulicity-before weight loss may benefit more from either Ozempic or Mounjaro. Could also benefit from SGLT2 inhibitors to assist with diuresis.

## 2022-02-28 NOTE — Assessment & Plan Note (Addendum)
Discussed portance of dietary modification.  I truthfully she may benefit from switching her GLP-1 agonist to either Mounjaro or Ozempic.  For exercise, recommended water aerobics/water walking

## 2022-02-28 NOTE — Assessment & Plan Note (Addendum)
She has been given a diagnosis of chronic diastolic heart failure without really true confirmation of this.  She has hypertension and diastolic dysfunction on echo with moderate LVH.  Continue BP control with carvedilol and ARB.  Would also recommend continuing if not daily, at least 3 to 4 days a week Lasix.  No spironolactone because of hyperkalemia.  Consider SGLT2 inhibitor for additional diuretic effect.  Support stockings if possible to lower EXTR edema.

## 2022-02-28 NOTE — Assessment & Plan Note (Signed)
BP is stable now on carvedilol and losartan along with furosemide.  She is really taking the Lasix as needed.  I would like for her to take at least a couple days a week to avoid aggression and hyperkalemia.

## 2022-03-24 IMAGING — NM NM PULMONARY PERF PARTICULATE
8 series · 8 of 8 positions shown · non-contrast
Comparison: Radiograph 07/31/2018

CLINICAL DATA: Chronic diastolic congestive heart failure

EXAM:
NUCLEAR MEDICINE PERFUSION LUNG SCAN
TECHNIQUE: Perfusion images were obtained in multiple projections after
intravenous injection of radiopharmaceutical.
Ventilation scans intentionally deferred if perfusion scan and chest
x-ray adequate for interpretation during COVID 19 epidemic.
RADIOPHARMACEUTICALS:  1.5 mCi 2c-MMm MAA

[Series 1: ant/post perf · 4.14mm/px · 1 of 1 slices shown (1 of 2)]
[im 1/1]
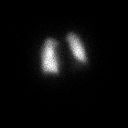

[Series 1: ant/post perf · 4.14mm/px · 1 of 1 slices shown (2 of 2)]
[im 1/1]
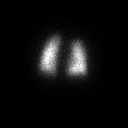

[Series 2: lao/rpo perf · 4.14mm/px · 1 of 1 slices shown (1 of 2)]
[im 1/1]
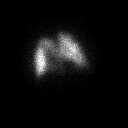

[Series 2: lao/rpo perf · 4.14mm/px · 1 of 1 slices shown (2 of 2)]
[im 1/1]
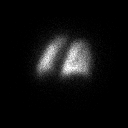

[Series 3: lpo/rao perf · 4.14mm/px · 1 of 1 slices shown (1 of 2)]
[im 1/1]
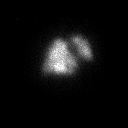

[Series 3: lpo/rao perf · 4.14mm/px · 1 of 1 slices shown (2 of 2)]
[im 1/1]
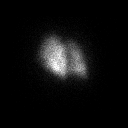

[Series 4: lt lat/rt lat perf · 4.14mm/px · 1 of 1 slices shown (1 of 2)]
[im 1/1]
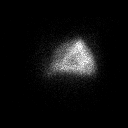

[Series 4: lt lat/rt lat perf · 4.14mm/px · 1 of 1 slices shown (2 of 2)]
[im 1/1]
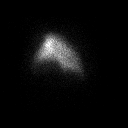

[8 of 8 positions shown; findings below may reference images not displayed]

FINDINGS: No wedge-shaped peripheral perfusion defects within the LEFT or
RIGHT lung to suggest acute pulmonary embolism.
IMPRESSION: Normal perfusion scan.  No evidence of acute pulmonary embolism.

## 2022-06-02 ENCOUNTER — Ambulatory Visit: Payer: Medicare HMO | Admitting: Dermatology

## 2022-06-10 ENCOUNTER — Emergency Department (HOSPITAL_COMMUNITY)
Admission: EM | Admit: 2022-06-10 | Discharge: 2022-06-11 | Disposition: A | Discharge status: Home / Self Care | Payer: Medicare HMO | Attending: Emergency Medicine | Admitting: Emergency Medicine

## 2022-06-10 ENCOUNTER — Encounter (HOSPITAL_COMMUNITY): Payer: Self-pay

## 2022-06-10 ENCOUNTER — Emergency Department (HOSPITAL_COMMUNITY): Payer: Medicare HMO

## 2022-06-10 DIAGNOSIS — Z91148 Patient's other noncompliance with medication regimen for other reason: Secondary | ICD-10-CM | POA: Insufficient documentation

## 2022-06-10 DIAGNOSIS — Z1152 Encounter for screening for COVID-19: Secondary | ICD-10-CM | POA: Insufficient documentation

## 2022-06-10 DIAGNOSIS — I110 Hypertensive heart disease with heart failure: Secondary | ICD-10-CM | POA: Insufficient documentation

## 2022-06-10 DIAGNOSIS — I509 Heart failure, unspecified: Secondary | ICD-10-CM | POA: Insufficient documentation

## 2022-06-10 DIAGNOSIS — I5033 Acute on chronic diastolic (congestive) heart failure: Secondary | ICD-10-CM | POA: Insufficient documentation

## 2022-06-10 DIAGNOSIS — Z794 Long term (current) use of insulin: Secondary | ICD-10-CM | POA: Diagnosis not present

## 2022-06-10 DIAGNOSIS — R739 Hyperglycemia, unspecified: Secondary | ICD-10-CM | POA: Insufficient documentation

## 2022-06-10 DIAGNOSIS — R0602 Shortness of breath: Secondary | ICD-10-CM | POA: Diagnosis present

## 2022-06-10 LAB — CBC WITH DIFFERENTIAL/PLATELET
Abs Immature Granulocytes: 0.02 10*3/uL (ref 0.00–0.07)
Basophils Absolute: 0 10*3/uL (ref 0.0–0.1)
Basophils Relative: 1 %
Eosinophils Absolute: 0.4 10*3/uL (ref 0.0–0.5)
Eosinophils Relative: 5 %
HCT: 38.7 % (ref 36.0–46.0)
Hemoglobin: 11.8 g/dL — ABNORMAL LOW (ref 12.0–15.0)
Immature Granulocytes: 0 %
Lymphocytes Relative: 24 %
Lymphs Abs: 1.7 10*3/uL (ref 0.7–4.0)
MCH: 29.2 pg (ref 26.0–34.0)
MCHC: 30.5 g/dL (ref 30.0–36.0)
MCV: 95.8 fL (ref 80.0–100.0)
Monocytes Absolute: 0.5 10*3/uL (ref 0.1–1.0)
Monocytes Relative: 7 %
Neutro Abs: 4.4 10*3/uL (ref 1.7–7.7)
Neutrophils Relative %: 63 %
Platelets: 174 10*3/uL (ref 150–400)
RBC: 4.04 MIL/uL (ref 3.87–5.11)
RDW: 13.2 % (ref 11.5–15.5)
WBC: 7 10*3/uL (ref 4.0–10.5)
nRBC: 0 % (ref 0.0–0.2)

## 2022-06-10 LAB — BRAIN NATRIURETIC PEPTIDE: B Natriuretic Peptide: 206.4 pg/mL — ABNORMAL HIGH (ref 0.0–100.0)

## 2022-06-10 LAB — BASIC METABOLIC PANEL
Anion gap: 10 (ref 5–15)
BUN: 29 mg/dL — ABNORMAL HIGH (ref 8–23)
CO2: 16 mmol/L — ABNORMAL LOW (ref 22–32)
Calcium: 8.4 mg/dL — ABNORMAL LOW (ref 8.9–10.3)
Chloride: 107 mmol/L (ref 98–111)
Creatinine, Ser: 1.42 mg/dL — ABNORMAL HIGH (ref 0.44–1.00)
GFR, Estimated: 38 mL/min — ABNORMAL LOW (ref >60)
Glucose, Bld: 310 mg/dL — ABNORMAL HIGH (ref 70–99)
Potassium: 4.8 mmol/L (ref 3.5–5.1)
Sodium: 133 mmol/L — ABNORMAL LOW (ref 135–145)

## 2022-06-10 NOTE — ED Provider Triage Note (Signed)
Emergency Medicine Provider Triage Evaluation Note  Kaitlin Berry , a 77 y.o. female  was evaluated in triage.  Pt complains of sob. Hx of CHF, endorse worsening sob on going x 2 days.  No fever, cough, cp, abd pain, or increase fluid retention.  No recent sickness  Review of Systems  Positive: As above Negative: As above  Physical Exam  BP (!) 187/59 (BP Location: Left Arm)   Pulse (!) 58   Temp 98 F (36.7 C) (Oral)   Resp (!) 22   SpO2 98%  Gen:   Awake, no distress   Resp:  Normal effort  MSK:   Moves extremities without difficulty  Other:    Medical Decision Making  Medically screening exam initiated at 8:13 PM.  Appropriate orders placed.  Colen Darling was informed that the remainder of the evaluation will be completed by another provider, this initial triage assessment does not replace that evaluation, and the importance of remaining in the ED until their evaluation is complete.     Fayrene Helper, PA-C 06/10/22 2014

## 2022-06-10 NOTE — ED Triage Notes (Signed)
Pt states that she has been having SOB for the past 2 days, pt reports hx of CHF, denies any extra swelling in legs

## 2022-06-11 ENCOUNTER — Other Ambulatory Visit (HOSPITAL_BASED_OUTPATIENT_CLINIC_OR_DEPARTMENT_OTHER): Payer: Self-pay

## 2022-06-11 ENCOUNTER — Encounter (HOSPITAL_BASED_OUTPATIENT_CLINIC_OR_DEPARTMENT_OTHER): Payer: Self-pay

## 2022-06-11 ENCOUNTER — Other Ambulatory Visit: Payer: Self-pay

## 2022-06-11 ENCOUNTER — Emergency Department (HOSPITAL_BASED_OUTPATIENT_CLINIC_OR_DEPARTMENT_OTHER): Payer: Medicare HMO

## 2022-06-11 ENCOUNTER — Emergency Department (HOSPITAL_BASED_OUTPATIENT_CLINIC_OR_DEPARTMENT_OTHER)
Admission: EM | Admit: 2022-06-11 | Discharge: 2022-06-11 | Disposition: A | Discharge status: Home / Self Care | Payer: Medicare HMO | Source: Home / Self Care | Attending: Emergency Medicine | Admitting: Emergency Medicine

## 2022-06-11 DIAGNOSIS — I10 Essential (primary) hypertension: Secondary | ICD-10-CM

## 2022-06-11 DIAGNOSIS — Z1152 Encounter for screening for COVID-19: Secondary | ICD-10-CM | POA: Insufficient documentation

## 2022-06-11 DIAGNOSIS — R739 Hyperglycemia, unspecified: Secondary | ICD-10-CM

## 2022-06-11 DIAGNOSIS — Z794 Long term (current) use of insulin: Secondary | ICD-10-CM | POA: Insufficient documentation

## 2022-06-11 DIAGNOSIS — Z91148 Patient's other noncompliance with medication regimen for other reason: Secondary | ICD-10-CM

## 2022-06-11 DIAGNOSIS — I110 Hypertensive heart disease with heart failure: Secondary | ICD-10-CM | POA: Insufficient documentation

## 2022-06-11 DIAGNOSIS — I5033 Acute on chronic diastolic (congestive) heart failure: Secondary | ICD-10-CM | POA: Insufficient documentation

## 2022-06-11 LAB — CBC
HCT: 39.9 % (ref 36.0–46.0)
Hemoglobin: 12.7 g/dL (ref 12.0–15.0)
MCH: 30 pg (ref 26.0–34.0)
MCHC: 31.8 g/dL (ref 30.0–36.0)
MCV: 94.3 fL (ref 80.0–100.0)
Platelets: 183 10*3/uL (ref 150–400)
RBC: 4.23 MIL/uL (ref 3.87–5.11)
RDW: 13.5 % (ref 11.5–15.5)
WBC: 9.6 10*3/uL (ref 4.0–10.5)
nRBC: 0 % (ref 0.0–0.2)

## 2022-06-11 LAB — COMPREHENSIVE METABOLIC PANEL
ALT: 31 U/L (ref 0–44)
AST: 34 U/L (ref 15–41)
Albumin: 3.7 g/dL (ref 3.5–5.0)
Alkaline Phosphatase: 149 U/L — ABNORMAL HIGH (ref 38–126)
Anion gap: 10 (ref 5–15)
BUN: 30 mg/dL — ABNORMAL HIGH (ref 8–23)
CO2: 17 mmol/L — ABNORMAL LOW (ref 22–32)
Calcium: 8.9 mg/dL (ref 8.9–10.3)
Chloride: 105 mmol/L (ref 98–111)
Creatinine, Ser: 1.47 mg/dL — ABNORMAL HIGH (ref 0.44–1.00)
GFR, Estimated: 37 mL/min — ABNORMAL LOW (ref >60)
Glucose, Bld: 380 mg/dL — ABNORMAL HIGH (ref 70–99)
Potassium: 4.6 mmol/L (ref 3.5–5.1)
Sodium: 132 mmol/L — ABNORMAL LOW (ref 135–145)
Total Bilirubin: 0.4 mg/dL (ref 0.3–1.2)
Total Protein: 8 g/dL (ref 6.5–8.1)

## 2022-06-11 LAB — URINALYSIS, ROUTINE W REFLEX MICROSCOPIC
Bilirubin Urine: NEGATIVE
Glucose, UA: 500 mg/dL — AB
Hgb urine dipstick: NEGATIVE
Ketones, ur: NEGATIVE mg/dL
Nitrite: NEGATIVE
Protein, ur: NEGATIVE mg/dL
Specific Gravity, Urine: 1.008 (ref 1.005–1.030)
pH: 5 (ref 5.0–8.0)

## 2022-06-11 LAB — BRAIN NATRIURETIC PEPTIDE: B Natriuretic Peptide: 151.3 pg/mL — ABNORMAL HIGH (ref 0.0–100.0)

## 2022-06-11 LAB — RESP PANEL BY RT-PCR (RSV, FLU A&B, COVID)  RVPGX2
Influenza A by PCR: NEGATIVE
Influenza B by PCR: NEGATIVE
Resp Syncytial Virus by PCR: NEGATIVE
SARS Coronavirus 2 by RT PCR: NEGATIVE

## 2022-06-11 LAB — TROPONIN I (HIGH SENSITIVITY): Troponin I (High Sensitivity): 4 ng/L (ref <18)

## 2022-06-11 MED ORDER — FUROSEMIDE 40 MG PO TABS
40.0000 mg | ORAL_TABLET | Freq: Once | ORAL | Status: AC
  Administered 2022-06-11: 40 mg via ORAL
  Filled 2022-06-11: qty 1

## 2022-06-11 MED ORDER — INSULIN ASPART 100 UNIT/ML IJ SOLN
12.0000 [IU] | Freq: Once | INTRAMUSCULAR | Status: AC
  Administered 2022-06-11: 12 [IU] via SUBCUTANEOUS

## 2022-06-11 NOTE — ED Provider Notes (Addendum)
[place] EMERGENCY DEPARTMENT AT [facility] Provider Note   CSN: [id number] Arrival date & time: 06/11/22  1401     History  Chief Complaint  Patient presents with   Shortness of Breath    Kaitlin Berry is a 77 y.o. female.  Pt with hx chf, c/o increased sob in the past 2-3 days. Symptoms moderate, persistent. Family member indicates pt not compliant w taking meds, esp lasix, in past month. Pt generally sleeps flat, uses cpap at night. No new leg pain or swelling. No cough or sore throat. No fever or chills. No chest pain or discomfort. Feels is making normal amount of urine. Denies abd pain or vomiting. Some nausea. No specific known ill contacts. Non smoker. No hx asthma or copd.   The history is provided by the patient, a relative and medical records.  Shortness of Breath Associated symptoms: no abdominal pain, no chest pain, no cough, no fever, no headaches, no neck pain, no rash, no sore throat and no vomiting        Home Medications Prior to Admission medications   Medication Sig Start Date End Date Taking? Authorizing Provider  blood glucose meter kit and supplies KIT Dispense based on patient and insurance preference. Use up to four times daily as directed. (FOR ICD-9 250.00, 250.01). 08/05/19   Marinda Elk, MD  carvedilol (COREG) 6.25 MG tablet Take 1 tablet (6.25 mg total) by mouth 2 (two) times daily. 09/23/21   Cannon Kettle, PA-C  citalopram (CELEXA) 40 MG tablet Take 40 mg by mouth daily.    [provider]  Dulaglutide (TRULICITY) 0.75 MG/0.5ML SOPN Inject 0.75 mg into the skin once a week. 08/27/19   [provider]  furosemide (LASIX) 40 MG tablet Take 1 tablet (40 mg total) by mouth daily. 09/14/21   Glade Lloyd, MD  HYDROcodone-acetaminophen (NORCO) 10-325 MG tablet Take 1 tablet by mouth daily as needed. 09/22/21   [provider]  insulin degludec (TRESIBA FLEXTOUCH) 100 UNIT/ML FlexTouch Pen Inject 35 Units  into the skin daily. 09/14/21   Glade Lloyd, MD  losartan (COZAAR) 50 MG tablet Take 50 mg by mouth daily. Take 1 Tablet Daily    [provider]  Risankizumab-rzaa (SKYRIZI PEN) 150 MG/ML SOAJ INJECT 150MG  (1 PEN) SUB Q EVERY 12 WEEKS 09/29/21   Janalyn Harder, MD  sodium bicarbonate 650 MG tablet Take 1 tablet (650 mg total) by mouth 3 (three) times daily. 09/14/21   Glade Lloyd, MD  triamcinolone cream (KENALOG) 0.1 % Apply 1 Application topically daily as needed (flare up). 09/14/21   Glade Lloyd, MD  vitamin B-12 (CYANOCOBALAMIN) 1000 MCG tablet Take 1,000 mcg by mouth daily.    [provider]      Allergies    Atorvastatin, Ciprofloxacin, Ciprofloxacin hcl, Spironolactone, and Lisinopril    Review of Systems   Review of Systems  Constitutional:  Negative for chills and fever.  HENT:  Negative for sore throat.   Eyes:  Negative for redness.  Respiratory:  Positive for shortness of breath. Negative for cough.   Cardiovascular:  Negative for chest pain, palpitations and leg swelling.  Gastrointestinal:  Positive for nausea. Negative for abdominal pain, diarrhea and vomiting.  Genitourinary:  Negative for dysuria and flank pain.  Musculoskeletal:  Negative for back pain and neck pain.  Skin:  Negative for rash.  Neurological:  Negative for headaches.  Psychiatric/Behavioral:  Negative for confusion.     Physical Exam Updated Vital Signs  BP (!) 185/64 (BP Location: Right Arm)   Pulse 76   Temp 98.6 F (37 C) (Oral)   Resp (!) 22   Ht 1.626 m ( )   Wt 129.3 kg   SpO2 96%   BMI 48.93 kg/m  Physical Exam Vitals and nursing note reviewed.  Constitutional:      Appearance: Normal appearance. She is well-developed.  HENT:     Head: Atraumatic.     Nose: Nose normal.     Mouth/Throat:     Mouth: Mucous membranes are moist.  Eyes:     General: No scleral icterus.    Conjunctiva/sclera: Conjunctivae normal.     Pupils: Pupils are equal, round, and  reactive to light.  Neck:     Trachea: No tracheal deviation.  Cardiovascular:     Rate and Rhythm: Normal rate and regular rhythm.     Pulses: Normal pulses.     Heart sounds: Normal heart sounds. No murmur heard.    No friction rub. No gallop.  Pulmonary:     Effort: Pulmonary effort is normal. No respiratory distress.     Breath sounds: Normal breath sounds.  Abdominal:     General: Bowel sounds are normal. There is no distension.     Palpations: Abdomen is soft.     Tenderness: There is no abdominal tenderness. There is no guarding.     Comments: Obese.   Genitourinary:    Comments: No cva tenderness.  Musculoskeletal:     Cervical back: Normal range of motion and neck supple. No rigidity. No muscular tenderness.     Comments: Mild symmetric bilateral ankle edema.   Skin:    General: Skin is warm and dry.     Findings: No rash.  Neurological:     Mental Status: She is alert.     Comments: Alert, speech normal.   Psychiatric:        Mood and Affect: Mood normal.     ED Results / Procedures / Treatments   Labs (all labs ordered are listed, but only abnormal results are displayed) Results for orders placed or performed during the hospital encounter of 06/11/22  Resp panel by RT-PCR (RSV, Flu A&B, Covid) Anterior Nasal Swab   Specimen: Anterior Nasal Swab  Result Value Ref Range   SARS Coronavirus 2 by RT PCR NEGATIVE NEGATIVE   Influenza A by PCR NEGATIVE NEGATIVE   Influenza B by PCR NEGATIVE NEGATIVE   Resp Syncytial Virus by PCR NEGATIVE NEGATIVE  CBC  Result Value Ref Range   WBC 9.6 4.0 - 10.5 K/uL   RBC 4.23 3.87 - 5.11 MIL/uL   Hemoglobin 12.7 12.0 - 15.0 g/dL   HCT 16.1 09.6 - 04.5 %   MCV 94.3 80.0 - 100.0 fL   MCH 30.0 26.0 - 34.0 pg   MCHC 31.8 30.0 - 36.0 g/dL   RDW 40.9 81.1 - 91.4 %   Platelets 183 150 - 400 K/uL   nRBC 0.0 0.0 - 0.2 %  Comprehensive metabolic panel  Result Value Ref Range   Sodium 132 (L) 135 - 145 mmol/L   Potassium 4.6 3.5 -  5.1 mmol/L   Chloride 105 98 - 111 mmol/L   CO2 17 (L) 22 - 32 mmol/L   Glucose, Bld 380 (H) 70 - 99 mg/dL   BUN 30 (H) 8 - 23 mg/dL   Creatinine, Ser 7.82 (H) 0.44 - 1.00 mg/dL   Calcium 8.9 8.9 - 95.6 mg/dL   Total  Protein 8.0 6.5 - 8.1 g/dL   Albumin 3.7 3.5 - 5.0 g/dL   AST 34 15 - 41 U/L   ALT 31 0 - 44 U/L   Alkaline Phosphatase 149 (H) 38 - 126 U/L   Total Bilirubin 0.4 0.3 - 1.2 mg/dL   GFR, Estimated 37 (L) >60 mL/min   Anion gap 10 5 - 15  Brain natriuretic peptide  Result Value Ref Range   B Natriuretic Peptide 151.3 (H) 0.0 - 100.0 pg/mL  Urinalysis, Routine w reflex microscopic -Urine, Catheterized  Result Value Ref Range   Color, Urine YELLOW YELLOW   APPearance CLEAR CLEAR   Specific Gravity, Urine 1.008 1.005 - 1.030   pH 5.0 5.0 - 8.0   Glucose, UA 500 (A) NEGATIVE mg/dL   Hgb urine dipstick NEGATIVE NEGATIVE   Bilirubin Urine NEGATIVE NEGATIVE   Ketones, ur NEGATIVE NEGATIVE mg/dL   Protein, ur NEGATIVE NEGATIVE mg/dL   Nitrite NEGATIVE NEGATIVE   Leukocytes,Ua SMALL (A) NEGATIVE   RBC / HPF 0-5 0 - 5 RBC/hpf   WBC, UA 0-5 0 - 5 WBC/hpf   Bacteria, UA FEW (A) NONE SEEN   Squamous Epithelial / HPF 0-5 0 - 5 /HPF   Mucus PRESENT    Hyaline Casts, UA PRESENT   Troponin I (High Sensitivity)  Result Value Ref Range   Troponin I (High Sensitivity) 4 <18 ng/L   DG Chest 2 View  Result Date: 06/10/2022 CLINICAL DATA:  Shortness of breath EXAM: CHEST - 2 VIEW COMPARISON:  09/09/2021 FINDINGS: No acute airspace disease. Mild cardiomegaly. No pleural effusion or pneumothorax. IMPRESSION: No active cardiopulmonary disease. Mild cardiomegaly. Electronically Signed   By: Jasmine Pang M.D.   On: 06/10/2022 21:16     EKG EKG Interpretation  Date/Time:  Friday June 11 2022 14:09:45 EDT Ventricular Rate:  78 PR Interval:  180 QRS Duration: 111 QT Interval:  403 QTC Calculation: 459 R Axis:   -64 Text Interpretation: Sinus rhythm Left anterior fascicular block  No significant change since last tracing Confirmed by Cathren Laine ([id number]) on 06/11/2022 2:19:53 PM  Radiology DG Chest 2 View  Result Date: 06/10/2022 CLINICAL DATA:  Shortness of breath EXAM: CHEST - 2 VIEW COMPARISON:  09/09/2021 FINDINGS: No acute airspace disease. Mild cardiomegaly. No pleural effusion or pneumothorax. IMPRESSION: No active cardiopulmonary disease. Mild cardiomegaly. Electronically Signed   By: Jasmine Pang M.D.   On: 06/10/2022 21:16    Procedures Procedures    Medications Ordered in ED Medications - No data to display  ED Course/ Medical Decision Making/ A&P                             Medical Decision Making Problems Addressed: Acute on chronic diastolic CHF (congestive heart failure): acute illness or injury    Details: Acute/chronic Essential hypertension: chronic illness or injury with exacerbation, progression, or side effects of treatment that poses a threat to life or bodily functions Hyperglycemia: acute illness or injury    Details: Acute/chronic Non compliance w medication regimen:    Details: Acute/chronic  Amount and/or Complexity of Data Reviewed Independent Historian:     Details: Family, hx External Data Reviewed: notes. Labs: ordered. Decision-making details documented in ED Course. Radiology: ordered and independent interpretation performed. Decision-making details documented in ED Course. ECG/medicine tests: ordered and independent interpretation performed. Decision-making details documented in ED Course.  Risk Prescription drug management. Decision regarding hospitalization.   Iv ns.  Continuous pulse ox and cardiac monitoring. Labs ordered/sent. Imaging ordered.   Differential diagnosis includes chf, pna, viral syndrome, etc. Dispo decision including potential need for admission considered - will get labs and imaging and reassess.   Reviewed nursing notes and prior charts for additional history. External reports reviewed.  Additional history from: family.   Cardiac monitor: sinus rhythm, rate 76.  Labs reviewed/interpreted by me - wbc normal. Hct normal. Trop normal. Bnp sl elev. Will give dose of her normal med.   Xrays reviewed/interpreted by me - no pna.   Chem c/w recent baseline. Glucose is high. Ag normal. Pt indicates has adequate insulin at home, has not had today. Novolog sq.   Pt is breathing comfortably, o2 sats  96%, no chest pain or discomfort, no worsening leg edema.   Pt currently appears stable for d/c.  Rec close cardiology/pcp f/u.  Return precautions provided.            Final Clinical Impression(s) / ED Diagnoses Final diagnoses:  None    Rx / DC Orders ED Discharge Orders     None            Cathren Laine, MD 06/11/22 [phone]

## 2022-06-11 NOTE — Discharge Instructions (Addendum)
It was our pleasure to provide your ER care today - we hope that you feel better.  Make sure to take your meds (including lasix and blood pressure meds) as prescribed by your doctor/cardiologist. Limit salt intake, follow heart healthy meal plan.   Your blood sugar is high.  Monitor blood sugars 4x/day and record  values, continue insulin/meds, drink adequate water, follow diabetes meal plan, and follow up closely with your doctor this coming week.   Follow up closely with  your doctor/cardiologist in the coming week regarding your diabetes, hypertension, and heart failure.  Call office to arrange close follow up appointment.   Return to ER if worse, new symptoms, fevers, chest pain, increased trouble breathing, or other concern.

## 2022-06-11 NOTE — ED Notes (Signed)
Pt notified that urine sample is needed 

## 2022-06-11 NOTE — ED Triage Notes (Addendum)
Patient here POV from Home.  Endorses SOB over 3 Days. No Fevers. No Cough. No Discernable Pain. Has not been taking Lasix for Months. Began again today.   Seen at another ED Last PM but LWBS after being assessed and screened.   SOB noted during triage. A&Ox4. GCS 15. BIB Personal WC.

## 2022-06-11 NOTE — ED Notes (Signed)
Lobby tech unable to locate pt in lobby or triage

## 2022-06-14 ENCOUNTER — Encounter (HOSPITAL_BASED_OUTPATIENT_CLINIC_OR_DEPARTMENT_OTHER): Payer: Self-pay | Admitting: Radiology

## 2022-06-14 ENCOUNTER — Telehealth: Payer: Self-pay | Admitting: Cardiology

## 2022-06-14 ENCOUNTER — Emergency Department (HOSPITAL_BASED_OUTPATIENT_CLINIC_OR_DEPARTMENT_OTHER): Payer: Medicare HMO | Admitting: Radiology

## 2022-06-14 ENCOUNTER — Emergency Department (HOSPITAL_BASED_OUTPATIENT_CLINIC_OR_DEPARTMENT_OTHER): Payer: Medicare HMO

## 2022-06-14 ENCOUNTER — Other Ambulatory Visit: Payer: Self-pay

## 2022-06-14 ENCOUNTER — Emergency Department (HOSPITAL_BASED_OUTPATIENT_CLINIC_OR_DEPARTMENT_OTHER)
Admission: EM | Admit: 2022-06-14 | Discharge: 2022-06-14 | Disposition: A | Discharge status: Home / Self Care | Payer: Medicare HMO | Attending: Emergency Medicine | Admitting: Emergency Medicine

## 2022-06-14 DIAGNOSIS — R0602 Shortness of breath: Secondary | ICD-10-CM | POA: Insufficient documentation

## 2022-06-14 DIAGNOSIS — Z794 Long term (current) use of insulin: Secondary | ICD-10-CM | POA: Diagnosis not present

## 2022-06-14 DIAGNOSIS — R0789 Other chest pain: Secondary | ICD-10-CM | POA: Insufficient documentation

## 2022-06-14 DIAGNOSIS — Z7984 Long term (current) use of oral hypoglycemic drugs: Secondary | ICD-10-CM | POA: Diagnosis not present

## 2022-06-14 DIAGNOSIS — E119 Type 2 diabetes mellitus without complications: Secondary | ICD-10-CM | POA: Diagnosis not present

## 2022-06-14 DIAGNOSIS — Z79899 Other long term (current) drug therapy: Secondary | ICD-10-CM | POA: Diagnosis not present

## 2022-06-14 DIAGNOSIS — I10 Essential (primary) hypertension: Secondary | ICD-10-CM | POA: Insufficient documentation

## 2022-06-14 LAB — CBC WITH DIFFERENTIAL/PLATELET
Abs Immature Granulocytes: 0.02 10*3/uL (ref 0.00–0.07)
Basophils Absolute: 0 10*3/uL (ref 0.0–0.1)
Basophils Relative: 1 %
Eosinophils Absolute: 0.4 10*3/uL (ref 0.0–0.5)
Eosinophils Relative: 5 %
HCT: 37.4 % (ref 36.0–46.0)
Hemoglobin: 12.1 g/dL (ref 12.0–15.0)
Immature Granulocytes: 0 %
Lymphocytes Relative: 25 %
Lymphs Abs: 2.1 10*3/uL (ref 0.7–4.0)
MCH: 29.7 pg (ref 26.0–34.0)
MCHC: 32.4 g/dL (ref 30.0–36.0)
MCV: 91.7 fL (ref 80.0–100.0)
Monocytes Absolute: 0.6 10*3/uL (ref 0.1–1.0)
Monocytes Relative: 7 %
Neutro Abs: 5.2 10*3/uL (ref 1.7–7.7)
Neutrophils Relative %: 62 %
Platelets: 187 10*3/uL (ref 150–400)
RBC: 4.08 MIL/uL (ref 3.87–5.11)
RDW: 13.4 % (ref 11.5–15.5)
WBC: 8.3 10*3/uL (ref 4.0–10.5)
nRBC: 0 % (ref 0.0–0.2)

## 2022-06-14 LAB — COMPREHENSIVE METABOLIC PANEL
ALT: 30 U/L (ref 0–44)
AST: 32 U/L (ref 15–41)
Albumin: 3.5 g/dL (ref 3.5–5.0)
Alkaline Phosphatase: 159 U/L — ABNORMAL HIGH (ref 38–126)
Anion gap: 11 (ref 5–15)
BUN: 38 mg/dL — ABNORMAL HIGH (ref 8–23)
CO2: 17 mmol/L — ABNORMAL LOW (ref 22–32)
Calcium: 8.7 mg/dL — ABNORMAL LOW (ref 8.9–10.3)
Chloride: 107 mmol/L (ref 98–111)
Creatinine, Ser: 1.64 mg/dL — ABNORMAL HIGH (ref 0.44–1.00)
GFR, Estimated: 32 mL/min — ABNORMAL LOW (ref >60)
Glucose, Bld: 246 mg/dL — ABNORMAL HIGH (ref 70–99)
Potassium: 4.5 mmol/L (ref 3.5–5.1)
Sodium: 135 mmol/L (ref 135–145)
Total Bilirubin: 0.4 mg/dL (ref 0.3–1.2)
Total Protein: 7.4 g/dL (ref 6.5–8.1)

## 2022-06-14 LAB — TROPONIN I (HIGH SENSITIVITY)
Troponin I (High Sensitivity): 4 ng/L (ref <18)
Troponin I (High Sensitivity): 3 ng/L (ref <18)

## 2022-06-14 LAB — BRAIN NATRIURETIC PEPTIDE: B Natriuretic Peptide: 88.6 pg/mL (ref 0.0–100.0)

## 2022-06-14 MED ORDER — IOHEXOL 350 MG/ML SOLN
100.0000 mL | Freq: Once | INTRAVENOUS | Status: AC | PRN
  Administered 2022-06-14: 100 mL via INTRAVENOUS

## 2022-06-14 NOTE — Discharge Instructions (Signed)
You are seen in the emergency department today for chest pressure and shortness of breath.  As we discussed your workup was reassuring today.  We looked for evidence of heart failure exacerbation, heart attack, blood clot in your lung, etc.  All this workup was negative.  I think that you would benefit from a stress test and an echocardiogram.  Please follow-up with your cardiologist later this week.  Continue to monitor how you're doing and return to the ER for new or worsening symptoms.

## 2022-06-14 NOTE — ED Provider Notes (Signed)
[place] EMERGENCY DEPARTMENT AT [facility] Provider Note   CSN: [id number] Arrival date & time: 06/14/22  1347     History  Chief Complaint  Patient presents with   Shortness of Breath    Kaitlin Berry is a 77 y.o. female with history of HLD, HTN, diabetes, anxiety, chronic back pain, GERD, OSA on CPAP who presents to the ER complaining of shortness of breath x 5 days. States she feels like she has heaviness on the left side of her chest, does not radiate anywhere. Symptoms worse with laying flat. No other aggravating or alleviating factors. Was seen for same the other day but states symptoms have persisted. No fevers, cough, leg swelling. Had some nausea that resolved.    Shortness of Breath Associated symptoms: chest pain        Home Medications Prior to Admission medications   Medication Sig Start Date End Date Taking? Authorizing Provider  blood glucose meter kit and supplies KIT Dispense based on patient and insurance preference. Use up to four times daily as directed. (FOR ICD-9 250.00, 250.01). 08/05/19   Marinda Elk, MD  carvedilol (COREG) 6.25 MG tablet Take 1 tablet (6.25 mg total) by mouth 2 (two) times daily. 09/23/21   Cannon Kettle, PA-C  citalopram (CELEXA) 40 MG tablet Take 40 mg by mouth daily.    [provider]  Dulaglutide (TRULICITY) 0.75 MG/0.5ML SOPN Inject 0.75 mg into the skin once a week. 08/27/19   [provider]  furosemide (LASIX) 40 MG tablet Take 1 tablet (40 mg total) by mouth daily. 09/14/21   Glade Lloyd, MD  HYDROcodone-acetaminophen (NORCO) 10-325 MG tablet Take 1 tablet by mouth daily as needed. 09/22/21   [provider]  insulin degludec (TRESIBA FLEXTOUCH) 100 UNIT/ML FlexTouch Pen Inject 35 Units into the skin daily. 09/14/21   Glade Lloyd, MD  losartan (COZAAR) 50 MG tablet Take 50 mg by mouth daily. Take 1 Tablet Daily    [provider]  Risankizumab-rzaa (SKYRIZI PEN) 150  MG/ML SOAJ INJECT  (1 PEN) SUB Q EVERY 12 WEEKS 09/29/21   Janalyn Harder, MD  sodium bicarbonate 650 MG tablet Take 1 tablet (650 mg total) by mouth 3 (three) times daily. 09/14/21   Glade Lloyd, MD  triamcinolone cream (KENALOG) 0.1 % Apply 1 Application topically daily as needed (flare up). 09/14/21   Glade Lloyd, MD  vitamin B-12 (CYANOCOBALAMIN) 1000 MCG tablet Take 1,000 mcg by mouth daily.    [provider]      Allergies    Atorvastatin, Ciprofloxacin, Ciprofloxacin hcl, Spironolactone, and Lisinopril    Review of Systems   Review of Systems  Respiratory:  Positive for shortness of breath.   Cardiovascular:  Positive for chest pain.  All other systems reviewed and are negative.   Physical Exam Updated Vital Signs BP (!) 119/48   Pulse (!) 59   Temp (!) 97.5 F (36.4 C) (Oral)   Resp 16   Ht  (1.626 m)   Wt 121.6 kg   SpO2 97%   BMI 46.00 kg/m  Physical Exam Vitals and nursing note reviewed.  Constitutional:      Appearance: Normal appearance.  HENT:     Head: Normocephalic and atraumatic.  Eyes:     Conjunctiva/sclera: Conjunctivae normal.  Cardiovascular:     Rate and Rhythm: Normal rate and regular rhythm.  Pulmonary:     Effort: Pulmonary effort is normal. No respiratory distress.     Breath  sounds: Normal breath sounds.  Abdominal:     General: There is no distension.     Palpations: Abdomen is soft.     Tenderness: There is no abdominal tenderness.  Musculoskeletal:     Right lower leg: No edema.     Left lower leg: No edema.  Skin:    General: Skin is warm and dry.  Neurological:     General: No focal deficit present.     Mental Status: She is alert.     ED Results / Procedures / Treatments   Labs (all labs ordered are listed, but only abnormal results are displayed) Labs Reviewed  COMPREHENSIVE METABOLIC PANEL - Abnormal; Notable for the following components:      Result Value   CO2 17 (*)    Glucose, Bld 246 (*)     BUN 38 (*)    Creatinine, Ser 1.64 (*)    Calcium 8.7 (*)    Alkaline Phosphatase 159 (*)    GFR, Estimated 32 (*)    All other components within normal limits  CBC WITH DIFFERENTIAL/PLATELET  BRAIN NATRIURETIC PEPTIDE  TROPONIN I (HIGH SENSITIVITY)  TROPONIN I (HIGH SENSITIVITY)    EKG EKG Interpretation  Date/Time:  Monday June 14 2022 14:00:10 EDT Ventricular Rate:  58 PR Interval:  175 QRS Duration: 109 QT Interval:  423 QTC Calculation: 416 R Axis:   -55 Text Interpretation: Sinus rhythm Left anterior fascicular block Abnormal R-wave progression, late transition New TW changes lead III Confirmed by Alvira Monday ([id number]) on 06/14/2022 2:53:29 PM  Radiology CT Angio Chest PE W and/or Wo Contrast  Result Date: 06/14/2022 CLINICAL DATA:  Pulmonary embolism (PE) suspected, high prob Difficulty breathing for 5 days, chest heaviness. EXAM: CT ANGIOGRAPHY CHEST WITH CONTRAST TECHNIQUE: Multidetector CT imaging of the chest was performed using the standard protocol during bolus administration of intravenous contrast. Multiplanar CT image reconstructions and MIPs were obtained to evaluate the vascular anatomy. RADIATION DOSE REDUCTION: This exam was performed according to the departmental dose-optimization program which includes automated exposure control, adjustment of the mA and/or kV according to patient size and/or use of iterative reconstruction technique. CONTRAST:  OMNIPAQUE IOHEXOL 350 MG/ML SOLN COMPARISON:  Chest radiograph earlier today. Included portion from cardiac CT 06/12/2021, chest CTA 11/16/2016 FINDINGS: Cardiovascular: There are no filling defects within the pulmonary arteries to suggest pulmonary embolus. Evaluation is diagnostic to the segmental level. The subsegmental branches are not well assessed. Mild aortic atherosclerosis. Aortic tortuosity without aneurysm. The heart is normal in size. No pericardial effusion. Mediastinum/Nodes: No mediastinal or hilar  adenopathy. Decompressed esophagus. Lungs/Pleura: Mild bronchial thickening. Minor subsegmental right middle lobe atelectasis. No confluent airspace disease. No pleural effusion. No pulmonary mass or visible nodule. Upper Abdomen: The spleen is upper normal in size spanning 13.1 cm cranial caudal. Capsular nodularity of the liver is suspicious for cirrhosis. There is a simple cyst in the right kidney that needs no further imaging follow-up. Musculoskeletal: Diffuse flowing syndesmophytes and anterior osteophytes throughout the thoracic spine. Exaggerated thoracic kyphosis. No acute osseous findings. Review of the MIP images confirms the above findings. IMPRESSION: 1. No pulmonary embolus. 2. Mild bronchial thickening. 3. 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 Borderline splenomegaly. Aortic Atherosclerosis (ICD10-I70.0). Electronically Signed   By: Narda Rutherford M.D.   On: 06/14/2022 16:24   DG Chest Port 1 View  Result Date: 06/14/2022 CLINICAL DATA:  Shortness of breath. EXAM: PORTABLE CHEST 1 VIEW COMPARISON:  06/11/2022. FINDINGS: Clear lungs. Stable cardiac and mediastinal contours. No pleural  effusion pneumothorax. Visualized bones and upper abdomen unremarkable. IMPRESSION: No evidence of acute cardiopulmonary disease. Electronically Signed   By: Orvan Falconer M.D.   On: 06/14/2022 14:40    Procedures Procedures    Medications Ordered in ED Medications  iohexol (OMNIPAQUE) 350 MG/ML injection 100 mL (100 mLs Intravenous Contrast Given 06/14/22 1558)    ED Course/ Medical Decision Making/ A&P             HEART Score: 4                Medical Decision Making Amount and/or Complexity of Data Reviewed Labs: ordered. Radiology: ordered.   This patient is a 77 y.o. female  who presents to the ED for concern of shortness of breath.   Differential diagnoses prior to evaluation: The emergent differential diagnosis includes, but is not limited to,  CHF, pericardial effusion/tamponade,  arrhythmias, ACS, COPD, asthma, bronchitis, pneumonia, pneumothorax, PE, anemia . This is not an exhaustive differential.   Past Medical History / Co-morbidities: HLD, HTN, diabetes, anxiety, chronic back pain, GERD, OSA on CPAP  Additional history: Chart reviewed. Pertinent results include: ER note from 4/12 reviewed. Labs and imaging overall unremarkable at that time, with BNP elevated to 151.3. Was discharged with cardiology f/u. Telephone communication from cardiologist today where they recommended evaluation in ER.   Physical Exam: Physical exam performed. The pertinent findings include: Hypertensive, otherwise normal vital signs.  Maintaining normal oxygen saturations on room air.  Normal respiratory effort, lung sounds clear.  Abdomen soft and nontender.  Lab Tests/Imaging studies: I personally interpreted labs/imaging and the pertinent results include: CBC unremarkable.  CMP grossly at baseline with creatinine and GFR similar compared to prior.  Initial troponin 4, delta troponin 3.  BNP 88.6.   CXR without acute abnormalities. CT PE was no evidence of blood clot. I agree with the radiologist interpretation.  Cardiac monitoring: EKG obtained and interpreted by my attending physician which shows: [place] rhythm with nonspecific T wave change in lead III   Disposition: After consideration of the diagnostic results and the patients response to treatment, I feel that emergency department workup does not suggest an emergent condition requiring admission or immediate intervention beyond what has been performed at this time. The plan is: Discharged home with reassurance.  Encouraged follow-up with cardiology later this week.  I think patient would benefit from an outpatient stress test and echocardiogram.  No emergent etiology found for symptoms today. Heart score of 4. The patient is safe for discharge and has been instructed to return immediately for worsening symptoms, change in symptoms or any  other concerns.  Final Clinical Impression(s) / ED Diagnoses Final diagnoses:  Shortness of breath  Chest heaviness    Rx / DC Orders ED Discharge Orders     None      Portions of this report may have been transcribed using voice recognition software. Every effort was made to ensure accuracy; however, inadvertent computerized transcription errors may be present.    Jeanella Flattery 06/14/22 1743    Alvira Monday, MD 06/14/22 2228

## 2022-06-14 NOTE — ED Triage Notes (Signed)
States having problems with breathing for about 5 days.  States having a heaviness in the mid chest.  Present labour breathing

## 2022-06-14 NOTE — Telephone Encounter (Signed)
Pt c/o Shortness Of Breath: STAT if SOB developed within the last 24 hours or pt is noticeably SOB on the phone  1. Are you currently SOB (can you hear that pt is SOB on the phone)? Yes  2. How long have you been experiencing SOB? 3 days   3. Are you SOB when sitting or when up moving around? both  4. Are you currently experiencing any other symptoms? Nauseous

## 2022-06-14 NOTE — Telephone Encounter (Signed)
Spoke with patient and she was seen in ED on 4/12 for shortness of breath. Patient states she was told to follow up with cardiologist sooner than June. She has appointment scheduled for Friday with Gavin Pound.  Patient states she she is still having shortness of breath and feels like her chest is really heavy. Her symptoms are not improving but getting worse. Advised patient to call 911 and go to ED. Husband is at home with her.

## 2022-06-17 NOTE — Progress Notes (Signed)
Cardiology Clinic Note   Date: 06/18/2022 ID: Kaitlin Berry, DOB [birth year], MRN [id number]  Primary Cardiologist:  Kaitlin Lemma, MD  Patient Profile    Kaitlin Berry is a 77 y.o. female who presents to the clinic today for hospital follow-up.  Past medical history significant for: Nonobstructive CAD. Coronary CTA 06/12/2021: Coronary calcium score 100 (68th percentile).  Minimal nonobstructive CAD in left main and RCA. Chronic diastolic heart failure. Echo 06/16/2021: EF 70 to 75%.  Moderate LVH.  Grade I DD.  Mild LAE.  Trivial MR. Hypertension. Hyperlipidemia. Lipid panel 08/13/2021: LDL 69, HDL 33, TG 130, total 125. Insulin-dependent T2DM. Hypothyroidism. OSA. CKD stage IV.   History of Present Illness    Kaitlin Berry was first evaluated by Kaitlin Berry on 11/16/2016 for shortness of breath and lower extremity edema at the request of Kaitlin Berry.  Echo showed normal LV function, mild LVH, Grade I DD.  Nuclear stress test was a normal low risk study.  Patient transferred her care to Kaitlin Berry on 05/29/2021.  She is followed for the above outlined history.  Patient was last seen in the office by Kaitlin Berry on 02/08/2022.  She was doing well and no medication changes were made.  More recently, patient had 3 ED visits in April.  To the ED on 06/10/2022 with complaints of a 2-day history of shortness of breath labs were drawn which showed an elevated BNP.  Patient left without being seen.  She presented to a different ED on 06/11/2022 with same complaints.  At that time she reported she had not been taking Lasix for months but restarted on that day.  BNP was still elevated but lower than the previous day.  Noted negative.  Negative for COVID flu and RSV.  Discharged home and instructed to follow-up with cardiology. She contacted the office on 06/14/2022 with complaints of shortness of breath and chest heaviness.  She was instructed to call 911 and present to the ED. BNP was normal  and troponin negative x 2.  CTA chest (PE protocol) showed no PE.  She was discharged home.  Today, patient is accompanied by her daughter. She reports she was seen by PCP on 06/15/2022 and started on omeprazole and has since felt better with no further episodes of shortness of breath. Daughter feels shortness of breath episodes may be related to blood sugar. She states the patient will wait to long to eat then state she needs to eat, blood sugar will be in the 200s, she will then put her head in hands and start to "pant" and clutch her chest stating that she cannot breathe. Then episode will pass. Patient states she feels tightness in her chest. She denies feeling nervous or anxious prior to episode. However, with further questioning patient admits many years ago she would experience the same chest tightness and realize later that she was nervous or anxious about something but "I never had the breathing thing with it.'  Patient ambulates at home with a walker and denies DOE although patient's daughter feels she gets winded when she is up walking. Patient states she thinks it is normal for her and states "I don't feel like it's anything." No lower extremity edema, orthopnea (on CPAP 100% adherence) or PND. Patient is followed by endocrinology for diabetes and nephrology for CKD.     ROS: All other systems reviewed and are otherwise negative except as noted in History of Present Illness.  Studies Reviewed    ECG  Is not ordered today.          Physical Exam    VS:  BP 128/78   Pulse 65   Ht  (1.626 m)   Wt 268 lb (121.6 kg) Comment: pt. is in a wheelchair  SpO2 98%   BMI 46.00 kg/m  , BMI Body mass index is 46 kg/m.  GEN: Well nourished, well developed, in no acute distress. Neck: No JVD or carotid bruits. Cardiac: RRR. No murmurs. No rubs or gallops.   Respiratory:  Respirations regular and unlabored. Clear to auscultation without rales, wheezing or rhonchi. GI: Soft, nontender,  nondistended. Extremities: Radials/DP/PT 2+ and equal bilaterally. No clubbing or cyanosis. No edema.  Skin: Warm and dry, no rash. Psychiatric: Anxious affect.  Neuro: Strength intact.  Assessment & Plan   Shortness of breath/chronic diastolic heart failure.  Echo April 2023 showed EF 70 to 75%, moderate LVH, Grade I DD.  Patient reports several episodes of shortness of breath for which she was evaluated in the ED. No further episodes since being started on omeprazole on 06/15/2022. Daughter is concerned there is an anxiety component to symptoms. Patient admits years ago she would have the same chest tightness and realize later she was nervous or anxious. Daughter reports DOE with household activities but patient does not feel she is dyspneic with exertion but may get "winded" at times but this is unchanged for years. I feel this may be secondary to deconditioning. She is anxious appearing on exam today. Discussed trying calming measures when she starts to feel she is short of breath to see if that helps. Euvolemic and well compensated on exam. Reviewed extensive workup during ED visits. I do not think any further testing is warranted at this time. Continue carvedilol, losartan. She is not taking Lasix and I do not think it is needed at this point.  Nonobstructive CAD.  Coronary CTA April 2023 showed coronary calcium score 100 with minimal nonobstructive CAD left main and RCA.  Patient reports chest tightness in relation to shortness of breath episodes. No exertional pain. Troponin negative during recent ED visits. Ischemic workup not indicated at this time.  Continue carvedilol. Hypertension.  BP today 128/78. Patient denies headaches or dizziness.  Continue carvedilol, losartan. Hyperlipidemia.  LDL June 2023 69, at goal.  Patient is intolerant to statins.  Disposition: Keep follow-up with Kaitlin Berry as previously scheduled or sooner as needed.          Signed, Kaitlin Berry. Kaitlin Newmark, DNP,  NP-C

## 2022-06-18 ENCOUNTER — Ambulatory Visit: Payer: Medicare HMO | Attending: Student | Admitting: Student

## 2022-06-18 ENCOUNTER — Encounter: Payer: Self-pay | Admitting: Student

## 2022-06-18 VITALS — BP 128/78 | HR 65 | Ht 64.0 in | Wt 268.0 lb

## 2022-06-18 DIAGNOSIS — I5032 Chronic diastolic (congestive) heart failure: Secondary | ICD-10-CM

## 2022-06-18 DIAGNOSIS — E785 Hyperlipidemia, unspecified: Secondary | ICD-10-CM

## 2022-06-18 DIAGNOSIS — I10 Essential (primary) hypertension: Secondary | ICD-10-CM

## 2022-06-18 DIAGNOSIS — I2510 Atherosclerotic heart disease of native coronary artery without angina pectoris: Secondary | ICD-10-CM | POA: Diagnosis not present

## 2022-06-18 DIAGNOSIS — R0602 Shortness of breath: Secondary | ICD-10-CM | POA: Diagnosis not present

## 2022-06-18 DIAGNOSIS — E1169 Type 2 diabetes mellitus with other specified complication: Secondary | ICD-10-CM

## 2022-06-18 NOTE — Patient Instructions (Signed)
Medication Instructions:  Your physician recommends that you continue on your current medications as directed. Please refer to the Current Medication list given to you today.  *If you need a refill on your cardiac medications before your next appointment, please call your pharmacy*   Lab Work: NONE If you have labs (blood work) drawn today and your tests are completely normal, you will receive your results only by: MyChart Message (if you have MyChart) OR A paper copy in the mail If you have any lab test that is abnormal or we need to change your treatment, we will call you to review the results.   Testing/Procedures: NONE   Follow-Up: At [facility], you and your health needs are our priority.  As part of our continuing mission to provide you with exceptional heart care, we have created designated Provider Care Teams.  These Care Teams include your primary Cardiologist (physician) and Advanced Practice Providers (APPs -  Physician Assistants and Nurse Practitioners) who all work together to provide you with the care you need, when you need it.  We recommend signing up for the patient portal called "MyChart".  Sign up information is provided on this After Visit Summary.  MyChart is used to connect with patients for Virtual Visits (Telemedicine).  Patients are able to view lab/test results, encounter notes, upcoming appointments, etc.  Non-urgent messages can be sent to your provider as well.   To learn more about what you can do with MyChart, go to [email or web address].    Your next appointment:   Please keep upcoming appointment.  

## 2022-08-24 ENCOUNTER — Encounter: Payer: Self-pay | Admitting: Cardiology

## 2022-08-24 ENCOUNTER — Ambulatory Visit: Payer: Medicare HMO | Attending: Cardiology | Admitting: Cardiology

## 2022-08-24 VITALS — BP 152/81 | HR 72 | Ht 63.0 in | Wt 286.8 lb

## 2022-08-24 DIAGNOSIS — E785 Hyperlipidemia, unspecified: Secondary | ICD-10-CM

## 2022-08-24 DIAGNOSIS — E1169 Type 2 diabetes mellitus with other specified complication: Secondary | ICD-10-CM

## 2022-08-24 DIAGNOSIS — R072 Precordial pain: Secondary | ICD-10-CM | POA: Diagnosis not present

## 2022-08-24 DIAGNOSIS — I5032 Chronic diastolic (congestive) heart failure: Secondary | ICD-10-CM

## 2022-08-24 DIAGNOSIS — I10 Essential (primary) hypertension: Secondary | ICD-10-CM

## 2022-08-24 NOTE — Progress Notes (Signed)
Primary Care Provider: Barbie Banner, MD [facility] Cardiologist: Bryan Lemma, MD Electrophysiologist: None  Clinic Note: Chief Complaint  Patient presents with   Follow-up    2 months.  Doing better.  PPI helping    ===================================  ASSESSMENT/PLAN   Problem List Items Addressed This Visit       Cardiology Problems   Hyperlipidemia associated with type 2 diabetes mellitus (HCC) (Chronic)    LDL 69 last years pretty much at goal.  She should be due for labs to be checked soon.  Currently not on medications for lipids.  Will monitor closely.  She is Trulicity for diabetes, wonder if she would benefit from Ozempic/Mounjaro or even potentially Wegovy/Zepbound. Also stop by potentially using SGLT2 inhibitor      Essential hypertension - Primary (Chronic)    BP is high today on carvedilol 6.25 mg twice daily.  I found that she was not taking her losartan.  Recommended she restart losartan.  Most recent potassium was 4.5 so she should be fine.  Also taking her diuretic daily.  Additional doses as needed.      Chronic diastolic heart failure (HCC) (Chronic)    I am not sure if this is a real true diagnosis.  She has edema but no real PND orthopnea.  Echo does show some hypertensive heart disease with moderate LVH, but she really does not have PND orthopnea.  Of course being on CPAP could mask this.  She does have edema but not significant.   She takes daily Lasix but has not taken any additional doses.  I recommend that she take extra doses for weight gain more than 3 pounds or worsening edema. Recommend support stockings-knee-high and foot elevation as well as foot exercises.        Other   Precordial pain    Some costosternal discomfort.  But nothing significant.  Has had pretty nonischemic evaluation with no obvious culprit.  Perhaps musculoskeletal pain.  Current symptoms seem to be well relieved with PPI suggesting that is probably  GERD.      ===================================  HPI:    @HCNARRATIVEHISTORYBEGIN @ Kaitlin Berry is a super morbidly obese 77 y.o. female with a PMH below who presents today for 6-month follow-up at the request of Barbie Banner, MD. I initially saw her in December 2023, transferring care from Dr. Mayford Knife to join her husband as a patient of mine  Notable PMH: Nonobstructive CAD. Coronary CTA 06/12/2021: Coronary calcium score 100 (68th percentile).  Minimal nonobstructive CAD in left main and RCA. Chronic Diastolic Heart Failure.-NYHA class II because of deconditioning/obesity Echo 06/16/2021: EF 70 to 75%.  Moderate LVH.  Grade I DD.  Mild LAE.  Trivial MR. Hypertension. Hyperlipidemia. Lipid panel 08/13/2021: LDL 69, HDL 33, TG 130, total 125. Insulin-dependent T2DM.-Followed by endocrinology Hypothyroidism. OSA. CKD Stage IV -> followed by nephrology.  (Last creatinine was 1.64.)   Recent Hospitalizations:  3 ER visits in April-complaints of dyspnea and chest heaviness with elevated BNP.  PE protocol CT negative.  Started on omeprazole by PCP.  Feeling better.  Noted that the dyspnea and BM have been more related to hide glycemia.  Describes sensation of tightness in the chest.  Likely more related to anxiety.  100% CPAP edema.  Did not feel that ischemic evaluation was warranted.  Kaitlin Berry was last seen on 06/18/2022 by Carlos Levering, NP, NP in response to a telephone call on 15 April with complaints of shortness of breath  and chest heaviness.  She went to the ER, troponin negative x 2 and BNP was normal.  PCP started her on omeprazole on the 16th.  Daughter Kaitlin Berry think that her dyspnea was related to high per glycemia.  Patient denied any anxiety or nervousness.  But she does indicate that she will get chest tightness during anxiety.  Has never had any breathing issues with it.  100% CPAP adherence. => Symptoms felt to be related to deconditioning and obesity.  Was  euvolemic on exam, send.  Extensive cardiac evaluation reviewed along with the ER visit evaluations.  @HCNARRATIVEHISTORYEND @  Reviewed  CV studies:    The following studies were reviewed today: (if available, images/films reviewed: From Epic Chart or Care Everywhere) No new studies:  Interval History:   Kaitlin Berry returns here today along with her husband Kaitlin Berry) stating that she is actually doing better.  Since that she started a PPI for GERD, the chest discomfort and breathing episodes have actually improved a lot.  She is not really having any more chest pain or pressure at this point in time now.  She still wears CPAP but otherwise sleeps on 2 pillows.  She is in a wheelchair today but does walk around the house but does not FFR mostly because of bad knee.  As a result she is quite sedentary and has exertional dyspnea if she does not too much.  She also has pedal edema.  At baseline she denies any dizziness or lightheadedness..  No near syncope.  No stroke or TIA symptoms.  No rapid irregular beats palpitations.  No syncope or near syncope. Still uses CPAP.  She has not been taking her losartan very routinely for reason pressure.  She also does not necessarily take her furosemide additional doses.  REVIEWED OF SYSTEMS   Review of Systems  Constitutional:  Positive for malaise/fatigue (Very deconditioned.). Negative for weight loss (Weight gain noted.).  HENT:  Negative for nosebleeds.   Respiratory:  Positive for shortness of breath (Baseline).   Cardiovascular:  Positive for leg swelling.  Gastrointestinal:  Negative for abdominal pain, blood in stool, diarrhea and melena.  Genitourinary:  Positive for frequency. Negative for hematuria.  Musculoskeletal:  Positive for back pain and joint pain (Knee).  Neurological:  Positive for focal weakness (Both legs are weak). Negative for dizziness.  Endo/Heme/Allergies:  Does not bruise/bleed easily.  Psychiatric/Behavioral:   Negative for depression and memory loss. The patient is nervous/anxious. The patient does not have insomnia.   All other systems reviewed and are negative.   I have reviewed and (if needed) personally updated the patient's problem list, medications, allergies, past medical and surgical history, social and family history.   PAST MEDICAL HISTORY   Past Medical History:  Diagnosis Date   Anxiety    Arthritis    rt knee   BACK PAIN 03/24/2007   Qualifier: Diagnosis of  By: Shelle Iron MD, Maree Krabbe    Chronic back pain    DEPRESSION 03/24/2007   Qualifier: Diagnosis of  By: Shelle Iron MD, Maree Krabbe    Diabetes mellitus without complication, with long-term current use of insulin (HCC)    GERD (gastroesophageal reflux disease)    Hyperlipidemia    HYPERSOMNIA 03/24/2007   Qualifier: Diagnosis of  By: Shelle Iron MD, Maree Krabbe    Hypertension    Morbid obesity with BMI of 45.0-49.9, adult (HCC)    OBSTRUCTIVE SLEEP APNEA 05/17/2007   severe with AHI 35.1 on PSG 2009 - on CPAP  Psoriasis    Urticaria 11/27/2011    PAST SURGICAL HISTORY   Past Surgical History:  Procedure Laterality Date   ABDOMINAL HYSTERECTOMY     BACK SURGERY     lumbar   BREAST LUMPECTOMY WITH RADIOACTIVE SEED LOCALIZATION Right 10/01/2014   Procedure: RIGHT BREAST LUMPECTOMY WITH RADIOACTIVE SEED LOCALIZATION;  Surgeon: Manus Rudd, MD;  Location: [facility];  Service: General;  Laterality: Right;   CHOLECYSTECTOMY     CT CTA CORONARY W/CA SCORE W/CM &/OR WO/CM  06/12/2021   Coronary Calcium Score 100.  Minimal nonobstructive CAD (1 to 24%).  Consider nonatherosclerotic cause of chest pain or dyspnea.  Normal pulmonary vein drainage.  Normal left atrial appendage with no thrombus.  No significant noncardiac findings.   INCISIONAL BREAST BIOPSY     multiple   NM MYOVIEW LTD  11/2016   Normal LVEF - NO ISCHEMIA OR INFARCTION -- LOW RISK / NORMAL   SPINE SURGERY     TRANSTHORACIC ECHOCARDIOGRAM  11/29/2016    a) 10/'18: EF 60-65%.  Nl LV size w/ mild LVH.  No RWMA.  GR 1 DD & Nl LA size.  Mild MAC.  Trivial MR.  Mildly calcified ApV (sclerosis w/o stenosis).  Nl RV size & fxn.  Nl RVP & RAP.;; b) 08/01/2019: EF 60-65%.  No RWMA.  Nl LV size & fxn.  GR 1 DD.  Nl LA size.  No MR.  Mild AoV sclerosis w/o stenosis.  Nl RV size & fxn.  Nl RAP.  Unable to assess PA.-Poor windows.   TRANSTHORACIC ECHOCARDIOGRAM  06/16/2021   EF 70 to 75%.  Hyperdynamic LV with no RWMA.  Moderate LVH.  GR 1 DD.  Mild LA dilation.  Normal valves.    MEDICATIONS/ALLERGIES   Current Meds  Medication Sig   blood glucose meter kit and supplies KIT Dispense based on patient and insurance preference. Use up to four times daily as directed. (FOR ICD-9 250.00, 250.01).   carvedilol (COREG) 6.25 MG tablet Take 1 tablet (6.25 mg total) by mouth 2 (two) times daily.   citalopram (CELEXA) 40 MG tablet Take 40 mg by mouth daily.   Dulaglutide (TRULICITY) 0.75 MG/0.5ML SOPN Inject 0.75 mg into the skin once a week.   HYDROcodone-acetaminophen (NORCO) 10-325 MG tablet Take 1 tablet by mouth daily as needed.   insulin degludec (TRESIBA FLEXTOUCH) 100 UNIT/ML FlexTouch Pen Inject 35 Units into the skin daily.   losartan (COZAAR) 50 MG tablet Take 50 mg by mouth daily. Take 1 Tablet Daily   sodium bicarbonate 650 MG tablet Take 1 tablet (650 mg total) by mouth 3 (three) times daily.   triamcinolone cream (KENALOG) 0.1 % Apply 1 Application topically daily as needed (flare up).   vitamin B-12 (CYANOCOBALAMIN) 1000 MCG tablet Take 1,000 mcg by mouth daily.    Allergies  Allergen Reactions   Atorvastatin Other (See Comments)   Ciprofloxacin Other (See Comments) and Hives    Unknown   Ciprofloxacin Hcl Hives   Spironolactone     hyperkalemia   Lisinopril Rash    SOCIAL HISTORY/FAMILY HISTORY   Reviewed in Epic:  Pertinent findings: Non-smoker.  No alcohol.   Social History   Social History Narrative   Not on file    OBJCTIVE  -PE, EKG, labs   Wt Readings from Last 3 Encounters:  08/24/22 286 lb 12.8 oz (130.1 kg)  06/18/22 268 lb (121.6 kg)  06/14/22 268 lb (121.6 kg)    Physical Exam: BP Marland Kitchen)  152/81 (BP Location: Left Arm, Patient Position: Sitting, Cuff Size: Large)   Pulse 72   Ht 5\' 3"  (1.6 m)   Wt 286 lb 12.8 oz (130.1 kg)   SpO2 96%   BMI 50.80 kg/m  Physical Exam Vitals reviewed.  Constitutional:      General: She is not in acute distress.    Appearance: She is obese. She is ill-appearing (chronically ill appearing; in a wheelchair; Morbidly obese). She is not toxic-appearing.  HENT:     Head: Normocephalic and atraumatic.  Neck:     Vascular: Normal carotid pulses. No carotid bruit or JVD.  Cardiovascular:     Rate and Rhythm: Normal rate and regular rhythm. No extrasystoles are present.    Chest Wall: PMI is not displaced (Unable to palpate).     Pulses: Decreased pulses (Difficult to palpate due to body habitus and edema.).     Heart sounds: S1 normal and S2 normal. Heart sounds are distant. No murmur heard.    No friction rub. No gallop.  Pulmonary:     Effort: Pulmonary effort is normal. No respiratory distress.     Breath sounds: Normal breath sounds. No wheezing, rhonchi or rales.  Chest:     Chest wall: Tenderness present.  Musculoskeletal:        General: Swelling (At least 1-2+ BLE) present.     Cervical back: Normal range of motion and neck supple.  Skin:    General: Skin is warm and dry.  Neurological:     Mental Status: She is alert.  Psychiatric:        Mood and Affect: Mood normal.        Behavior: Behavior normal.        Thought Content: Thought content normal.        Judgment: Judgment normal.     Comments: CT send somewhat subdued.  Not really depressed.  Does not talk much     Adult ECG Report Not checked  Recent Labs: Reviewed.  Due for lipids to be checked this year. Lab Results  Component Value Date   CHOL 125 08/13/2021   HDL 33 (L) 08/13/2021    LDLCALC 69 08/13/2021   TRIG 130 08/13/2021   CHOLHDL 3.8 08/13/2021   Lab Results  Component Value Date   CREATININE 1.64 (H) 06/14/2022   BUN 38 (H) 06/14/2022   NA 135 06/14/2022   K 4.5 06/14/2022   CL 107 06/14/2022   CO2 17 (L) 06/14/2022      Latest Ref Rng & Units 06/14/2022    2:13 PM 06/11/2022    2:16 PM 06/10/2022    8:29 PM  CBC  WBC 4.0 - 10.5 K/uL 8.3  9.6  7.0   Hemoglobin 12.0 - 15.0 g/dL 16.1  09.6  04.5   Hematocrit 36.0 - 46.0 % 37.4  39.9  38.7   Platelets 150 - 400 K/uL 187  183  174     Lab Results  Component Value Date   HGBA1C 7.8 (H) 08/13/2021   No results found for: "TSH"  ================================================== I spent a total of 19 minutes with the patient spent in direct patient consultation.  Additional time spent with chart review  / charting (studies, outside notes, etc): 4 team min Total Time: 33 min  Current medicines are reviewed at length with the patient today.  (+/- concerns) none  Notice: This dictation was prepared with Dragon dictation along with smart phrase technology. Any transcriptional errors that result from  this process are unintentional and may not be corrected upon review.  Studies Ordered:   No orders of the defined types were placed in this encounter.  No orders of the defined types were placed in this encounter.   Patient Instructions / Medication Changes & Studies & Tests Ordered   Patient Instructions  Medication Instructions:  Take your  Losartan    Take  Lasix ( furosemide)  at least twice a week   *If you need a refill on your cardiac medications before your next appointment, please call your pharmacy*  Other Instructions   Elevate your feet  at 3 to 4 times a day  for 20 min at the time   Lab Work:  Not needed   Testing/Procedures: Not needed   Follow-Up: At [facility], you and your health needs are our priority.  As part of our continuing mission to provide you with  exceptional heart care, we have created designated Provider Care Teams.  These Care Teams include your primary Cardiologist (physician) and Advanced Practice Providers (APPs -  Physician Assistants and Nurse Practitioners) who all work together to provide you with the care you need, when you need it.     Your next appointment:   7 month(s)  The format for your next appointment:   In Person  Provider:   Bryan Lemma, MD    Other Instructions   Elevate your feet  at 3 to 4 times a day  for 20 min at the time      Marykay Lex, MD, MS Bryan Lemma, M.D., M.S. Interventional Cardiologist  [facility]  Pager # [phone] Phone # [phone] [address] [place], [place] [address]   Thank you for choosing [facility] [place]!!

## 2022-08-24 NOTE — Patient Instructions (Addendum)
Medication Instructions:  Take your  Losartan    Take  Lasix ( furosemide)  at least twice a week   *If you need a refill on your cardiac medications before your next appointment, please call your pharmacy*  Other Instructions   Elevate your feet  at 3 to 4 times a day  for 20 min at the time   Lab Work:  Not needed   Testing/Procedures: Not needed   Follow-Up: At [facility], you and your health needs are our priority.  As part of our continuing mission to provide you with exceptional heart care, we have created designated Provider Care Teams.  These Care Teams include your primary Cardiologist (physician) and Advanced Practice Providers (APPs -  Physician Assistants and Nurse Practitioners) who all work together to provide you with the care you need, when you need it.     Your next appointment:   7 month(s)  The format for your next appointment:   In Person  Provider:   Bryan Lemma, MD    Other Instructions   Elevate your feet  at 3 to 4 times a day  for 20 min at the time

## 2022-08-29 ENCOUNTER — Encounter: Payer: Self-pay | Admitting: Cardiology

## 2022-08-29 NOTE — Assessment & Plan Note (Signed)
LDL 69 last years pretty much at goal.  She should be due for labs to be checked soon.  Currently not on medications for lipids.  Will monitor closely.  She is Trulicity for diabetes, wonder if she would benefit from Ozempic/Mounjaro or even potentially Wegovy/Zepbound. Also stop by potentially using SGLT2 inhibitor

## 2022-08-29 NOTE — Assessment & Plan Note (Addendum)
Some costosternal discomfort.  But nothing significant.  Has had pretty nonischemic evaluation with no obvious culprit.  Perhaps musculoskeletal pain.  Current symptoms seem to be well relieved with PPI suggesting that is probably GERD.

## 2022-08-29 NOTE — Assessment & Plan Note (Signed)
BP is high today on carvedilol 6.25 mg twice daily.  I found that she was not taking her losartan.  Recommended she restart losartan.  Most recent potassium was 4.5 so she should be fine.  Also taking her diuretic daily.  Additional doses as needed.

## 2022-08-29 NOTE — Assessment & Plan Note (Signed)
I am not sure if this is a real true diagnosis.  She has edema but no real PND orthopnea.  Echo does show some hypertensive heart disease with moderate LVH, but she really does not have PND orthopnea.  Of course being on CPAP could mask this.  She does have edema but not significant.   She takes daily Lasix but has not taken any additional doses.  I recommend that she take extra doses for weight gain more than 3 pounds or worsening edema. Recommend support stockings-knee-high and foot elevation as well as foot exercises.

## 2022-11-17 ENCOUNTER — Other Ambulatory Visit: Payer: Self-pay | Admitting: Physician Assistant

## 2023-03-25 ENCOUNTER — Ambulatory Visit: Payer: Medicare HMO | Attending: Cardiology | Admitting: Cardiology

## 2023-03-25 ENCOUNTER — Encounter: Payer: Self-pay | Admitting: Cardiology

## 2023-03-25 VITALS — BP 138/70 | HR 78 | Ht 63.0 in | Wt 283.0 lb

## 2023-03-25 DIAGNOSIS — R072 Precordial pain: Secondary | ICD-10-CM | POA: Diagnosis not present

## 2023-03-25 DIAGNOSIS — R931 Abnormal findings on diagnostic imaging of heart and coronary circulation: Secondary | ICD-10-CM | POA: Diagnosis not present

## 2023-03-25 DIAGNOSIS — E785 Hyperlipidemia, unspecified: Secondary | ICD-10-CM

## 2023-03-25 DIAGNOSIS — R002 Palpitations: Secondary | ICD-10-CM

## 2023-03-25 DIAGNOSIS — E1169 Type 2 diabetes mellitus with other specified complication: Secondary | ICD-10-CM

## 2023-03-25 DIAGNOSIS — G4733 Obstructive sleep apnea (adult) (pediatric): Secondary | ICD-10-CM | POA: Diagnosis not present

## 2023-03-25 DIAGNOSIS — I10 Essential (primary) hypertension: Secondary | ICD-10-CM

## 2023-03-25 MED ORDER — LOSARTAN POTASSIUM 25 MG PO TABS: 25.0000 mg | ORAL_TABLET | Freq: Every day | ORAL | 3 refills | Status: AC

## 2023-03-25 NOTE — Progress Notes (Unsigned)
Cardiology Office Note:  .   Date:  03/28/2023  ID:  Kaitlin Berry, DOB [birth year], MRN [id number] PCP: Barbie Banner, MD  [facility] Providers Cardiologist:  Bryan Lemma, MD     Chief Complaint  Patient presents with   Follow-up    7 months.   Hypertension   Palpitations    Patient Profile: .     Kaitlin Berry is a morbidly obese 78 y.o. female with a PMH reviewed below who presents here for 42-month follow-up at the request of Barbie Banner, MD. She transferred care from Dr. Mayford Knife to me because her husband is also a patient mine.  Notable PMH: Nonobstructive CAD. Coronary CTA 06/12/2021: Coronary calcium score 100 (68th percentile).  Minimal nonobstructive CAD in left main and RCA. Chronic Diastolic Heart Failure.-NYHA class II because of deconditioning/obesity Echo 06/16/2021: EF 70 to 75%.  Moderate LVH.  Grade I DD.  Mild LAE.  Trivial MR. Hypertension. Hyperlipidemia. Lipid panel 08/13/2021: LDL 69, HDL 33, TG 130, total 125. Insulin-dependent T2DM.-Followed by endocrinology Hypothyroidism. OSA. CKD Stage IV -> followed by nephrology.    Kaitlin Berry was last seen on August 24, 2023 for routine follow-up.  She was cavity by her husband [name].  She was doing better.  Was taking a PPI for GERD and a chest discomfort seem to be improving.  Breathing episodes were also better.  Not really noticing the chest pressure or tightness.  Wearing CPAP but otherwise has to use 2 pillows orthopnea.  Still was still mostly in the wheelchair but is able to walk around some in the house, being mostly limited by her knee and back pain..  She says her legs just very weak and she has poor balance.  Stable venous stasis swelling of 1-2+ bilateral.  No lesions. = Recommendation was to go back to taking her 50 mg losartan..  We also recommended that she furosemide at least twice a week and not just PRN.Marland Kitchen  Also recommend 3-4 times a day foot elevation.  Subjective  Discussed  the use of AI scribe software for clinical note transcription with the patient, who gave verbal consent to proceed.  History of Present Illness   The patient, is a morbidly obese woman with a history of Nonobstructive coronary artery disease, HTN, HLD & DM-2, & BLE Edema who presents with a concerning episode of palpitations and nausea that occurred a couple of weeks prior to the consultation. The patient described the sensation as a "fluttery" feeling in the heart, which was significant enough to cause alarm and necessitate rest. This episode was accompanied by nausea and resulted in the patient sleeping for the remainder of the day and most of the following day.  She has not had any other similar episodes.   The patient has been managing her heart condition with carvedilol but has not been taking losartan for some time - presumably was told to stop by a different doctor. The patient also reported not taking furosemide, a fluid pill, for several years as it was deemed unnecessary.  In terms of diabetes management, the patient has been switched from Trulicity to [facility] by her endocrinologist. The patient also uses a CPAP machine for sleep apnea and walks with a walker around the house.  The patient reported occasional leg swelling by the end of the day, which resolves upon elevating the feet. There were no reports of chest discomfort or tightness while walking, shortness of breath when lying flat, or waking  up in the middle of the night due to shortness of breath. The patient denied experiencing headaches, blurred vision, dizziness, or any stroke-like symptoms.  The patient's blood pressure is noted to be slightly elevated, and there was a discussion about restarting losartan for its kidney and heart protective benefits. The patient's cholesterol levels have not been checked since June of the previous year, and there was a recommendation to have these levels checked during the next endocrinology  appointment.     ROS:  Review of Systems - Negative except symptoms noted in HPI   Objective   Recent med changes,: Was not taking losartan, Trulicity converted to [facility], and not using Lasix.  Studies Reviewed: Marland Kitchen   EKG Interpretation Date/Time:  Friday March 25 2023 13:58:01 EST Ventricular Rate:  78 PR Interval:  176 QRS Duration:  102 QT Interval:  406 QTC Calculation: 462 R Axis:   -57  Text Interpretation: Normal sinus rhythm Left anterior fascicular block Poor R-wave progression When compared with ECG of 14-Jun-2022 14:00, PREVIOUS ECG IS PRESENT Confirmed by Bryan Lemma ([id number]) on 03/25/2023 2:14:35 PM    No new studies  Risk Assessment/Calculations:            Physical Exam:   VS:  BP 138/70   Pulse 78   Ht 5\' 3"  (1.6 m)   Wt 283 lb (128.4 kg)   BMI 50.13 kg/m    Wt Readings from Last 3 Encounters:  03/25/23 283 lb (128.4 kg)  08/24/22 286 lb 12.8 oz (130.1 kg)  06/18/22 268 lb (121.6 kg)    GEN: Morbidly obese.  Wheelchair-bound.  Chronically ill-appearing.  Notable lower extremity edema.  No acute distress.  Nontoxic; somewhat more talkative today than before, still somewhat subdued. NECK: No JVD; No carotid bruits-unable to really assess JVD CARDIAC: Normal S1, S2; RRR, no murmurs, rubs, gallops; unable to palpate PMI.  Difficult to palpate pedal pulses. RESPIRATORY:  Clear to auscultation without rales, wheezing or rhonchi ; nonlabored, good air movement. ABDOMEN: Soft, non-tender, non-distended EXTREMITIES: 1-2+ bilateral lower extremity edema with ankle tenderness.  No obvious venous stasis changes or ulcers..  No deformity     ASSESSMENT AND PLAN: .    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   Plan will be to simply continue risk factor modification with BP, lipid and glycemic control.   Obviously weight loss.  -Continue  beta-blocker and add back ARB -Continue Mounjaro. -Lipids appear to be pretty well-controlled on no medications.  Hopefully with weight loss on Mounjaro, lipids will stay stable.      Relevant Medications   losartan (COZAAR) 25 MG tablet   Essential hypertension - Primary (Chronic)   Borderline elevated BP today on carvedilol 6.25 mg daily-not taking her ARB. -Will restart losartan at 25 mg daily, for both renal and cardiac protection She will need to have chemistry panel which could also be done with lipids hoping this can be drawn at her next endocrinology visit. -Return to clinic in summer for blood pressure check and medication review.   -Endocrinologist to check full chemistry panel and cholesterol panel at next appointment.       Relevant Medications   losartan (COZAAR) 25 MG tablet   Other Relevant Orders   EKG 12-Lead (Completed)   Comprehensive metabolic panel   Hyperlipidemia associated with type 2 diabetes mellitus (HCC) (Chronic)  Hyperlipidemia   Last cholesterol check was in June 2023 and was within normal limits. Patient is not currently on any cholesterol-lowering medication.   -Request cholesterol check at next endocrinology appointment.  (This would be a chemistry panel and lipid panel along with A1c at her next endocrinology appointment)  Diabetes   Patient switched from Trulicity to [facility] by endocrinologist.   -Endocrinologist to monitor A1C and adjust treatment as necessary.        Relevant Medications   losartan (COZAAR) 25 MG tablet   Other Relevant Orders   Lipid panel   Comprehensive metabolic panel     Other   Morbid obesity (HCC) (Chronic)   As she is essentially immobilized and wheelchair-bound most of the time, we are limited as to what exercise she can do.  We discussed water aerobics and water walking. She was on Trulicity now switched to [facility] based on recent recommendations.      OBSTRUCTIVE SLEEP APNEA (Chronic)   Continue using  CPAP.      Palpitations   Reported episode of palpitations with associated nausea and fatigue. No recurrence since the episode.  EKG during the episode showed normal sinus rhythm.   -Advise to capture EKG with KardiaMobile during palpitations if she recurs.   -If palpitations persist, consider taking an extra dose of Carvedilol.        Precordial pain   Still having musculoskeletal type chest discomfort.  I think some of these symptoms are made more palpitations. .  Nonischemic evaluation in the past.  Monitor.      Relevant Orders   EKG 12-Lead (Completed)   Lipid panel   Comprehensive metabolic panel    Follow-Up: Return in about 6 months (around 09/22/2023) for Alternate 6 month follow-up with APP & MD.     Signed, Marykay Lex, MD, MS Bryan Lemma, M.D., M.S. Interventional Cardiologist  [facility]  Pager # [phone] Phone # [phone] [address] [place], [place] [address]

## 2023-03-25 NOTE — Patient Instructions (Addendum)
Medication Instructions:    Start taking Losartan 25 mg  daily   *If you need a refill on your cardiac medications before your next appointment, please call your pharmacy*   Lab Work: CMP LIPID  Please see if your Endocrinologist or primary can draw blood work when you og th either of their offices.   Testing/Procedures: Not needed   Follow-Up: At [facility], you and your health needs are our priority.  As part of our continuing mission to provide you with exceptional heart care, we have created designated Provider Care Teams.  These Care Teams include your primary Cardiologist (physician) and Advanced Practice Providers (APPs -  Physician Assistants and Nurse Practitioners) who all work together to provide you with the care you need, when you need it.     Your next appointment:   6 month(s)  The format for your next appointment:   In Person  Provider:   Bernadene Person, NP or Reather Littler, NP

## 2023-03-28 ENCOUNTER — Encounter: Payer: Self-pay | Admitting: Cardiology

## 2023-03-28 DIAGNOSIS — R002 Palpitations: Secondary | ICD-10-CM | POA: Insufficient documentation

## 2023-03-28 DIAGNOSIS — R931 Abnormal findings on diagnostic imaging of heart and coronary circulation: Secondary | ICD-10-CM | POA: Insufficient documentation

## 2023-03-28 NOTE — Assessment & Plan Note (Signed)
Hyperlipidemia   Last cholesterol check was in June 2023 and was within normal limits. Patient is not currently on any cholesterol-lowering medication.   -Request cholesterol check at next endocrinology appointment.  (This would be a chemistry panel and lipid panel along with A1c at her next endocrinology appointment)  Diabetes   Patient switched from Trulicity to [facility] by endocrinologist.   -Endocrinologist to monitor A1C and adjust treatment as necessary.

## 2023-03-28 NOTE — Assessment & Plan Note (Signed)
Relatively low risk findings on Coronary CTA.   Plan will be to simply continue risk factor modification with BP, lipid and glycemic control.   Obviously weight loss.  -Continue beta-blocker and add back ARB -Continue Mounjaro. -Lipids appear to be pretty well-controlled on no medications.  Hopefully with weight loss on Mounjaro, lipids will stay stable.

## 2023-03-28 NOTE — Assessment & Plan Note (Signed)
Reported episode of palpitations with associated nausea and fatigue. No recurrence since the episode.  EKG during the episode showed normal sinus rhythm.   -Advise to capture EKG with KardiaMobile during palpitations if she recurs.   -If palpitations persist, consider taking an extra dose of Carvedilol.

## 2023-03-28 NOTE — Assessment & Plan Note (Signed)
Continue using CPAP.

## 2023-03-28 NOTE — Assessment & Plan Note (Addendum)
Borderline elevated BP today on carvedilol 6.25 mg daily-not taking her ARB. -Will restart losartan at 25 mg daily, for both renal and cardiac protection She will need to have chemistry panel which could also be done with lipids hoping this can be drawn at her next endocrinology visit. -Return to clinic in summer for blood pressure check and medication review.   -Endocrinologist to check full chemistry panel and cholesterol panel at next appointment.

## 2023-03-28 NOTE — Assessment & Plan Note (Signed)
As she is essentially immobilized and wheelchair-bound most of the time, we are limited as to what exercise she can do.  We discussed water aerobics and water walking. She was on Trulicity now switched to [facility] based on recent recommendations.

## 2023-03-28 NOTE — Assessment & Plan Note (Signed)
Still having musculoskeletal type chest discomfort.  I think some of these symptoms are made more palpitations. .  Nonischemic evaluation in the past.  Monitor.

## 2023-06-15 DIAGNOSIS — R0600 Dyspnea, unspecified: Secondary | ICD-10-CM | POA: Diagnosis not present

## 2023-06-15 DIAGNOSIS — E7800 Pure hypercholesterolemia, unspecified: Secondary | ICD-10-CM | POA: Diagnosis not present

## 2023-06-15 DIAGNOSIS — E1121 Type 2 diabetes mellitus with diabetic nephropathy: Secondary | ICD-10-CM | POA: Diagnosis not present

## 2023-06-15 DIAGNOSIS — R0789 Other chest pain: Secondary | ICD-10-CM | POA: Diagnosis not present

## 2023-06-16 DIAGNOSIS — R82998 Other abnormal findings in urine: Secondary | ICD-10-CM | POA: Diagnosis not present

## 2023-06-16 DIAGNOSIS — E1121 Type 2 diabetes mellitus with diabetic nephropathy: Secondary | ICD-10-CM | POA: Diagnosis not present

## 2023-06-22 DIAGNOSIS — L400 Psoriasis vulgaris: Secondary | ICD-10-CM | POA: Diagnosis not present

## 2023-06-22 DIAGNOSIS — E782 Mixed hyperlipidemia: Secondary | ICD-10-CM | POA: Diagnosis not present

## 2023-06-22 DIAGNOSIS — G4733 Obstructive sleep apnea (adult) (pediatric): Secondary | ICD-10-CM | POA: Diagnosis not present

## 2023-06-22 DIAGNOSIS — I110 Hypertensive heart disease with heart failure: Secondary | ICD-10-CM | POA: Diagnosis not present

## 2023-06-22 DIAGNOSIS — R600 Localized edema: Secondary | ICD-10-CM | POA: Diagnosis not present

## 2023-06-22 DIAGNOSIS — E1121 Type 2 diabetes mellitus with diabetic nephropathy: Secondary | ICD-10-CM | POA: Diagnosis not present

## 2023-06-22 DIAGNOSIS — I5032 Chronic diastolic (congestive) heart failure: Secondary | ICD-10-CM | POA: Diagnosis not present

## 2023-06-22 DIAGNOSIS — Z0000 Encounter for general adult medical examination without abnormal findings: Secondary | ICD-10-CM | POA: Diagnosis not present

## 2023-08-12 ENCOUNTER — Other Ambulatory Visit: Payer: Self-pay

## 2023-08-12 MED ORDER — CARVEDILOL 6.25 MG PO TABS: 6.2500 mg | ORAL_TABLET | Freq: Two times a day (BID) | ORAL | 1 refills | Status: DC

## 2023-09-23 ENCOUNTER — Encounter: Payer: Self-pay | Admitting: Nurse Practitioner

## 2023-09-23 ENCOUNTER — Ambulatory Visit: Attending: Nurse Practitioner | Admitting: Nurse Practitioner

## 2023-09-23 VITALS — BP 124/68 | HR 86 | Ht 63.0 in | Wt 275.0 lb

## 2023-09-23 DIAGNOSIS — I10 Essential (primary) hypertension: Secondary | ICD-10-CM | POA: Diagnosis not present

## 2023-09-23 DIAGNOSIS — R002 Palpitations: Secondary | ICD-10-CM | POA: Diagnosis not present

## 2023-09-23 DIAGNOSIS — E785 Hyperlipidemia, unspecified: Secondary | ICD-10-CM | POA: Diagnosis not present

## 2023-09-23 DIAGNOSIS — E119 Type 2 diabetes mellitus without complications: Secondary | ICD-10-CM | POA: Diagnosis not present

## 2023-09-23 DIAGNOSIS — Z794 Long term (current) use of insulin: Secondary | ICD-10-CM

## 2023-09-23 DIAGNOSIS — R0609 Other forms of dyspnea: Secondary | ICD-10-CM

## 2023-09-23 DIAGNOSIS — R931 Abnormal findings on diagnostic imaging of heart and coronary circulation: Secondary | ICD-10-CM

## 2023-09-23 DIAGNOSIS — G4733 Obstructive sleep apnea (adult) (pediatric): Secondary | ICD-10-CM | POA: Diagnosis not present

## 2023-09-23 DIAGNOSIS — R0602 Shortness of breath: Secondary | ICD-10-CM

## 2023-09-23 NOTE — Patient Instructions (Addendum)
 Medication Instructions:  Resume Lasix  40 mg daily  *If you need a refill on your cardiac medications before your next appointment, please call your pharmacy*  Lab Work: BMET, BNP, CBC at your convenience   Testing/Procedures: Your physician has requested that you have an echocardiogram. Echocardiography is a painless test that uses sound waves to create images of your heart. It provides your doctor with information about the size and shape of your heart and how well your heart's chambers and valves are working. This procedure takes approximately one hour. There are no restrictions for this procedure. Please do NOT wear cologne, perfume, aftershave, or lotions (deodorant is allowed). Please arrive 15 minutes prior to your appointment time.  Please note: We ask at that you not bring children with you during ultrasound (echo/ vascular) testing. Due to room size and safety concerns, children are not allowed in the ultrasound rooms during exams. Our front office staff cannot provide observation of children in our lobby area while testing is being conducted. An adult accompanying a patient to their appointment will only be allowed in the ultrasound room at the discretion of the ultrasound technician under special circumstances. We apologize for any inconvenience.   Follow-Up: At [facility], you and your health needs are our priority.  As part of our continuing mission to provide you with exceptional heart care, our providers are all part of one team.  This team includes your primary Cardiologist (physician) and Advanced Practice Providers or APPs (Physician Assistants and Nurse Practitioners) who all work together to provide you with the care you need, when you need it.  Your next appointment:   6 month(s)  Provider:   Alm Clay, MD    We recommend signing up for the patient portal called MyChart.  Sign up information is provided on this After Visit Summary.  MyChart is used to  connect with patients for Virtual Visits (Telemedicine).  Patients are able to view lab/test results, encounter notes, upcoming appointments, etc.  Non-urgent messages can be sent to your provider as well.   To learn more about what you can do with MyChart, go to [email or web address].

## 2023-09-23 NOTE — Progress Notes (Signed)
 Office Visit    Patient Name: Kaitlin Berry Date of Encounter: 09/23/2023  Primary Care Provider:  Tanda Prentice DEL, MD Primary Cardiologist:  Alm Clay, MD  Chief Complaint    78 year old female with a history of elevated coronary artery calcium  score, palpitations, hypertension, hyperlipidemia, type 2 diabetes, GERD, OSA, and obesity who presents for follow-up related to chest pain and palpitations.  Past Medical History    Past Medical History:  Diagnosis Date   Anxiety    Arthritis    rt knee   BACK PAIN 03/24/2007   Qualifier: Diagnosis of  By: Corrie MD, Francis HERO    Chronic back pain    DEPRESSION 03/24/2007   Qualifier: Diagnosis of  By: Corrie MD, Francis HERO    Diabetes mellitus without complication, with long-term current use of insulin  (HCC)    GERD (gastroesophageal reflux disease)    Hyperlipidemia    HYPERSOMNIA 03/24/2007   Qualifier: Diagnosis of  By: Corrie MD, Francis HERO    Hypertension    Morbid obesity with BMI of 45.0-49.9, adult (HCC)    OBSTRUCTIVE SLEEP APNEA 05/17/2007   severe with AHI 35.1 on PSG 2009 - on CPAP   Psoriasis    Urticaria 11/27/2011   Past Surgical History:  Procedure Laterality Date   ABDOMINAL HYSTERECTOMY     BACK SURGERY     lumbar   BREAST LUMPECTOMY WITH RADIOACTIVE SEED LOCALIZATION Right 10/01/2014   Procedure: RIGHT BREAST LUMPECTOMY WITH RADIOACTIVE SEED LOCALIZATION;  Surgeon: Donnice Lima, MD;  Location: [facility];  Service: General;  Laterality: Right;   CHOLECYSTECTOMY     CT CTA CORONARY W/CA SCORE W/CM &/OR WO/CM  06/12/2021   Coronary Calcium  Score 100.  Minimal nonobstructive CAD (1 to 24%).  Consider nonatherosclerotic cause of chest pain or dyspnea.  Normal pulmonary vein drainage.  Normal left atrial appendage with no thrombus.  No significant noncardiac findings.   INCISIONAL BREAST BIOPSY     multiple   NM MYOVIEW  LTD  11/2016   Normal LVEF - NO ISCHEMIA OR INFARCTION -- LOW RISK / NORMAL    SPINE SURGERY     TRANSTHORACIC ECHOCARDIOGRAM  11/29/2016   a) 10/'18: EF 60-65%.  Nl LV size w/ mild LVH.  No RWMA.  GR 1 DD & Nl LA size.  Mild MAC.  Trivial MR.  Mildly calcified ApV (sclerosis w/o stenosis).  Nl RV size & fxn.  Nl RVP & RAP.;; b) 08/01/2019: EF 60-65%.  No RWMA.  Nl LV size & fxn.  GR 1 DD.  Nl LA size.  No MR.  Mild AoV sclerosis w/o stenosis.  Nl RV size & fxn.  Nl RAP.  Unable to assess PA.-Poor windows.   TRANSTHORACIC ECHOCARDIOGRAM  06/16/2021   EF 70 to 75%.  Hyperdynamic LV with no RWMA.  Moderate LVH.  GR 1 DD.  Mild LA dilation.  Normal valves.    Allergies  Allergies  Allergen Reactions   Atorvastatin Other (See Comments)   Ciprofloxacin  Other (See Comments) and Hives    Unknown   Ciprofloxacin  Hcl Hives   Spironolactone      hyperkalemia   Lisinopril Rash     Labs/Other Studies Reviewed    The following studies were reviewed today:  Cardiac Studies & Procedures   ______________________________________________________________________________________________   STRESS TESTS  MYOCARDIAL PERFUSION IMAGING 11/30/2016  Interpretation Summary  Nuclear stress EF: 73%. The left ventricular ejection fraction is hyperdynamic (>65%).  The study is normal.  This  is a low risk study.   ECHOCARDIOGRAM  ECHOCARDIOGRAM COMPLETE 06/16/2021  Narrative ECHOCARDIOGRAM REPORT    Patient Name:   Kaitlin Berry Date of Exam: 06/16/2021 Medical Rec #:  [id number]        Height:       63.0 in Accession #:    [id number]       Weight:       289.6 lb Date of Birth:  07-06-1945         BSA:          2.263 m Patient Age:    75 years         BP:           146/78 mmHg Patient Gender: F                HR:           81 bpm. Exam Location:  [address]  Procedure: 2D Echo, Cardiac Doppler and Color Doppler  Indications:    I50.9 CHF  History:        Patient has prior history of Echocardiogram examinations, most recent 08/01/2019. CHF, Signs/Symptoms:Chest Pain  and Edema; Risk Factors:Hypertension, Diabetes, Dyslipidemia, Sleep Apnea and Family History of Coronary Artery Disease. Morbid Obesity.  Sonographer:    Heather Hawks RDCS Referring Phys: ALM LELON CLAY   Sonographer Comments: Patient Declined the use of Definity Contrast IMPRESSIONS   1. Technically difficult; vigorous LV function. 2. Left ventricular ejection fraction, by estimation, is 70 to 75%. The left ventricle has hyperdynamic function. The left ventricle has no regional wall motion abnormalities. There is moderate left ventricular hypertrophy. Left ventricular diastolic parameters are consistent with Grade I diastolic dysfunction (impaired relaxation). Elevated left atrial pressure. 3. Right ventricular systolic function is normal. The right ventricular size is normal. 4. Left atrial size was mildly dilated. 5. The mitral valve was not well visualized. Trivial mitral valve regurgitation. No evidence of mitral stenosis. 6. The aortic valve has an indeterminant number of cusps. Aortic valve regurgitation is not visualized. No aortic stenosis is present.  FINDINGS Left Ventricle: Left ventricular ejection fraction, by estimation, is 70 to 75%. The left ventricle has hyperdynamic function. The left ventricle has no regional wall motion abnormalities. The left ventricular internal cavity size was normal in size. There is moderate left ventricular hypertrophy. Left ventricular diastolic parameters are consistent with Grade I diastolic dysfunction (impaired relaxation). Elevated left atrial pressure.  Right Ventricle: The right ventricular size is normal. Right ventricular systolic function is normal.  Left Atrium: Left atrial size was mildly dilated.  Right Atrium: Right atrial size was normal in size.  Pericardium: There is no evidence of pericardial effusion.  Mitral Valve: The mitral valve was not well visualized. Mild mitral annular calcification. Trivial mitral valve  regurgitation. No evidence of mitral valve stenosis.  Tricuspid Valve: The tricuspid valve is not well visualized. Tricuspid valve regurgitation is not demonstrated. No evidence of tricuspid stenosis.  Aortic Valve: The aortic valve has an indeterminant number of cusps. Aortic valve regurgitation is not visualized. No aortic stenosis is present. Aortic valve mean gradient measures 10.0 mmHg. Aortic valve peak gradient measures 16.8 mmHg. Aortic valve area, by VTI measures 2.49 cm.  Pulmonic Valve: The pulmonic valve was not well visualized. Pulmonic valve regurgitation is not visualized. No evidence of pulmonic stenosis.  Aorta: The aortic root is normal in size and structure.  Venous: The inferior vena cava was not well visualized.  IAS/Shunts: The interatrial septum  was not well visualized.  Additional Comments: Technically difficult; vigorous LV function.   LEFT VENTRICLE PLAX 2D LVIDd:         3.58 cm   Diastology LVIDs:         1.90 cm   LV e' medial:    4.68 cm/s LV PW:         1.62 cm   LV E/e' medial:  25.4 LV IVS:        1.38 cm   LV e' lateral:   7.07 cm/s LVOT diam:     2.15 cm   LV E/e' lateral: 16.8 LV SV:         99 LV SV Index:   44 LVOT Area:     3.63 cm   RIGHT VENTRICLE RV Basal diam:  3.50 cm RV S prime:     16.20 cm/s TAPSE (M-mode): 1.9 cm  LEFT ATRIUM              Index        RIGHT ATRIUM           Index LA diam:        4.70 cm  2.08 cm/m   RA Area:     16.60 cm LA Vol (A2C):   101.0 ml 44.63 ml/m  RA Volume:   49.20 ml  21.74 ml/m LA Vol (A4C):   82.4 ml  36.41 ml/m LA Biplane Vol: 90.9 ml  40.17 ml/m AORTIC VALVE AV Area (Vmax):    2.13 cm AV Area (Vmean):   1.80 cm AV Area (VTI):     2.49 cm AV Vmax:           205.00 cm/s AV Vmean:          148.500 cm/s AV VTI:            0.396 m AV Peak Grad:      16.8 mmHg AV Mean Grad:      10.0 mmHg LVOT Vmax:         120.00 cm/s LVOT Vmean:        73.550 cm/s LVOT VTI:          0.272  m LVOT/AV VTI ratio: 0.69  AORTA Ao Root diam: 3.00 cm Ao Asc diam:  2.90 cm  MITRAL VALVE MV Area (PHT): cm          SHUNTS MV Decel Time: 140 msec     Systemic VTI:  0.27 m MV E velocity: 119.00 cm/s  Systemic Diam: 2.15 cm MV A velocity: 135.00 cm/s MV E/A ratio:  0.88  Redell Shallow MD Electronically signed by Redell Shallow MD Signature Date/Time: 06/16/2021/2:20:27 PM    Final    MONITORS  CARDIAC EVENT MONITOR 10/03/2019  Narrative  Sinus bradycardia, normal sinus rhythm and sinus tachycardia. The average heart rate was 68bpm and ranged from 53-119bpm.  No arrhythmias.   CT SCANS  CT CORONARY MORPH W/CTA COR W/SCORE 06/12/2021  Addendum 06/12/2021  7:50 PM ADDENDUM REPORT: 06/12/2021 19:47  CLINICAL DATA:  78 Year old White Female  EXAM: Cardiac/Coronary  CTA  TECHNIQUE: The patient was scanned on a [employer].  FINDINGS: Scan was triggered in the descending thoracic aorta. Axial non-contrast 3 mm slices were carried out through the heart. The data set was analyzed on a dedicated work station and scored using the Agatson method. Gantry rotation speed was 250 msecs and collimation was .6 mm. 0.8 mg of sl NTG was given. The 3D  data set was reconstructed in 5% intervals of the 67-82 % of the R-R cycle. Diastolic phases were analyzed on a dedicated work station using MPR, MIP and VRT modes. The patient received 100 cc of contrast.  Aorta:  Normal size.  Aortic atherosclerosis.   No dissection.  Aortic Valve:  Tri-leaflet.  No calcifications.  Coronary Arteries:  Normal coronary origin.  Right dominance.  Coronary Calcium  Score:  Left main: 96  Left anterior descending artery: 0  Left circumflex artery: 0  Right coronary artery: 4  Total: 100  Percentile: 60th for age, sex, and race matched control.  RCA is a large dominant artery that gives rise to PDA and PLA. Minimal proximal-mid calcified plaque prior to marginal take  off.  Left main is a large artery that gives rise to LAD and LCX arteries. Minimal ostial calcified plaque.  LAD is a large vessel that gives rise to one large D1 Branch. There is no significant plaque.  LCX is a non-dominant artery that gives rise to one large OM1 branch. There is no significant plaque.  Other findings:  Normal pulmonary vein drainage into the left atrium.  Normal left atrial appendage without a thrombus.  Extra-cardiac findings: See attached radiology report for non-cardiac structures.  BMI related tissue attenuation artifact.  IMPRESSION: 1. Coronary calcium  score of 100. This was 60th percentile for age, sex, and race matched control.  2. Normal coronary origin with right dominance.  3. CAD-RADS 1. Minimal non-obstructive CAD (1-24%). Consider non-atherosclerotic causes of chest pain. Consider preventive therapy and risk factor modification.  RECOMMENDATIONS:  Coronary artery calcium  (CAC) score is a strong predictor of incident coronary heart disease (CHD) and provides predictive information beyond traditional risk factors. CAC scoring is reasonable to use in the decision to withhold, postpone, or initiate statin therapy in intermediate-risk or selected borderline-risk asymptomatic adults (age 25-75 years and LDL-C >=70 to <190 mg/dL) who do not have diabetes or established atherosclerotic cardiovascular disease (ASCVD).* In intermediate-risk (10-year ASCVD risk >=7.5% to <20%) adults or selected borderline-risk (10-year ASCVD risk >=5% to <7.5%) adults in whom a CAC score is measured for the purpose of making a treatment decision the following recommendations have been made:  If CAC = 0, it is reasonable to withhold statin therapy and reassess in 5 to 10 years, as long as higher risk conditions are absent (diabetes mellitus, family history of premature CHD in first degree relatives (males <55 years; females <65 years), cigarette smoking, LDL  >=190 mg/dL or other independent risk factors).  If CAC is 1 to 99, it is reasonable to initiate statin therapy for patients >=79 years of age.  If CAC is >=100 or >=75th percentile, it is reasonable to initiate statin therapy at any age.  Cardiology referral should be considered for patients with CAC scores =400 or >=75th percentile.  *2018 AHA/ACC/AACVPR/AAPA/ABC/ACPM/ADA/AGS/APhA/ASPC/NLA/PCNA Guideline on the Management of Blood Cholesterol: A Report of the American College of Cardiology/American Heart Association Task Force on Clinical Practice Guidelines. J Am Coll Cardiol. 2019;73(24):3168-3209.  Stanly Leavens, MD   Electronically Signed By: Stanly Leavens M.D. On: 06/12/2021 19:47  Narrative EXAM: OVER-READ INTERPRETATION  CT CHEST  The following report is a limited chest CT over-read performed by radiologist Dr. Elsie Ko [facility] on 06/12/2021. This over-read does not include interpretation of cardiac or coronary anatomy or pathology. The coronary CTA interpretation by the cardiologist is attached.  COMPARISON:  Chest CTA 11/16/2016  FINDINGS: Cardiovascular: There are no significant extracardiac vascular  findings.  Mediastinum/Nodes: There are no enlarged lymph nodes within the visualized mediastinum.  Lungs/Pleura: There is no pleural effusion. Mild linear scarring or atelectasis at both lung bases. No suspicious pulmonary nodules.  Upper abdomen: No significant findings in the visualized upper abdomen.  Musculoskeletal/Chest wall: No chest wall mass or suspicious osseous findings within the visualized chest.  IMPRESSION: No significant extracardiac findings within the visualized chest.  Electronically Signed: By: Elsie Perone M.D. On: 06/12/2021 15:58     ______________________________________________________________________________________________     Recent Labs: No results found for requested labs  within last 365 days.  Recent Lipid Panel    Component Value Date/Time   CHOL 125 08/13/2021 1215   TRIG 130 08/13/2021 1215   HDL 33 (L) 08/13/2021 1215   CHOLHDL 3.8 08/13/2021 1215   LDLCALC 69 08/13/2021 1215    History of Present Illness    78 year old female with the above past medical history including elevated coronary artery calcium  score, palpitations, hypertension, hyperlipidemia, type 2 diabetes, GERD, OSA, and obesity.  Stress test in 2018 was negative for ischemia.  Outpatient cardiac monitor in 07/2019 was normal.  Coronary CT angiogram in 05/2021 revealed coronary calcium  score of 100 (60th percentile), minimal nonobstructive CAD.  Most recent echocardiogram in 05/2021 showed EF 70 to 75%, hyperdynamic LV function, no RWMA, moderate LVH, G1 DD, normal RV systolic function, no significant valvular abnormalities. She was last seen in the office on 03/25/2023 and was stable from a cardiac standpoint.  She reported musculoskeletal type chest discomfort.  She presents today for follow-up accompanied by her husband. Since her last visit she has been stable from a cardiac standpoint, she reports nearly 18-month history of increased shortness of breath with activity.  She denies any chest pain, palpitations, dizziness, edema, PND, orthopnea, weight gain.  She has lost weight on Mounjaro.  Other than her shortness of breath, she denies any additional concerns today.  Home Medications    Current Outpatient Medications  Medication Sig Dispense Refill   blood glucose meter kit and supplies KIT Dispense based on patient and insurance preference. Use up to four times daily as directed. (FOR ICD-9 250.00, 250.01). 1 each 0   carvedilol  (COREG ) 6.25 MG tablet Take 1 tablet (6.25 mg total) by mouth 2 (two) times daily. 180 tablet 1   citalopram  (CELEXA ) 40 MG tablet Take 40 mg by mouth daily.     Dulaglutide  (TRULICITY ) 0.75 MG/0.5ML SOPN Inject 0.75 mg into the skin once a week.     furosemide   (LASIX ) 40 MG tablet Take 1 tablet (40 mg total) by mouth daily. 30 tablet 0   HYDROcodone -acetaminophen  (NORCO) 10-325 MG tablet Take 1 tablet by mouth daily as needed.     insulin  degludec (TRESIBA  FLEXTOUCH) 100 UNIT/ML FlexTouch Pen Inject 35 Units into the skin daily.     losartan  (COZAAR ) 25 MG tablet Take 1 tablet (25 mg total) by mouth daily. 90 tablet 3   MOUNJARO 10 MG/0.5ML Pen Inject 10 mg into the skin once a week.     omeprazole (PRILOSEC) 40 MG capsule Take 1 capsule by mouth daily.     sodium bicarbonate  650 MG tablet Take 1 tablet (650 mg total) by mouth 3 (three) times daily. 30 tablet 0   STELARA 90 MG/ML SOSY injection Inject 90 mg into the skin every 3 (three) months.     vitamin B-12 (CYANOCOBALAMIN ) 1000 MCG tablet Take 1,000 mcg by mouth daily.     No current facility-administered medications for  this visit.     Review of Systems    She denies chest pain, palpitations, pnd, orthopnea, n, v, dizziness, syncope, edema, weight gain, or early satiety. All other systems reviewed and are otherwise negative except as noted above.   Physical Exam    VS:  BP 124/68   Pulse 86   Ht 5' 3 (1.6 m)   Wt 275 lb (124.7 kg)   SpO2 95%   BMI 48.71 kg/m  GEN: Well nourished, well developed, in no acute distress. HEENT: normal. Neck: Supple, no JVD, carotid bruits, or masses. Cardiac: RRR, no murmurs, rubs, or gallops. No clubbing, cyanosis, edema.  Radials/DP/PT 2+ and equal bilaterally.  Respiratory:  Respirations regular and unlabored, clear to auscultation bilaterally. GI: Soft, nontender, nondistended, BS + x 4. MS: no deformity or atrophy. Skin: warm and dry, no rash. Neuro:  Strength and sensation are intact. Psych: Normal affect.  Accessory Clinical Findings    ECG personally reviewed by me today - EKG Interpretation Date/Time:  Friday September 23 2023 16:41:21 EDT Ventricular Rate:  81 PR Interval:  168 QRS Duration:  98 QT Interval:  386 QTC Calculation: 448 R  Axis:   -61  Text Interpretation: Normal sinus rhythm Left anterior fascicular block When compared with ECG of 25-Mar-2023 13:58, No significant change was found Confirmed by Daneen Perkins ([id number]) on 09/23/2023 4:42:18 PM  - no acute changes.   Lab Results  Component Value Date   WBC 8.3 06/14/2022   HGB 12.1 06/14/2022   HCT 37.4 06/14/2022   MCV 91.7 06/14/2022   PLT 187 06/14/2022   Lab Results  Component Value Date   CREATININE 1.64 (H) 06/14/2022   BUN 38 (H) 06/14/2022   NA 135 06/14/2022   K 4.5 06/14/2022   CL 107 06/14/2022   CO2 17 (L) 06/14/2022   Lab Results  Component Value Date   ALT 30 06/14/2022   AST 32 06/14/2022   ALKPHOS 159 (H) 06/14/2022   BILITOT 0.4 06/14/2022   Lab Results  Component Value Date   CHOL 125 08/13/2021   HDL 33 (L) 08/13/2021   LDLCALC 69 08/13/2021   TRIG 130 08/13/2021   CHOLHDL 3.8 08/13/2021    Lab Results  Component Value Date   HGBA1C 7.8 (H) 08/13/2021    Assessment & Plan    1. Elevated coronary artery calcium  score/dyspnea on exertion: Coronary CT angiogram in 05/2021 revealed coronary calcium  score of 100 (60th percentile), minimal nonobstructive CAD.  Most recent echocardiogram in 05/2021 showed EF 70 to 75%, hyperdynamic LV function, no RWMA, moderate LVH, G1 DD, normal RV systolic function, no significant valvular abnormalities.  Reports a nearly 27-month history of increased shortness of breath with activity.  She denies chest pain, palpitations, dizziness, edema, PND, with apnea, weight gain.  Generally euvolemic and well compensated on exam.  Will check BNP, BMET, CBC today. Will update echocardiogram.  We discussed that if her symptoms persist and echocardiogram unremarkable, consider need for ischemic evaluation.  2. Palpitations: She denies any recent palpitations.  Continue carvedilol .  3. Hypertension: BP initially elevated in office today, improved with recheck.  Continue current antihypertensive regimen.  4.  Hyperlipidemia: LDL was 93 in 05/2023.  Monitored and managed per PCP.  She is not on statin therapy at this time.  Consider repeat labs at follow-up.  5. Type 2 diabetes/obesity: A1c was 10.5 in 05/2023. She has successfully lost weight on Mounjaro.  Follows with endocrinology.  6. OSA: Adherent to CPAP.  7. Disposition: Follow-up in 6 months, sooner if needed.      Damien JAYSON Braver, NP 09/23/2023, 4:43 PM

## 2023-09-26 ENCOUNTER — Encounter: Payer: Self-pay | Admitting: Nurse Practitioner

## 2023-10-21 ENCOUNTER — Ambulatory Visit (INDEPENDENT_AMBULATORY_CARE_PROVIDER_SITE_OTHER)

## 2023-10-21 DIAGNOSIS — R0609 Other forms of dyspnea: Secondary | ICD-10-CM

## 2023-10-21 LAB — ECHOCARDIOGRAM COMPLETE
AR max vel: 1.78 cm2
AV Area VTI: 2.15 cm2
AV Area mean vel: 1.87 cm2
AV Mean grad: 5 mmHg
AV Peak grad: 8.9 mmHg
Ao pk vel: 1.49 m/s
Area-P 1/2: 4.36 cm2

## 2023-10-27 ENCOUNTER — Ambulatory Visit: Payer: Self-pay | Admitting: Nurse Practitioner

## 2024-01-04 ENCOUNTER — Ambulatory Visit: Payer: Self-pay

## 2024-01-04 NOTE — Telephone Encounter (Signed)
 FYI Only or Action Required?: FYI only for provider: Going to UC for symtoms.  Patient was last seen in primary care on unknown.  Called Nurse Triage reporting Wrist Pain.  Symptoms began about a month ago.  Interventions attempted: OTC medications: Tylenol  and Prescription medications: Gabapentin.  Symptoms are: gradually worsening.  Triage Disposition: See HCP Within 4 Hours (Or PCP Triage)  Patient/caregiver understands and will follow disposition?: Yes          Copied from CRM [id number]. Topic: Clinical - Red Word Triage >> Jan 04, 2024  9:49 AM Alfonso HERO wrote: Red Word that prompted transfer to Nurse Triage: neuropathy pain Reason for Disposition  [1] SEVERE pain (e.g., excruciating, unable to use hand at all) AND [2] not improved after 2 hours of pain medicine  Answer Assessment - Initial Assessment Questions Tylenol  and gabapentin for symptoms. Patient requesting to establish care with a PCP, appointment made by this RN. Pt given information for [facility]) to be seen today. Pt states she will walk in.   1. ONSET: When did the pain start?     1 month ago  2. LOCATION: Where is the pain located?     R wrist/hand  3. PAIN: How bad is the pain? (Scale 1-10; or mild, moderate, severe)   - MILD (1-3): doesn't interfere with normal activities   - MODERATE (4-7): interferes with normal activities (e.g., work or school) or awakens from sleep   - SEVERE (8-10): excruciating pain, unable to use hand at all     8/10 4. CAUSE: What do you think is causing the pain?     Neuropathy  5. OTHER SYMPTOMS: Do you have any other symptoms? (e.g., neck pain, swelling, rash, numbness, fever)     Denies  Protocols used: Hand and Wrist Pain-A-AH

## 2024-01-06 ENCOUNTER — Ambulatory Visit
Admission: EM | Admit: 2024-01-06 | Discharge: 2024-01-06 | Disposition: A | Discharge status: Home / Self Care | Attending: Family Medicine | Admitting: Family Medicine

## 2024-01-06 ENCOUNTER — Ambulatory Visit

## 2024-01-06 DIAGNOSIS — M25531 Pain in right wrist: Secondary | ICD-10-CM

## 2024-01-06 MED ORDER — DICLOFENAC SODIUM 1 % EX GEL: 2.0000 g | Freq: Three times a day (TID) | CUTANEOUS | 0 refills | Status: AC | PRN

## 2024-01-06 NOTE — ED Provider Notes (Addendum)
 [facility]  Note:  This document was prepared using Conservation officer, historic buildings and may include unintentional dictation errors.  MRN: [id number] DOB: [birth year]  Subjective:   Kaitlin Berry is a 78 y.o. female presenting for 3-week history of persistent right wrist pain that radiates into the hand and forearm.  No fall, trauma, warmth, history of gout.  No rashes.  Patient has been using gabapentin without much relief.  Tylenol  does help some.  Does not have an orthopedist.  No current facility-administered medications for this encounter.  Current Outpatient Medications:    blood glucose meter kit and supplies KIT, Dispense based on patient and insurance preference. Use up to four times daily as directed. (FOR ICD-9 250.00, 250.01)., Disp: 1 each, Rfl: 0   carvedilol  (COREG ) 6.25 MG tablet, Take 1 tablet (6.25 mg total) by mouth 2 (two) times daily., Disp: 180 tablet, Rfl: 1   citalopram  (CELEXA ) 40 MG tablet, Take 40 mg by mouth daily., Disp: , Rfl:    Dulaglutide  (TRULICITY ) 0.75 MG/0.5ML SOPN, Inject 0.75 mg into the skin once a week., Disp: , Rfl:    furosemide  (LASIX ) 40 MG tablet, Take 1 tablet (40 mg total) by mouth daily., Disp: 30 tablet, Rfl: 0   HYDROcodone -acetaminophen  (NORCO) 10-325 MG tablet, Take 1 tablet by mouth daily as needed., Disp: , Rfl:    insulin  degludec (TRESIBA  FLEXTOUCH) 100 UNIT/ML FlexTouch Pen, Inject 35 Units into the skin daily., Disp: , Rfl:    losartan  (COZAAR ) 25 MG tablet, Take 1 tablet (25 mg total) by mouth daily., Disp: 90 tablet, Rfl: 3   MOUNJARO 10 MG/0.5ML Pen, Inject 10 mg into the skin once a week., Disp: , Rfl:    omeprazole (PRILOSEC) 40 MG capsule, Take 1 capsule by mouth daily., Disp: , Rfl:    sodium bicarbonate  650 MG tablet, Take 1 tablet (650 mg total) by mouth 3 (three) times daily., Disp: 30 tablet, Rfl: 0   STELARA 90 MG/ML SOSY injection, Inject 90 mg into the skin every 3 (three) months., Disp: ,  Rfl:    vitamin B-12 (CYANOCOBALAMIN ) 1000 MCG tablet, Take 1,000 mcg by mouth daily., Disp: , Rfl:    Allergies  Allergen Reactions   Atorvastatin Other (See Comments)   Ciprofloxacin  Other (See Comments) and Hives    Unknown   Ciprofloxacin  Hcl Hives   Spironolactone      hyperkalemia   Lisinopril Rash    Past Medical History:  Diagnosis Date   Anxiety    Arthritis    rt knee   BACK PAIN 03/24/2007   Qualifier: Diagnosis of  By: Corrie MD, Francis HERO    Chronic back pain    DEPRESSION 03/24/2007   Qualifier: Diagnosis of  By: Corrie MD, Francis HERO    Diabetes mellitus without complication, with long-term current use of insulin  (HCC)    GERD (gastroesophageal reflux disease)    Hyperlipidemia    HYPERSOMNIA 03/24/2007   Qualifier: Diagnosis of  By: Corrie MD, Francis HERO    Hypertension    Morbid obesity with BMI of 45.0-49.9, adult (HCC)    OBSTRUCTIVE SLEEP APNEA 05/17/2007   severe with AHI 35.1 on PSG 2009 - on CPAP   Psoriasis    Urticaria 11/27/2011     Past Surgical History:  Procedure Laterality Date   ABDOMINAL HYSTERECTOMY     BACK SURGERY     lumbar   BREAST LUMPECTOMY WITH RADIOACTIVE SEED LOCALIZATION Right 10/01/2014   Procedure: RIGHT  BREAST LUMPECTOMY WITH RADIOACTIVE SEED LOCALIZATION;  Surgeon: Donnice Lima, MD;  Location:  SURGERY CENTER;  Service: General;  Laterality: Right;   CHOLECYSTECTOMY     CT CTA CORONARY W/CA SCORE W/CM &/OR WO/CM  06/12/2021   Coronary Calcium  Score 100.  Minimal nonobstructive CAD (1 to 24%).  Consider nonatherosclerotic cause of chest pain or dyspnea.  Normal pulmonary vein drainage.  Normal left atrial appendage with no thrombus.  No significant noncardiac findings.   INCISIONAL BREAST BIOPSY     multiple   NM MYOVIEW  LTD  11/2016   Normal LVEF - NO ISCHEMIA OR INFARCTION -- LOW RISK / NORMAL   SPINE SURGERY     TRANSTHORACIC ECHOCARDIOGRAM  11/29/2016   a) 10/'18: EF 60-65%.  Nl LV size w/ mild LVH.  No RWMA.  GR  1 DD & Nl LA size.  Mild MAC.  Trivial MR.  Mildly calcified ApV (sclerosis w/o stenosis).  Nl RV size & fxn.  Nl RVP & RAP.;; b) 08/01/2019: EF 60-65%.  No RWMA.  Nl LV size & fxn.  GR 1 DD.  Nl LA size.  No MR.  Mild AoV sclerosis w/o stenosis.  Nl RV size & fxn.  Nl RAP.  Unable to assess PA.-Poor windows.   TRANSTHORACIC ECHOCARDIOGRAM  06/16/2021   EF 70 to 75%.  Hyperdynamic LV with no RWMA.  Moderate LVH.  GR 1 DD.  Mild LA dilation.  Normal valves.    Family History  Problem Relation Age of Onset   Alzheimer's disease Mother    Diabetes Father    Cancer Father    Heart disease Father    Diabetes Brother    Heart attack Brother    Lung cancer Brother    Stroke Brother     Social History   Tobacco Use   Smoking status: Never   Smokeless tobacco: Never  Vaping Use   Vaping status: Never Used  Substance Use Topics   Alcohol use: Yes    Comment: social   Drug use: No    ROS   Objective:   Vitals: BP (!) 183/73 (BP Location: Left Arm)   Pulse 78   Temp 98.1 F (36.7 C) (Oral)   Resp 17   SpO2 97%   Physical Exam Constitutional:      General: She is not in acute distress.    Appearance: Normal appearance. She is well-developed. She is not ill-appearing, toxic-appearing or diaphoretic.  HENT:     Head: Normocephalic and atraumatic.     Nose: Nose normal.     Mouth/Throat:     Mouth: Mucous membranes are moist.  Eyes:     General: No scleral icterus.       Right eye: No discharge.        Left eye: No discharge.     Extraocular Movements: Extraocular movements intact.  Cardiovascular:     Rate and Rhythm: Normal rate.  Pulmonary:     Effort: Pulmonary effort is normal.  Musculoskeletal:       Arms:  Skin:    General: Skin is warm and dry.  Neurological:     General: No focal deficit present.     Mental Status: She is alert and oriented to person, place, and time.  Psychiatric:        Mood and Affect: Mood normal.        Behavior: Behavior normal.      Assessment and Plan :   PDMP not reviewed this  encounter.  1. Right wrist pain    Creatinine clearance was calculated 34-52 mL/min based off her last creatinine level from 06/15/2023.  Given her chronic conditions of CKD, CHF, uncontrolled diabetes I recommended diclofenac gel as her best option to manage suspected inflammatory pain due to possible arthritis, strain, neuropathy.  Also advised that she follow-up with an orthopedic specialist for further management including localized treatments.  Patient ultimately refused x-ray.  The order was canceled.  Counseled patient on potential for adverse effects with medications prescribed/recommended today, ER and return-to-clinic precautions discussed, patient verbalized understanding.     Christopher Savannah, [place] 01/06/24 [id number]

## 2024-01-06 NOTE — ED Triage Notes (Signed)
 Pt reports pain in right arm and right wrist x 3 weeks. States is getting worse. Pain is worse with movement. Gabapentin prescribed by PCP gives no relief. Tylenol  gives relief sometimes.  Denies any fall.

## 2024-02-05 ENCOUNTER — Emergency Department (HOSPITAL_COMMUNITY)

## 2024-02-05 ENCOUNTER — Emergency Department (HOSPITAL_COMMUNITY)
Admission: EM | Admit: 2024-02-05 | Discharge: 2024-02-05 | Disposition: A | Discharge status: Home / Self Care | Attending: Emergency Medicine | Admitting: Emergency Medicine

## 2024-02-05 DIAGNOSIS — E119 Type 2 diabetes mellitus without complications: Secondary | ICD-10-CM | POA: Diagnosis not present

## 2024-02-05 DIAGNOSIS — M25562 Pain in left knee: Secondary | ICD-10-CM

## 2024-02-05 DIAGNOSIS — I10 Essential (primary) hypertension: Secondary | ICD-10-CM | POA: Diagnosis not present

## 2024-02-05 DIAGNOSIS — W010XXA Fall on same level from slipping, tripping and stumbling without subsequent striking against object, initial encounter: Secondary | ICD-10-CM | POA: Diagnosis not present

## 2024-02-05 DIAGNOSIS — R8290 Unspecified abnormal findings in urine: Secondary | ICD-10-CM | POA: Insufficient documentation

## 2024-02-05 DIAGNOSIS — W19XXXA Unspecified fall, initial encounter: Secondary | ICD-10-CM

## 2024-02-05 LAB — CBC WITH DIFFERENTIAL/PLATELET
Abs Immature Granulocytes: 0.08 K/uL — ABNORMAL HIGH (ref 0.00–0.07)
Basophils Absolute: 0 K/uL (ref 0.0–0.1)
Basophils Relative: 0 %
Eosinophils Absolute: 0.2 K/uL (ref 0.0–0.5)
Eosinophils Relative: 2 %
HCT: 34.8 % — ABNORMAL LOW (ref 36.0–46.0)
Hemoglobin: 10.7 g/dL — ABNORMAL LOW (ref 12.0–15.0)
Immature Granulocytes: 1 %
Lymphocytes Relative: 9 %
Lymphs Abs: 1 K/uL (ref 0.7–4.0)
MCH: 28.5 pg (ref 26.0–34.0)
MCHC: 30.7 g/dL (ref 30.0–36.0)
MCV: 92.6 fL (ref 80.0–100.0)
Monocytes Absolute: 1 K/uL (ref 0.1–1.0)
Monocytes Relative: 9 %
Neutro Abs: 9.3 K/uL — ABNORMAL HIGH (ref 1.7–7.7)
Neutrophils Relative %: 79 %
Platelets: 278 K/uL (ref 150–400)
RBC: 3.76 MIL/uL — ABNORMAL LOW (ref 3.87–5.11)
RDW: 12.9 % (ref 11.5–15.5)
WBC: 11.6 K/uL — ABNORMAL HIGH (ref 4.0–10.5)
nRBC: 0 % (ref 0.0–0.2)

## 2024-02-05 LAB — URINALYSIS, ROUTINE W REFLEX MICROSCOPIC
Bilirubin Urine: NEGATIVE
Glucose, UA: NEGATIVE mg/dL
Ketones, ur: NEGATIVE mg/dL
Nitrite: POSITIVE — AB
Protein, ur: 100 mg/dL — AB
Specific Gravity, Urine: 1.016 (ref 1.005–1.030)
WBC, UA: >50 WBC/hpf (ref 0–5)
pH: 5 (ref 5.0–8.0)

## 2024-02-05 LAB — BASIC METABOLIC PANEL WITH GFR
Anion gap: 12 (ref 5–15)
BUN: 40 mg/dL — ABNORMAL HIGH (ref 8–23)
CO2: 16 mmol/L — ABNORMAL LOW (ref 22–32)
Calcium: 8.6 mg/dL — ABNORMAL LOW (ref 8.9–10.3)
Chloride: 108 mmol/L (ref 98–111)
Creatinine, Ser: 2.16 mg/dL — ABNORMAL HIGH (ref 0.44–1.00)
GFR, Estimated: 23 mL/min — ABNORMAL LOW (ref >60)
Glucose, Bld: 200 mg/dL — ABNORMAL HIGH (ref 70–99)
Potassium: 5.1 mmol/L (ref 3.5–5.1)
Sodium: 136 mmol/L (ref 135–145)

## 2024-02-05 MED ORDER — NYSTATIN 100000 UNIT/GM EX POWD: 1.0000 | Freq: Three times a day (TID) | CUTANEOUS | 3 refills | Status: AC

## 2024-02-05 MED ORDER — HYDROCODONE-ACETAMINOPHEN 5-325 MG PO TABS
1.0000 | ORAL_TABLET | Freq: Once | ORAL | Status: AC
  Administered 2024-02-05: 1 via ORAL
  Filled 2024-02-05: qty 1

## 2024-02-05 NOTE — ED Notes (Signed)
 Pt refuses xray at this time. Will make provider aware

## 2024-02-05 NOTE — ED Triage Notes (Signed)
 Pt arrives via [facility] from home for a fall from standing. Per EMS, pt has had gradual steady decline in mobility over the last several months, and usually will not get up without husband. Tonight she tried getting up and using her walker without his assistance and she tripped and fell. Pt c/o chronic pain to bilateral legs. No new injuries reported from fall. Pt A+Ox4, not anticoagulated, no head injury/LOC.

## 2024-02-05 NOTE — Discharge Instructions (Addendum)
 You were evaluated in the Emergency Department and after careful evaluation, we did not find any emergent condition requiring admission or further testing in the hospital.  Your exam/testing today is overall reassuring.  You may have some soreness from the fall.  We also noticed that you likely have a fungal rash.  Use the nystatin  powder as directed on your rash.  Follow-up with your primary care doctor.  Please return to the Emergency Department if you experience any worsening of your condition.   Thank you for allowing us  to be a part of your care.

## 2024-02-05 NOTE — ED Notes (Signed)
 Pt and husband state that pt's husband can get her home walker and wheelchair and will be able to get pt from car to house, Pt is able to stand and weightbear with walker assistance and able to pivot to wheelchair.

## 2024-02-05 NOTE — ED Notes (Signed)
 UA sent to lab

## 2024-02-05 NOTE — ED Notes (Signed)
 Pt soiled bed with urine attempting to use bedpan, pericare completed and new linens and gown placed on pt. Pt repositioned in bed.

## 2024-02-05 NOTE — ED Provider Notes (Signed)
 [facility] Emergency Department Provider Note MRN:  [id number]  Arrival date & time: 02/05/24     Chief Complaint   Fall   History of Present Illness   Kaitlin Berry is a 78 y.o. year-old female with history of hyperlipidemia, diabetes presenting to the ED with chief complaint of fall.  Patient stumbled and fell at home.  She was using the walker to walk to the stair lift assist.  During that transition she stumbled and fell into a low chair.  She could not get up and her husband could not get her up either and so they called EMS.  She denies head trauma, no loss of consciousness, no neck or back pain, no chest pain or shortness of breath, no abdominal pain.  Endorsing some new left knee pain, no other complaints.  Also has had a red rash beneath her pannus and left breast for quite a while.  Review of Systems  A thorough review of systems was obtained and all systems are negative except as noted in the HPI and PMH.   Patient's Health History    Past Medical History:  Diagnosis Date   Anxiety    Arthritis    rt knee   BACK PAIN 03/24/2007   Qualifier: Diagnosis of  By: Corrie MD, Francis HERO    Chronic back pain    DEPRESSION 03/24/2007   Qualifier: Diagnosis of  By: Corrie MD, Francis HERO    Diabetes mellitus without complication, with long-term current use of insulin  (HCC)    GERD (gastroesophageal reflux disease)    Hyperlipidemia    HYPERSOMNIA 03/24/2007   Qualifier: Diagnosis of  By: Corrie MD, Francis HERO    Hypertension    Morbid obesity with BMI of 45.0-49.9, adult (HCC)    OBSTRUCTIVE SLEEP APNEA 05/17/2007   severe with AHI 35.1 on PSG 2009 - on CPAP   Psoriasis    Urticaria 11/27/2011    Past Surgical History:  Procedure Laterality Date   ABDOMINAL HYSTERECTOMY     BACK SURGERY     lumbar   BREAST LUMPECTOMY WITH RADIOACTIVE SEED LOCALIZATION Right 10/01/2014   Procedure: RIGHT BREAST LUMPECTOMY WITH RADIOACTIVE SEED LOCALIZATION;  Surgeon:  Donnice Lima, MD;  Location: [facility];  Service: General;  Laterality: Right;   CHOLECYSTECTOMY     CT CTA CORONARY W/CA SCORE W/CM &/OR WO/CM  06/12/2021   Coronary Calcium  Score 100.  Minimal nonobstructive CAD (1 to 24%).  Consider nonatherosclerotic cause of chest pain or dyspnea.  Normal pulmonary vein drainage.  Normal left atrial appendage with no thrombus.  No significant noncardiac findings.   INCISIONAL BREAST BIOPSY     multiple   NM MYOVIEW  LTD  11/2016   Normal LVEF - NO ISCHEMIA OR INFARCTION -- LOW RISK / NORMAL   SPINE SURGERY     TRANSTHORACIC ECHOCARDIOGRAM  11/29/2016   a) 10/'18: EF 60-65%.  Nl LV size w/ mild LVH.  No RWMA.  GR 1 DD & Nl LA size.  Mild MAC.  Trivial MR.  Mildly calcified ApV (sclerosis w/o stenosis).  Nl RV size & fxn.  Nl RVP & RAP.;; b) 08/01/2019: EF 60-65%.  No RWMA.  Nl LV size & fxn.  GR 1 DD.  Nl LA size.  No MR.  Mild AoV sclerosis w/o stenosis.  Nl RV size & fxn.  Nl RAP.  Unable to assess PA.-Poor windows.   TRANSTHORACIC ECHOCARDIOGRAM  06/16/2021   EF 70 to 75%.  Hyperdynamic LV with no RWMA.  Moderate LVH.  GR 1 DD.  Mild LA dilation.  Normal valves.    Family History  Problem Relation Age of Onset   Alzheimer's disease Mother    Diabetes Father    Cancer Father    Heart disease Father    Diabetes Brother    Heart attack Brother    Lung cancer Brother    Stroke Brother     Social History   Socioeconomic History   Marital status: Married    Spouse name: Not on file   Number of children: Not on file   Years of education: Not on file   Highest education level: Not on file  Occupational History   Not on file  Tobacco Use   Smoking status: Never   Smokeless tobacco: Never  Vaping Use   Vaping status: Never Used  Substance and Sexual Activity   Alcohol use: Yes    Comment: social   Drug use: No   Sexual activity: Not Currently  Other Topics Concern   Not on file  Social History Narrative   Not on file    Social Drivers of Health   Financial Resource Strain: Low Risk  (12/08/2020)   Received from [facility] visits prior to 05/01/2022.   Overall Financial Resource Strain (CARDIA)    Difficulty of Paying Living Expenses: Not hard at all  Food Insecurity: Low Risk (06/22/2023)   Received from [facility]   Hunger Vital Sign    Within the past 12 months, you worried that your food would run out before you got money to buy more: Never true    Within the past 12 months, the food you bought just didn't last and you didn't have money to get more. : Never true  Transportation Needs: No Transportation Needs (06/22/2023)   Received from [facility]    In the past 12 months, has lack of reliable transportation kept you from medical appointments, meetings, work or from getting things needed for daily living? : No  Physical Activity: Inactive (12/08/2020)   Received from [facility] visits prior to 05/01/2022.   Exercise Vital Sign    On average, how many days per week do you engage in moderate to strenuous exercise (like a brisk walk)?: 0 days    On average, how many minutes do you engage in exercise at this level?: 0 min  Stress: No Stress Concern Present (12/08/2020)   Received from [facility] visits prior to 05/01/2022.   [employer] of Occupational Health - Occupational Stress Questionnaire    Feeling of Stress : Not at all  Social Connections: Socially Isolated (12/08/2020)   Received from [facility] visits prior to 05/01/2022.   Social Connection and Isolation Panel    In a typical week, how many times do you talk on the phone with family, friends, or neighbors?: Never    How often do you get together with friends or relatives?: Once a week    How often do you attend church or religious services?: Never    Do you belong to any clubs or organizations such as church groups, unions,  fraternal or athletic groups, or school groups?: No    How often do you attend meetings of the clubs or organizations you belong to?: Never    Are you married, widowed, divorced, separated, never married, or living with a partner?: Married  Intimate Partner Violence: Not At Risk (12/08/2020)   Received from [facility] visits prior to 05/01/2022.   Humiliation, Afraid, Rape, and Kick questionnaire    Within the last year, have you been afraid of your partner or ex-partner?: No    Within the last year, have you been humiliated or emotionally abused in other ways by your partner or ex-partner?: No    Within the last year, have you been kicked, hit, slapped, or otherwise physically hurt by your partner or ex-partner?: No    Within the last year, have you been raped or forced to have any kind of sexual activity by your partner or ex-partner?: No     Physical Exam   Vitals:   02/05/24 0039  BP: (!) 171/71  Pulse: 92  Resp: 17  Temp: 98.2 F (36.8 C)  SpO2: 100%    CONSTITUTIONAL: Well-appearing, NAD NEURO/PSYCH:  Alert and oriented x 3, no focal deficits EYES:  eyes equal and reactive ENT/NECK:  no LAD, no JVD CARDIO: Regular rate, well-perfused, normal S1 and S2 PULM:  CTAB no wheezing or rhonchi GI/GU:  non-distended, non-tender MSK/SPINE:  No gross deformities, no edema SKIN: Erythematous rash beneath the pannus and left breast   *Additional and/or pertinent findings included in MDM below  Diagnostic and Interventional Summary    EKG Interpretation Date/Time:    Ventricular Rate:    PR Interval:    QRS Duration:    QT Interval:    QTC Calculation:   R Axis:      Text Interpretation:         Labs Reviewed  CBC WITH DIFFERENTIAL/PLATELET - Abnormal; Notable for the following components:      Result Value   WBC 11.6 (*)    RBC 3.76 (*)    Hemoglobin 10.7 (*)    HCT 34.8 (*)    Neutro Abs 9.3 (*)    Abs Immature Granulocytes 0.08 (*)    All  other components within normal limits  BASIC METABOLIC PANEL WITH GFR - Abnormal; Notable for the following components:   CO2 16 (*)    Glucose, Bld 200 (*)    BUN 40 (*)    Creatinine, Ser 2.16 (*)    Calcium  8.6 (*)    GFR, Estimated 23 (*)    All other components within normal limits  URINALYSIS, ROUTINE W REFLEX MICROSCOPIC - Abnormal; Notable for the following components:   APPearance CLOUDY (*)    Hgb urine dipstick LARGE (*)    Protein, ur 100 (*)    Nitrite POSITIVE (*)    Leukocytes,Ua LARGE (*)    Bacteria, UA MANY (*)    All other components within normal limits  URINE CULTURE    No orders to display    Medications  HYDROcodone -acetaminophen  (NORCO/VICODIN) 5-325 MG per tablet 1 tablet (1 tablet Oral Given 02/05/24 0151)     Procedures  /  Critical Care Procedures  ED Course and Medical Decision Making  Initial Impression and Ddx Mechanical fall without significant complaints.  Leg is a bit painful, range of motion is slightly inhibited due to pain.  I suggested x-ray however patient declines at this time, she does not think it is broken.  Overall I have a low suspicion and so patient advised to keep a close eye on her knee pain and seek medical care if pain is worsening or not going away over the next few days.  Candidal rash pretty diffusely beneath the pannus  but I see no other concerning features to suggest bacterial infection or deeper space infection.  Past medical/surgical history that increases complexity of ED encounter: Hypertension, diabetes  Interpretation of Diagnostics I personally reviewed the Laboratory Testing and my interpretation is as follows: No significant blood count or electrolyte disturbance.  Urinalysis likely contaminated but nitrate positive, patient not having any dysuria, will send for culture.    Patient Reassessment and Ultimate Disposition/Management     Discharge  Patient management required discussion with the following services  or consulting groups:  None  Complexity of Problems Addressed Acute illness or injury that poses threat of life of bodily function  Additional Data Reviewed and Analyzed Further history obtained from: Further history from spouse/family member  Additional Factors Impacting ED Encounter Risk Consideration of hospitalization  Ozell HERO. Theadore, MD [facility] Emergency Medicine [facility] [email or web address]  Final Clinical Impressions(s) / ED Diagnoses     ICD-10-CM   1. Fall, initial encounter  W19.XXXA     2. Acute pain of left knee  M25.562       ED Discharge Orders          Ordered    nystatin  (MYCOSTATIN /NYSTOP ) powder  3 times daily        02/05/24 0233             Discharge Instructions Discussed with and Provided to Patient:    Discharge Instructions      You were evaluated in the Emergency Department and after careful evaluation, we did not find any emergent condition requiring admission or further testing in the hospital.  Your exam/testing today is overall reassuring.  You may have some soreness from the fall.  We also noticed that you likely have a fungal rash.  Use the nystatin  powder as directed on your rash.  Follow-up with your primary care doctor.  Please return to the Emergency Department if you experience any worsening of your condition.   Thank you for allowing us  to be a part of your care.      Theadore Ozell HERO, MD 02/05/24 [phone]

## 2024-02-07 ENCOUNTER — Other Ambulatory Visit: Payer: Self-pay | Admitting: Cardiology

## 2024-02-07 LAB — URINE CULTURE: Culture: >=100000 — AB

## 2024-02-08 ENCOUNTER — Telehealth (HOSPITAL_BASED_OUTPATIENT_CLINIC_OR_DEPARTMENT_OTHER): Payer: Self-pay

## 2024-02-08 NOTE — Telephone Encounter (Signed)
 Post ED Visit - Positive Culture Follow-up  Culture report reviewed by antimicrobial stewardship pharmacist: [facility] [x]  [place], [place].D. []  Venetia Gully, Pharm.D., BCPS AQ-ID []  Garrel Crews, Pharm.D., BCPS []  Almarie Lunger, [address].D., BCPS []  [place], [address].D., BCPS, AAHIVP []  Rosaline Bihari, Pharm.D., BCPS, AAHIVP []  Vernell Meier, PharmD, BCPS []  Latanya Hint, PharmD, BCPS []  Donald Medley, PharmD, BCPS []  Rocky Bold, PharmD []  Dorothyann Alert, PharmD, BCPS []  Morene Babe, PharmD  [facility] []  Rosaline Edison, PharmD []  Romona Bliss, PharmD []  Dolphus Roller, PharmD []  Veva Seip, Rph []  Vernell Daunt) Leonce, PharmD []  Eva Allis, PharmD []  Rosaline Millet, PharmD []  Iantha Batch, PharmD []  Arvin Gauss, PharmD []  Wanda Hasting, PharmD []  Ronal Rav, PharmD []  Rocky Slade, PharmD []  Bard Jeans, PharmD   Positive urine culture Not treated, 78 yo present with fall. No urinary s/s noted or concern for UTI. No further patient follow-up is required at this time.  Kaitlin Berry 02/08/2024, 9:34 AM

## 2024-02-09 MED ORDER — CARVEDILOL 6.25 MG PO TABS: 6.2500 mg | ORAL_TABLET | Freq: Two times a day (BID) | ORAL | 2 refills | Status: DC

## 2024-02-14 MED ORDER — CARVEDILOL 6.25 MG PO TABS: 6.2500 mg | ORAL_TABLET | Freq: Two times a day (BID) | ORAL | 2 refills | Status: AC

## 2024-02-27 ENCOUNTER — Ambulatory Visit: Admitting: Family Medicine

## 2024-03-30 ENCOUNTER — Ambulatory Visit: Admitting: Nurse Practitioner
